# Patient Record
Sex: Female | Born: 1935 | Race: White | Hispanic: No | Marital: Married | Smoking: Former smoker
Health system: Southern US, Community
[De-identification: ages and names within clinical notes are randomized; demographics above are authoritative.]

## PROBLEM LIST (undated history)

## (undated) DIAGNOSIS — J45909 Unspecified asthma, uncomplicated: Secondary | ICD-10-CM

## (undated) DIAGNOSIS — Z923 Personal history of irradiation: Secondary | ICD-10-CM

## (undated) DIAGNOSIS — J449 Chronic obstructive pulmonary disease, unspecified: Secondary | ICD-10-CM

## (undated) DIAGNOSIS — F32A Depression, unspecified: Secondary | ICD-10-CM

## (undated) DIAGNOSIS — F419 Anxiety disorder, unspecified: Secondary | ICD-10-CM

## (undated) DIAGNOSIS — F329 Major depressive disorder, single episode, unspecified: Secondary | ICD-10-CM

## (undated) DIAGNOSIS — C50919 Malignant neoplasm of unspecified site of unspecified female breast: Secondary | ICD-10-CM

## (undated) HISTORY — PX: CHOLECYSTECTOMY: SHX55

## (undated) HISTORY — DX: Chronic obstructive pulmonary disease, unspecified: J44.9

## (undated) HISTORY — DX: Anxiety disorder, unspecified: F41.9

## (undated) HISTORY — DX: Depression, unspecified: F32.A

## (undated) HISTORY — DX: Major depressive disorder, single episode, unspecified: F32.9

## (undated) HISTORY — DX: Unspecified asthma, uncomplicated: J45.909

## (undated) HISTORY — PX: ABDOMINAL HYSTERECTOMY: SHX81

## (undated) HISTORY — PX: BACK SURGERY: SHX140

## (undated) HISTORY — PX: COLONOSCOPY: SHX174

## (undated) HISTORY — PX: APPENDECTOMY: SHX54

---

## 1973-01-21 HISTORY — PX: BREAST BIOPSY: SHX20

## 1973-01-21 HISTORY — PX: BREAST LUMPECTOMY: SHX2

## 1973-01-21 HISTORY — PX: BREAST EXCISIONAL BIOPSY: SUR124

## 2013-01-21 HISTORY — PX: BREAST BIOPSY: SHX20

## 2013-01-21 HISTORY — PX: BREAST LUMPECTOMY: SHX2

## 2013-11-02 DIAGNOSIS — C50411 Malignant neoplasm of upper-outer quadrant of right female breast: Secondary | ICD-10-CM | POA: Insufficient documentation

## 2013-11-02 DIAGNOSIS — C50911 Malignant neoplasm of unspecified site of right female breast: Secondary | ICD-10-CM | POA: Insufficient documentation

## 2013-11-02 DIAGNOSIS — Z853 Personal history of malignant neoplasm of breast: Secondary | ICD-10-CM | POA: Insufficient documentation

## 2016-06-10 DIAGNOSIS — Z87898 Personal history of other specified conditions: Secondary | ICD-10-CM | POA: Insufficient documentation

## 2016-06-10 DIAGNOSIS — R933 Abnormal findings on diagnostic imaging of other parts of digestive tract: Secondary | ICD-10-CM | POA: Insufficient documentation

## 2016-06-10 DIAGNOSIS — R634 Abnormal weight loss: Secondary | ICD-10-CM | POA: Insufficient documentation

## 2016-07-02 DIAGNOSIS — E7800 Pure hypercholesterolemia, unspecified: Secondary | ICD-10-CM | POA: Insufficient documentation

## 2016-07-02 DIAGNOSIS — K219 Gastro-esophageal reflux disease without esophagitis: Secondary | ICD-10-CM | POA: Insufficient documentation

## 2016-07-02 DIAGNOSIS — J449 Chronic obstructive pulmonary disease, unspecified: Secondary | ICD-10-CM | POA: Insufficient documentation

## 2016-07-02 DIAGNOSIS — E039 Hypothyroidism, unspecified: Secondary | ICD-10-CM | POA: Insufficient documentation

## 2016-07-02 DIAGNOSIS — F325 Major depressive disorder, single episode, in full remission: Secondary | ICD-10-CM | POA: Insufficient documentation

## 2016-07-02 DIAGNOSIS — M5416 Radiculopathy, lumbar region: Secondary | ICD-10-CM | POA: Insufficient documentation

## 2016-07-02 DIAGNOSIS — M8589 Other specified disorders of bone density and structure, multiple sites: Secondary | ICD-10-CM | POA: Insufficient documentation

## 2016-07-02 DIAGNOSIS — G8929 Other chronic pain: Secondary | ICD-10-CM | POA: Insufficient documentation

## 2016-07-16 ENCOUNTER — Inpatient Hospital Stay: Payer: Medicare Other

## 2016-07-16 ENCOUNTER — Inpatient Hospital Stay: Payer: Medicare Other | Attending: Oncology | Admitting: Oncology

## 2016-07-16 ENCOUNTER — Encounter: Payer: Self-pay | Admitting: Oncology

## 2016-07-16 ENCOUNTER — Encounter (INDEPENDENT_AMBULATORY_CARE_PROVIDER_SITE_OTHER): Payer: Self-pay

## 2016-07-16 VITALS — BP 130/71 | HR 83 | Temp 97.8°F | Resp 18 | Ht 60.0 in | Wt 118.2 lb

## 2016-07-16 DIAGNOSIS — K219 Gastro-esophageal reflux disease without esophagitis: Secondary | ICD-10-CM | POA: Diagnosis not present

## 2016-07-16 DIAGNOSIS — C50911 Malignant neoplasm of unspecified site of right female breast: Secondary | ICD-10-CM

## 2016-07-16 DIAGNOSIS — Z170 Estrogen receptor positive status [ER+]: Principal | ICD-10-CM

## 2016-07-16 DIAGNOSIS — Z8781 Personal history of (healed) traumatic fracture: Secondary | ICD-10-CM | POA: Diagnosis not present

## 2016-07-16 DIAGNOSIS — Z87891 Personal history of nicotine dependence: Secondary | ICD-10-CM | POA: Diagnosis not present

## 2016-07-16 DIAGNOSIS — M8580 Other specified disorders of bone density and structure, unspecified site: Secondary | ICD-10-CM

## 2016-07-16 DIAGNOSIS — Z8719 Personal history of other diseases of the digestive system: Secondary | ICD-10-CM | POA: Insufficient documentation

## 2016-07-16 DIAGNOSIS — E039 Hypothyroidism, unspecified: Secondary | ICD-10-CM | POA: Diagnosis not present

## 2016-07-16 DIAGNOSIS — F419 Anxiety disorder, unspecified: Secondary | ICD-10-CM | POA: Diagnosis not present

## 2016-07-16 DIAGNOSIS — Z79899 Other long term (current) drug therapy: Secondary | ICD-10-CM | POA: Diagnosis not present

## 2016-07-16 DIAGNOSIS — Z923 Personal history of irradiation: Secondary | ICD-10-CM | POA: Insufficient documentation

## 2016-07-16 DIAGNOSIS — R634 Abnormal weight loss: Secondary | ICD-10-CM | POA: Diagnosis not present

## 2016-07-16 DIAGNOSIS — J449 Chronic obstructive pulmonary disease, unspecified: Secondary | ICD-10-CM | POA: Diagnosis not present

## 2016-07-16 DIAGNOSIS — M5416 Radiculopathy, lumbar region: Secondary | ICD-10-CM | POA: Diagnosis not present

## 2016-07-16 DIAGNOSIS — Z79810 Long term (current) use of selective estrogen receptor modulators (SERMs): Secondary | ICD-10-CM | POA: Diagnosis not present

## 2016-07-16 LAB — CBC WITH DIFFERENTIAL/PLATELET
BASOS ABS: 0 10*3/uL (ref 0–0.1)
BASOS PCT: 0 %
EOS ABS: 0 10*3/uL (ref 0–0.7)
Eosinophils Relative: 0 %
HCT: 37.7 % (ref 35.0–47.0)
HEMOGLOBIN: 12.9 g/dL (ref 12.0–16.0)
LYMPHS ABS: 1.3 10*3/uL (ref 1.0–3.6)
Lymphocytes Relative: 13 %
MCH: 32.4 pg (ref 26.0–34.0)
MCHC: 34.3 g/dL (ref 32.0–36.0)
MCV: 94.7 fL (ref 80.0–100.0)
Monocytes Absolute: 0.8 10*3/uL (ref 0.2–0.9)
Monocytes Relative: 8 %
NEUTROS PCT: 79 %
Neutro Abs: 7.9 10*3/uL — ABNORMAL HIGH (ref 1.4–6.5)
Platelets: 291 10*3/uL (ref 150–440)
RBC: 3.98 MIL/uL (ref 3.80–5.20)
RDW: 13.6 % (ref 11.5–14.5)
WBC: 10.2 10*3/uL (ref 3.6–11.0)

## 2016-07-16 LAB — COMPREHENSIVE METABOLIC PANEL
ALBUMIN: 4.2 g/dL (ref 3.5–5.0)
ALK PHOS: 42 U/L (ref 38–126)
ALT: 19 U/L (ref 14–54)
AST: 28 U/L (ref 15–41)
Anion gap: 9 (ref 5–15)
BUN: 21 mg/dL — ABNORMAL HIGH (ref 6–20)
CALCIUM: 9.1 mg/dL (ref 8.9–10.3)
CHLORIDE: 102 mmol/L (ref 101–111)
CO2: 26 mmol/L (ref 22–32)
CREATININE: 0.9 mg/dL (ref 0.44–1.00)
GFR calc Af Amer: >60 mL/min (ref >60)
GFR calc non Af Amer: 59 mL/min — ABNORMAL LOW (ref >60)
GLUCOSE: 99 mg/dL (ref 65–99)
Potassium: 4.2 mmol/L (ref 3.5–5.1)
SODIUM: 137 mmol/L (ref 135–145)
Total Bilirubin: 0.5 mg/dL (ref 0.3–1.2)
Total Protein: 7.4 g/dL (ref 6.5–8.1)

## 2016-07-16 NOTE — Progress Notes (Signed)
Patient here today as new evaluation regarding weight loss.  Referred by Dawson Bills, [facility].

## 2016-07-16 NOTE — Progress Notes (Signed)
Hematology/Oncology Consult note [facility] Telephone:([phone] [phone]  Patient Care Team: Glendon Axe, MD as PCP - General (Internal Medicine)   Name of the patient: Julie Mckinney  [id number]  1934/06/21    Reason for referral- history of breast cancer and abnormal weight loss   Referring physician- Dawson Bills NP  Date of visit: 07/16/16   History of presenting illness- 1. Patient is a 82 year old female with a history of invasive lobular carcinoma of the right breast diagnosed in 2015 lumpectomy and adjuvant radiation therapy. She was found to have a 6 x 5 x 10 mm mass in her right breast on core biopsy that was ER/PR positive and HER-2/neu negative. She underwent lumpectomy and sentinel lymph node biopsy in November 2015 which showed T2 N0 invasive lobular carcinoma measuring 2.6 cm, low-grade. 0 out of 4 sentinel lymph nodes were involved. Focally positive margin with involvement of the pectoralis muscle alone. Reexcision was not pursued and she received radiation boost to this area. She was subsequently started on hormone therapy with letrozole but had problems tolerating it due to myalgias and arthralgias and is currently on tamoxifen. She has had osteopenia in the past and has had nontraumatic bilateral were fractures despite taking Fosamax. Last mammogram from 2017 was reportedly normal and she is due for one this year. She had bone density scan yesterday and I do not have those results with me today  2. She recently moved here from [place] and notes that sometime in April 2018 she was in the hospital with severe diarrhea. She didn't know was weight from 135 pounds to 115 pounds. She is now back up to 118 pounds and is slowly trying to gain weight. She has been seen at [facility] GI for the same and is scheduled to undergo EGD and colonoscopy next month. She also had a recent CT abdomen (I do not have those results with me to review)  but notes from Dr. Durenda Age anxiety and that she had diverticulosis noted on CT abdomen but no evidence of malignancy.  3. Patient also has a history of COPD due to prior smoking but is not currently on home oxygen. She is independent of her ADLs and IADLs  ECOG PS- 0  Pain scale- 0   Review of systems- Review of Systems  Constitutional: Negative for chills, fever, malaise/fatigue and weight loss.  HENT: Negative for congestion, ear discharge and nosebleeds.   Eyes: Negative for blurred vision.  Respiratory: Negative for cough, hemoptysis, sputum production, shortness of breath and wheezing.   Cardiovascular: Negative for chest pain, palpitations, orthopnea and claudication.  Gastrointestinal: Negative for abdominal pain, blood in stool, constipation, diarrhea, heartburn, melena, nausea and vomiting.  Genitourinary: Negative for dysuria, flank pain, frequency, hematuria and urgency.  Musculoskeletal: Negative for back pain, joint pain and myalgias.  Skin: Negative for rash.  Neurological: Negative for dizziness, tingling, focal weakness, seizures, weakness and headaches.  Endo/Heme/Allergies: Does not bruise/bleed easily.  Psychiatric/Behavioral: Negative for depression and suicidal ideas. The patient does not have insomnia.     Allergies  Allergen Reactions  . Tape Rash    Patient Active Problem List   Diagnosis Date Noted  . Acquired hypothyroidism 07/02/2016  . Chronic obstructive pulmonary disease ([place]) 07/02/2016  . Depression, major, single episode, complete remission ([place]) 07/02/2016  . Gastroesophageal reflux disease without esophagitis 07/02/2016  . Lumbar radiculopathy 07/02/2016  . Osteopenia of multiple sites 07/02/2016  . History of diarrhea 06/10/2016  . Weight loss, abnormal  06/10/2016     Past Medical History:  Diagnosis Date  . Asthma   . COPD (chronic obstructive pulmonary disease) ([place])      Past Surgical History:  Procedure Laterality Date  .  ABDOMINAL HYSTERECTOMY    . APPENDECTOMY    . BREAST LUMPECTOMY  1975  . CHOLECYSTECTOMY    . COLONOSCOPY     Scheduled for July 2018    Social History   Social History  . Marital status: Married    Spouse name: N/A  . Number of children: N/A  . Years of education: N/A   Occupational History  . Not on file.   Social History Main Topics  . Smoking status: Former Smoker    Packs/day: 0.50    Years: 30.00    Types: Cigarettes    Quit date: 06/22/1995  . Smokeless tobacco: Not on file  . Alcohol use Yes     Comment: Occasional  . Drug use: No  . Sexual activity: Not on file   Other Topics Concern  . Not on file   Social History Narrative  . No narrative on file     Family History  Problem Relation Age of Onset  . Cancer Father      Current Outpatient Prescriptions:  .  albuterol (PROVENTIL HFA;VENTOLIN HFA) 108 (90 Base) MCG/ACT inhaler, Inhale 2 puffs into the lungs every 6 (six) hours as needed., Disp: , Rfl:  .  calcium carbonate (CALCIUM 600) 600 MG TABS tablet, Take 600 mg by mouth 2 (two) times daily., Disp: , Rfl:  .  Cholecalciferol (VITAMIN D3) 1000 units CAPS, Take 1,000 mg by mouth daily., Disp: , Rfl:  .  diclofenac (VOLTAREN) 75 MG EC tablet, Take 75 mg by mouth 2 (two) times daily., Disp: , Rfl:  .  fluticasone (FLONASE) 50 MCG/ACT nasal spray, Place 1 spray into the nose as needed., Disp: , Rfl:  .  Fluticasone Furoate-Vilanterol (BREO ELLIPTA IN), Inhale 1 puff into the lungs daily., Disp: , Rfl:  .  GLUCOSAMINE SULFATE PO, Take 1 capsule by mouth 2 (two) times daily., Disp: , Rfl:  .  levothyroxine (SYNTHROID, LEVOTHROID) 50 MCG tablet, Take 50 mcg by mouth daily., Disp: , Rfl:  .  meclizine (ANTIVERT) 12.5 MG tablet, Take 12.5 mg by mouth at bedtime., Disp: , Rfl:  .  mirtazapine (REMERON) 45 MG tablet, Take 45 mg by mouth at bedtime., Disp: , Rfl:  .  Multiple Vitamin (MULTI-VITAMINS) TABS, Take 1 tablet by mouth daily., Disp: , Rfl:  .  omeprazole  (PRILOSEC) 20 MG capsule, Take 20 mg by mouth daily., Disp: , Rfl:  .  simvastatin (ZOCOR) 80 MG tablet, Take 80 mg by mouth daily., Disp: , Rfl:  .  tamoxifen (NOLVADEX) 20 MG tablet, Take 20 mg by mouth daily., Disp: , Rfl:  .  tiotropium (SPIRIVA) 18 MCG inhalation capsule, Place 1 capsule into inhaler and inhale as needed., Disp: , Rfl:  .  traMADol (ULTRAM) 50 MG tablet, Take 5 mg by mouth as needed., Disp: , Rfl:  .  Venlafaxine HCl 225 MG TB24, Take 2.25 mg by mouth 2 (two) times daily., Disp: , Rfl:    Physical exam:  Vitals:   07/16/16 1100  BP: 130/71  Pulse: 83  Resp: 18  Temp: 97.8 F (36.6 C)  TempSrc: Tympanic  Weight: 118 lb 4 oz (53.6 kg)  Height: 5' (1.524 m)   Physical Exam  Constitutional: She is oriented to person, place, and time  and well-developed, well-nourished, and in no distress.  HENT:  Head: Normocephalic and atraumatic.  Eyes: EOM are normal. Pupils are equal, round, and reactive to light.  Neck: Normal range of motion.  Cardiovascular: Normal rate, regular rhythm and normal heart sounds.   Pulmonary/Chest: Effort normal and breath sounds normal.  Abdominal: Soft. Bowel sounds are normal.  Musculoskeletal: She exhibits edema (Trace bilateral).  Neurological: She is alert and oriented to person, place, and time.  Skin: Skin is warm and dry.   Breast exam was performed in seated and lying down position. Patient is status post right lumpectomy with a well-healed surgical scar. No evidence of any palpable masses. No evidence of axillary adenopathy. No evidence of any palpable masses or lumps in the left breast. No evidence of leftt axillary adenopathy     CMP Latest Ref Rng & Units 07/16/2016  Glucose 65 - 99 mg/dL 99  BUN 6 - 20 mg/dL 21(H)  Creatinine 0.44 - 1.00 mg/dL 0.90  Sodium 135 - 145 mmol/L 137  Potassium 3.5 - 5.1 mmol/L 4.2  Chloride 101 - 111 mmol/L 102  CO2 22 - 32 mmol/L 26  Calcium 8.9 - 10.3 mg/dL 9.1  Total Protein 6.5 - 8.1  g/dL 7.4  Total Bilirubin 0.3 - 1.2 mg/dL 0.5  Alkaline Phos 38 - 126 U/L 42  AST 15 - 41 U/L 28  ALT 14 - 54 U/L 19   CBC Latest Ref Rng & Units 07/16/2016  WBC 3.6 - 11.0 K/uL 10.2  Hemoglobin 12.0 - 16.0 g/dL 12.9  Hematocrit 35.0 - 47.0 % 37.7  Platelets 150 - 440 K/uL 291     Assessment and plan- Patient is a 82 y.o. female with a history of right breast cancer in 2015 status post lumpectomy and radiation and currently on tamoxifen  1. Right breast cancer status post lumpectomy and radiation and currently on tamoxifen: She will continue tamoxifen for total period of 5 years ending in 2021. We will obtain results of bone density scan done yesterday. She is also status post hysterectomy and is not an increased risk of uterine cancer. We will schedule a mammogram at this time  2. Abnormal weight loss: Patient reports that her diarrhea has now resolved and she is slowly regaining her weight. She denies any overt fatigue and clinically there is no evidence of recurrence on today's exam. I will check a CBC and CMP and await her colonoscopy and EGD next month. I will see her back in 3 months time. If she continues to lose weight or does not regain her lost weight I will consider doing a CT thorax without contrast given her history of COPD in the past to rule out any malignancy. Recent CT abdomen did not show any evidence of malignancy  > 30 min spent in reviewing outside patient notes on breast cancer and recent GI evaluation   Thank you for this kind referral and the opportunity to participate in the care of this patient   Visit Diagnosis 1. Malignant neoplasm of right breast in female, estrogen receptor positive, unspecified site of breast [facility])     Dr. Randa Evens, MD, MPH [facility] Pager- [phone] 07/16/2016 1:04 PM

## 2016-07-17 ENCOUNTER — Other Ambulatory Visit: Payer: Self-pay | Admitting: *Deleted

## 2016-07-17 DIAGNOSIS — C50911 Malignant neoplasm of unspecified site of right female breast: Secondary | ICD-10-CM

## 2016-07-17 DIAGNOSIS — Z170 Estrogen receptor positive status [ER+]: Principal | ICD-10-CM

## 2016-07-22 ENCOUNTER — Inpatient Hospital Stay
Admission: RE | Admit: 2016-07-22 | Discharge: 2016-07-22 | Disposition: A | Discharge status: Home / Self Care | Payer: Self-pay | Source: Ambulatory Visit | Attending: *Deleted | Admitting: *Deleted

## 2016-07-22 ENCOUNTER — Other Ambulatory Visit: Payer: Self-pay | Admitting: *Deleted

## 2016-07-22 DIAGNOSIS — Z9289 Personal history of other medical treatment: Secondary | ICD-10-CM

## 2016-07-29 ENCOUNTER — Ambulatory Visit
Admission: RE | Admit: 2016-07-29 | Discharge: 2016-07-29 | Disposition: A | Discharge status: Home / Self Care | Payer: Medicare Other | Source: Ambulatory Visit | Attending: Oncology | Admitting: Oncology

## 2016-07-29 ENCOUNTER — Encounter (HOSPITAL_COMMUNITY): Payer: Self-pay

## 2016-07-29 DIAGNOSIS — C50911 Malignant neoplasm of unspecified site of right female breast: Secondary | ICD-10-CM

## 2016-07-29 DIAGNOSIS — Z853 Personal history of malignant neoplasm of breast: Secondary | ICD-10-CM | POA: Insufficient documentation

## 2016-07-29 DIAGNOSIS — Z923 Personal history of irradiation: Secondary | ICD-10-CM | POA: Insufficient documentation

## 2016-07-29 DIAGNOSIS — Z170 Estrogen receptor positive status [ER+]: Secondary | ICD-10-CM

## 2016-07-29 HISTORY — DX: Malignant neoplasm of unspecified site of unspecified female breast: C50.919

## 2016-07-29 HISTORY — DX: Personal history of irradiation: Z92.3

## 2016-08-13 ENCOUNTER — Encounter: Payer: Self-pay | Admitting: *Deleted

## 2016-08-14 ENCOUNTER — Ambulatory Visit
Admission: RE | Admit: 2016-08-14 | Discharge: 2016-08-14 | Disposition: A | Discharge status: Home / Self Care | Payer: Medicare Other | Source: Ambulatory Visit | Attending: Unknown Physician Specialty | Admitting: Unknown Physician Specialty

## 2016-08-14 ENCOUNTER — Encounter: Payer: Self-pay | Admitting: Anesthesiology

## 2016-08-14 ENCOUNTER — Ambulatory Visit: Payer: Medicare Other | Admitting: Anesthesiology

## 2016-08-14 ENCOUNTER — Encounter
Admission: RE | Disposition: A | Discharge status: Home / Self Care | Payer: Self-pay | Source: Ambulatory Visit | Attending: Unknown Physician Specialty

## 2016-08-14 DIAGNOSIS — Z79899 Other long term (current) drug therapy: Secondary | ICD-10-CM | POA: Insufficient documentation

## 2016-08-14 DIAGNOSIS — K449 Diaphragmatic hernia without obstruction or gangrene: Secondary | ICD-10-CM | POA: Diagnosis not present

## 2016-08-14 DIAGNOSIS — K5730 Diverticulosis of large intestine without perforation or abscess without bleeding: Secondary | ICD-10-CM | POA: Diagnosis not present

## 2016-08-14 DIAGNOSIS — K219 Gastro-esophageal reflux disease without esophagitis: Secondary | ICD-10-CM | POA: Diagnosis not present

## 2016-08-14 DIAGNOSIS — K5520 Angiodysplasia of colon without hemorrhage: Secondary | ICD-10-CM | POA: Insufficient documentation

## 2016-08-14 DIAGNOSIS — Z9049 Acquired absence of other specified parts of digestive tract: Secondary | ICD-10-CM | POA: Insufficient documentation

## 2016-08-14 DIAGNOSIS — F329 Major depressive disorder, single episode, unspecified: Secondary | ICD-10-CM | POA: Insufficient documentation

## 2016-08-14 DIAGNOSIS — K2950 Unspecified chronic gastritis without bleeding: Secondary | ICD-10-CM | POA: Diagnosis not present

## 2016-08-14 DIAGNOSIS — Z853 Personal history of malignant neoplasm of breast: Secondary | ICD-10-CM | POA: Insufficient documentation

## 2016-08-14 DIAGNOSIS — M8580 Other specified disorders of bone density and structure, unspecified site: Secondary | ICD-10-CM | POA: Diagnosis not present

## 2016-08-14 DIAGNOSIS — E039 Hypothyroidism, unspecified: Secondary | ICD-10-CM | POA: Diagnosis not present

## 2016-08-14 DIAGNOSIS — Z923 Personal history of irradiation: Secondary | ICD-10-CM | POA: Insufficient documentation

## 2016-08-14 DIAGNOSIS — K31819 Angiodysplasia of stomach and duodenum without bleeding: Secondary | ICD-10-CM | POA: Insufficient documentation

## 2016-08-14 DIAGNOSIS — Z8042 Family history of malignant neoplasm of prostate: Secondary | ICD-10-CM | POA: Insufficient documentation

## 2016-08-14 DIAGNOSIS — Z801 Family history of malignant neoplasm of trachea, bronchus and lung: Secondary | ICD-10-CM | POA: Insufficient documentation

## 2016-08-14 DIAGNOSIS — Z6822 Body mass index (BMI) 22.0-22.9, adult: Secondary | ICD-10-CM | POA: Insufficient documentation

## 2016-08-14 DIAGNOSIS — Z87891 Personal history of nicotine dependence: Secondary | ICD-10-CM | POA: Diagnosis not present

## 2016-08-14 DIAGNOSIS — K640 First degree hemorrhoids: Secondary | ICD-10-CM | POA: Diagnosis not present

## 2016-08-14 DIAGNOSIS — Z820 Family history of epilepsy and other diseases of the nervous system: Secondary | ICD-10-CM | POA: Diagnosis not present

## 2016-08-14 DIAGNOSIS — Z90710 Acquired absence of both cervix and uterus: Secondary | ICD-10-CM | POA: Insufficient documentation

## 2016-08-14 DIAGNOSIS — R197 Diarrhea, unspecified: Secondary | ICD-10-CM | POA: Insufficient documentation

## 2016-08-14 DIAGNOSIS — E7800 Pure hypercholesterolemia, unspecified: Secondary | ICD-10-CM | POA: Diagnosis not present

## 2016-08-14 DIAGNOSIS — R634 Abnormal weight loss: Secondary | ICD-10-CM | POA: Insufficient documentation

## 2016-08-14 DIAGNOSIS — M5416 Radiculopathy, lumbar region: Secondary | ICD-10-CM | POA: Insufficient documentation

## 2016-08-14 DIAGNOSIS — J449 Chronic obstructive pulmonary disease, unspecified: Secondary | ICD-10-CM | POA: Diagnosis not present

## 2016-08-14 HISTORY — PX: COLONOSCOPY WITH PROPOFOL: SHX5780

## 2016-08-14 HISTORY — PX: ESOPHAGOGASTRODUODENOSCOPY (EGD) WITH PROPOFOL: SHX5813

## 2016-08-14 SURGERY — ESOPHAGOGASTRODUODENOSCOPY (EGD) WITH PROPOFOL
Anesthesia: General

## 2016-08-14 MED ORDER — LIDOCAINE HCL (PF) 2 % IJ SOLN
INTRAMUSCULAR | Status: AC
  Filled 2016-08-14: qty 4

## 2016-08-14 MED ORDER — FENTANYL CITRATE (PF) 100 MCG/2ML IJ SOLN
INTRAMUSCULAR | Status: AC
  Filled 2016-08-14: qty 2

## 2016-08-14 MED ORDER — PROPOFOL 10 MG/ML IV BOLUS
INTRAVENOUS | Status: DC | PRN
  Administered 2016-08-14: 80 mg via INTRAVENOUS

## 2016-08-14 MED ORDER — PROPOFOL 10 MG/ML IV BOLUS
INTRAVENOUS | Status: AC
  Filled 2016-08-14: qty 20

## 2016-08-14 MED ORDER — PHENYLEPHRINE HCL 10 MG/ML IJ SOLN
INTRAMUSCULAR | Status: DC | PRN
  Administered 2016-08-14 (×2): 100 ug via INTRAVENOUS

## 2016-08-14 MED ORDER — MIDAZOLAM HCL 5 MG/5ML IJ SOLN
INTRAMUSCULAR | Status: DC | PRN
  Administered 2016-08-14: 1 mg via INTRAVENOUS

## 2016-08-14 MED ORDER — MIDAZOLAM HCL 2 MG/2ML IJ SOLN
INTRAMUSCULAR | Status: AC
  Filled 2016-08-14: qty 2

## 2016-08-14 MED ORDER — LIDOCAINE 2% (20 MG/ML) 5 ML SYRINGE
INTRAMUSCULAR | Status: DC | PRN
  Administered 2016-08-14: 30 mg via INTRAVENOUS

## 2016-08-14 MED ORDER — SODIUM CHLORIDE 0.9 % IV SOLN: INTRAVENOUS | Status: DC

## 2016-08-14 MED ORDER — SODIUM CHLORIDE 0.9 % IV SOLN
INTRAVENOUS | Status: DC
  Administered 2016-08-14: 1000 mL via INTRAVENOUS
  Administered 2016-08-14: 11:00:00 via INTRAVENOUS

## 2016-08-14 MED ORDER — PROPOFOL 500 MG/50ML IV EMUL
INTRAVENOUS | Status: DC | PRN
  Administered 2016-08-14: 140 ug/kg/min via INTRAVENOUS

## 2016-08-14 MED ORDER — FENTANYL CITRATE (PF) 100 MCG/2ML IJ SOLN
INTRAMUSCULAR | Status: DC | PRN
  Administered 2016-08-14: 50 ug via INTRAVENOUS

## 2016-08-14 NOTE — Transfer of Care (Signed)
Immediate Anesthesia Transfer of Care Note  Patient: Julie Mckinney  Procedure(s) Performed: Procedure(s): ESOPHAGOGASTRODUODENOSCOPY (EGD) WITH PROPOFOL (N/A) COLONOSCOPY WITH PROPOFOL (N/A)  Patient Location: PACU and Endoscopy Unit  Anesthesia Type:General  Level of Consciousness: awake and patient cooperative  Airway & Oxygen Therapy: Patient Spontanous Breathing and Patient connected to nasal cannula oxygen  Post-op Assessment: Report given to RN and Post -op Vital signs reviewed and stable  Post vital signs: Reviewed and stable  Last Vitals:  Vitals:   08/14/16 1019  BP: 118/67  Pulse: 72  Resp: 16  Temp: 36.7 C    Last Pain: There were no vitals filed for this visit.       Complications: No apparent anesthesia complications

## 2016-08-14 NOTE — Op Note (Signed)
[facility] Gastroenterology Patient Name: Julie Mckinney Procedure Date: 08/14/2016 10:53 AM MRN: [id number] Account #: [id number] Date of Birth: [birth year] Admit Type: Outpatient Age: 82 Room: [facility] ENDO ROOM 3 Gender: Female Note Status: Finalized Procedure:            Upper GI endoscopy Indications:          Weight loss Providers:            Manya Silvas, MD Referring MD:         Glendon Axe (Referring MD) Medicines:            Propofol per Anesthesia Complications:        No immediate complications. Procedure:            Pre-Anesthesia Assessment:                       - After reviewing the risks and benefits, the patient                        was deemed in satisfactory condition to undergo the                        procedure.                       After obtaining informed consent, the endoscope was                        passed under direct vision. Throughout the procedure,                        the patient's blood pressure, pulse, and oxygen                        saturations were monitored continuously. The Endoscope                        was introduced through the mouth, and advanced to the                        second part of duodenum. The upper GI endoscopy was                        accomplished without difficulty. The patient tolerated                        the procedure well. Findings:      The examined esophagus was normal.      A small hiatal hernia was present.      The stomach was normal.      Localized mild inflammation characterized by erythema and granularity       was found in the gastric antrum. Biopsies were taken with a cold forceps       for histology. Biopsies were taken with a cold forceps for Helicobacter       pylori testing.      A single medium angioectasia without bleeding was found in the second       portion of the duodenum. Coagulation for tissue destruction using argon       plasma at 0.4 liters/minute and 20  watts was successful. Impression:           -  Normal esophagus.                       - Small hiatal hernia.                       - Normal stomach.                       - Gastritis. Biopsied.                       - A single non-bleeding angioectasia in the duodenum.                        Treated with argon plasma coagulation (APC). Recommendation:       - Await pathology results. Manya Silvas, MD 08/14/2016 11:22:28 AM This report has been signed electronically. Number of Addenda: 0 Note Initiated On: 08/14/2016 10:53 AM      [facility]

## 2016-08-14 NOTE — Anesthesia Postprocedure Evaluation (Signed)
Anesthesia Post Note  Patient: Julie Mckinney  Procedure(s) Performed: Procedure(s) (LRB): ESOPHAGOGASTRODUODENOSCOPY (EGD) WITH PROPOFOL (N/A) COLONOSCOPY WITH PROPOFOL (N/A)  Patient location during evaluation: Endoscopy Anesthesia Type: General Level of consciousness: awake and alert Pain management: pain level controlled Vital Signs Assessment: post-procedure vital signs reviewed and stable Respiratory status: spontaneous breathing, nonlabored ventilation, respiratory function stable and patient connected to nasal cannula oxygen Cardiovascular status: blood pressure returned to baseline and stable Postop Assessment: no signs of nausea or vomiting Anesthetic complications: no     Last Vitals:  Vitals:   08/14/16 1200 08/14/16 1210  BP: (!) 111/52 (!) 116/57  Pulse: 60   Resp: (!) 21   Temp:      Last Pain:  Vitals:   08/14/16 1151  TempSrc: Tympanic                 [name]

## 2016-08-14 NOTE — H&P (Signed)
Primary Care Physician:  Glendon Axe, MD Primary Gastroenterologist:  Dr. Vira Agar  Pre-Procedure History & Physical: HPI:  Julie Mckinney is a 82 y.o. female is here for an endoscopy and colonoscopy.   Past Medical History:  Diagnosis Date  . Asthma   . Breast cancer ([place]) Right  . COPD (chronic obstructive pulmonary disease) ([place])   . Personal history of radiation therapy     Past Surgical History:  Procedure Laterality Date  . ABDOMINAL HYSTERECTOMY    . APPENDECTOMY    . BREAST BIOPSY Right 2015   +  . BREAST BIOPSY Right 1975   neg  . BREAST LUMPECTOMY  1975  . CHOLECYSTECTOMY    . COLONOSCOPY     Scheduled for July 2018    Prior to Admission medications   Medication Sig Start Date End Date Taking? Authorizing Provider  albuterol (PROVENTIL HFA;VENTOLIN HFA) 108 (90 Base) MCG/ACT inhaler Inhale 2 puffs into the lungs every 6 (six) hours as needed.   Yes [provider]  calcium carbonate (CALCIUM 600) 600 MG TABS tablet Take 600 mg by mouth 2 (two) times daily.   Yes [provider]  Cholecalciferol (VITAMIN D3) 1000 units CAPS Take 1,000 mg by mouth daily.   Yes [provider]  diclofenac (VOLTAREN) 75 MG EC tablet Take 75 mg by mouth 2 (two) times daily.   Yes [provider]  fluticasone (FLONASE) 50 MCG/ACT nasal spray Place 1 spray into the nose as needed.   Yes [provider]  Fluticasone Furoate-Vilanterol (BREO ELLIPTA IN) Inhale 1 puff into the lungs daily.   Yes [provider]  GLUCOSAMINE SULFATE PO Take 1 capsule by mouth 2 (two) times daily.   Yes [provider]  levothyroxine (SYNTHROID, LEVOTHROID) 50 MCG tablet Take 50 mcg by mouth daily.   Yes [provider]  meclizine (ANTIVERT) 12.5 MG tablet Take 12.5 mg by mouth at bedtime. 03/21/16  Yes [provider]  mirtazapine (REMERON) 45 MG tablet Take 45 mg by mouth at bedtime.   Yes [provider]  Multiple  Vitamin (MULTI-VITAMINS) TABS Take 1 tablet by mouth daily.   Yes [provider]  omeprazole (PRILOSEC) 20 MG capsule Take 20 mg by mouth daily.   Yes [provider]  simvastatin (ZOCOR) 80 MG tablet Take 80 mg by mouth daily.   Yes [provider]  tamoxifen (NOLVADEX) 20 MG tablet Take 20 mg by mouth daily.   Yes [provider]  tiotropium (SPIRIVA) 18 MCG inhalation capsule Place 1 capsule into inhaler and inhale as needed.   Yes [provider]  traMADol (ULTRAM) 50 MG tablet Take 5 mg by mouth as needed.   Yes [provider]  Venlafaxine HCl 225 MG TB24 Take 2.25 mg by mouth 2 (two) times daily.   Yes [provider]    Allergies as of 07/03/2016  . (Not on File)    Family History  Problem Relation Age of Onset  . Cancer Father   . Breast cancer Neg Hx     Social History   Social History  . Marital status: Married    Spouse name: N/A  . Number of children: N/A  . Years of education: N/A   Occupational History  . Not on file.   Social History Main Topics  . Smoking status: Former Smoker    Packs/day: 0.50    Years: 30.00    Types: Cigarettes    Quit date: 06/22/1995  .  Smokeless tobacco: Never Used  . Alcohol use Yes     Comment: Occasional  . Drug use: No  . Sexual activity: Not on file   Other Topics Concern  . Not on file   Social History Narrative  . No narrative on file    Review of Systems: See HPI, otherwise negative ROS  Physical Exam: BP 118/67   Pulse 72   Temp 98 F (36.7 C)   Resp 16   Ht 5' (1.524 m)   Wt 52.2 kg (115 lb)   SpO2 100%   BMI 22.46 kg/m  General:   Alert,  pleasant and cooperative in NAD Head:  Normocephalic and atraumatic. Neck:  Supple; no masses or thyromegaly. Lungs:  Clear throughout to auscultation.    Heart:  Regular rate and rhythm. Abdomen:  Soft, nontender and nondistended. Normal bowel sounds, without guarding, and without rebound.    Neurologic:  Alert and  oriented x4;  grossly normal neurologically.  Impression/Plan: Julie Mckinney is here for an endoscopy and colonoscopy to be performed for Evaluation of abnormal CT scan with sigmoid wall thickening, also weight loss.  Risks, benefits, limitations, and alternatives regarding  endoscopy and colonoscopy have been reviewed with the patient.  Questions have been answered.  All parties agreeable.   Gaylyn Cheers, MD  08/14/2016, 10:49 AM

## 2016-08-14 NOTE — Anesthesia Preprocedure Evaluation (Signed)
Anesthesia Evaluation  Patient identified by MRN, date of birth, ID band Patient awake    Reviewed: Allergy & Precautions, H&P , NPO status , Patient's Chart, lab work & pertinent test results, reviewed documented beta blocker date and time   History of Anesthesia Complications Negative for: history of anesthetic complications  Airway Mallampati: I  TM Distance: >3 FB Neck ROM: full    Dental  (+) Caps, Dental Advidsory Given, Poor Dentition   Pulmonary shortness of breath and with exertion, asthma , neg sleep apnea, COPD,  COPD inhaler, neg recent URI, former smoker,           Cardiovascular Exercise Tolerance: Good negative cardio ROS       Neuro/Psych neg Seizures PSYCHIATRIC DISORDERS (Depression)  Neuromuscular disease    GI/Hepatic Neg liver ROS, GERD  ,  Endo/Other  neg diabetesHypothyroidism   Renal/GU negative Renal ROS  negative genitourinary   Musculoskeletal   Abdominal   Peds  Hematology negative hematology ROS (+)   Anesthesia Other Findings Past Medical History: No date: Asthma Right: Breast cancer ([place]) No date: COPD (chronic obstructive pulmonary disease) ([facility]) No date: Personal history of radiation therapy   Reproductive/Obstetrics negative OB ROS                             Anesthesia Physical Anesthesia Plan  ASA: II  Anesthesia Plan: General   Post-op Pain Management:    Induction: Intravenous  PONV Risk Score and Plan: 3 and Propofol  Airway Management Planned: Natural Airway and Nasal Cannula  Additional Equipment:   Intra-op Plan:   Post-operative Plan:   Informed Consent: I have reviewed the patients History and Physical, chart, labs and discussed the procedure including the risks, benefits and alternatives for the proposed anesthesia with the patient or authorized representative who has indicated his/her understanding and acceptance.   Dental  Advisory Given  Plan Discussed with: Anesthesiologist, CRNA and Surgeon  Anesthesia Plan Comments:         Anesthesia Quick Evaluation

## 2016-08-14 NOTE — Anesthesia Post-op Follow-up Note (Cosign Needed)
Anesthesia QCDR form completed.        

## 2016-08-14 NOTE — Op Note (Signed)
[facility] Gastroenterology Patient Name: Julie Mckinney Procedure Date: 08/14/2016 10:53 AM MRN: [id number] Account #: [id number] Date of Birth: 1934-09-25 Admit Type: Outpatient Age: 82 Room: [facility] ENDO ROOM 3 Gender: Female Note Status: Finalized Procedure:            Colonoscopy Indications:          Weight loss Providers:            Manya Silvas, MD Referring MD:         Glendon Axe (Referring MD) Medicines:            Propofol per Anesthesia Complications:        No immediate complications. Procedure:            Pre-Anesthesia Assessment:                       - After reviewing the risks and benefits, the patient                        was deemed in satisfactory condition to undergo the                        procedure.                       After obtaining informed consent, the colonoscope was                        passed under direct vision. Throughout the procedure,                        the patient's blood pressure, pulse, and oxygen                        saturations were monitored continuously. The                        Colonoscope was introduced through the anus and                        advanced to the the cecum, identified by appendiceal                        orifice and ileocecal valve. The colonoscopy was                        somewhat difficult due to significant looping and a                        tortuous colon. Successful completion of the procedure                        was aided by changing endoscopes. The patient tolerated                        the procedure well. The quality of the bowel                        preparation was good. Findings:      Multiple large-mouthed diverticula were found in the sigmoid colon.      Internal hemorrhoids were found  during endoscopy. The hemorrhoids were       small and Grade I (internal hemorrhoids that do not prolapse).      A single small localized angioectasia without bleeding was  found in the       cecum. To stop active bleeding, one hemostatic clip was successfully       placed. There was no bleeding at the end of the maneuver. Impression:           - Diverticulosis in the sigmoid colon.                       - Internal hemorrhoids.                       - A single non-bleeding colonic angioectasia. Clip was                        placed.                       - No specimens collected. Recommendation:       - The findings and recommendations were discussed with                        the patient's family. Manya Silvas, MD 08/14/2016 11:49:26 AM This report has been signed electronically. Number of Addenda: 0 Note Initiated On: 08/14/2016 10:53 AM Scope Withdrawal Time: 0 hours 5 minutes 25 seconds  Total Procedure Duration: 0 hours 21 minutes 52 seconds       [facility]

## 2016-08-15 ENCOUNTER — Encounter: Payer: Self-pay | Admitting: Unknown Physician Specialty

## 2016-08-18 ENCOUNTER — Telehealth: Payer: Self-pay | Admitting: *Deleted

## 2016-08-18 NOTE — Telephone Encounter (Signed)
I rcvd a inbasket from [place] who was covering me while I was on vacation. Pt had called in and said her pharmacy phone # [phone]. She called the number and pt had refills and it would be mailed to her on7/17.  I want this in chart so I will have phone number in future.

## 2016-08-18 NOTE — Telephone Encounter (Signed)
-----   Message from [name], RN sent at 07/25/2016 10:30 AM EDT ----- The patient call in phone number for prescriptions is [phone] I called them and they had refills and they were sending it out on the 17th of this month  Julie Mckinney

## 2016-08-21 LAB — SURGICAL PATHOLOGY

## 2016-08-22 ENCOUNTER — Other Ambulatory Visit
Admission: RE | Admit: 2016-08-22 | Discharge: 2016-08-22 | Disposition: A | Discharge status: Home / Self Care | Payer: Medicare Other | Source: Ambulatory Visit | Attending: Nurse Practitioner | Admitting: Nurse Practitioner

## 2016-08-22 DIAGNOSIS — R197 Diarrhea, unspecified: Secondary | ICD-10-CM | POA: Diagnosis present

## 2016-08-22 LAB — GASTROINTESTINAL PANEL BY PCR, STOOL (REPLACES STOOL CULTURE)

## 2016-08-22 LAB — C DIFFICILE QUICK SCREEN W PCR REFLEX
C Diff antigen: POSITIVE — AB
C Diff toxin: NEGATIVE

## 2016-08-22 LAB — CLOSTRIDIUM DIFFICILE BY PCR: Toxigenic C. Difficile by PCR: POSITIVE — AB

## 2016-08-26 ENCOUNTER — Telehealth: Payer: Self-pay | Admitting: *Deleted

## 2016-08-26 NOTE — Telephone Encounter (Signed)
I called the [facility] at [phone] and was told by Herbie Baltimore that she has more refills but each month the pt will need to call pharmacy and let them know she needs her next refill of tamoxifen shipped to her.  I have a number that pt's can call to get the next shipment to her.  The number is [phone]. Please allow 2-5 days for shipment to get to pt.  She says she has been using them for a while and this calling for every medicine refill must be new because she has not had to do it until now.  I told her that while I had Herbie Baltimore on the phone , I told him to go ahead and get her next refill of tamoxifen sent out to pt. She was appreciable for the refill in place and the call back

## 2016-08-26 NOTE — Telephone Encounter (Signed)
Patient called to state that she had still not received her new prescription for letrozole. She has one month left. I reviewed previous notes stating this same info. The drug was not delivered in July. Drug company phone number [phone]

## 2016-09-09 ENCOUNTER — Other Ambulatory Visit: Payer: Self-pay | Admitting: Oncology

## 2016-09-09 ENCOUNTER — Ambulatory Visit
Admission: RE | Admit: 2016-09-09 | Discharge: 2016-09-09 | Disposition: A | Discharge status: Home / Self Care | Payer: Self-pay | Source: Ambulatory Visit | Attending: Oncology | Admitting: Oncology

## 2016-09-09 DIAGNOSIS — R1032 Left lower quadrant pain: Secondary | ICD-10-CM

## 2016-09-12 DIAGNOSIS — A09 Infectious gastroenteritis and colitis, unspecified: Secondary | ICD-10-CM | POA: Insufficient documentation

## 2016-09-13 ENCOUNTER — Other Ambulatory Visit
Admission: RE | Admit: 2016-09-13 | Discharge: 2016-09-13 | Disposition: A | Discharge status: Home / Self Care | Payer: Medicare Other | Source: Ambulatory Visit | Attending: Nurse Practitioner | Admitting: Nurse Practitioner

## 2016-09-13 DIAGNOSIS — A09 Infectious gastroenteritis and colitis, unspecified: Secondary | ICD-10-CM | POA: Diagnosis present

## 2016-09-13 LAB — C DIFFICILE QUICK SCREEN W PCR REFLEX
C Diff antigen: NEGATIVE
C Diff interpretation: NOT DETECTED
C Diff toxin: NEGATIVE

## 2016-10-17 ENCOUNTER — Inpatient Hospital Stay: Payer: Medicare Other | Attending: Oncology | Admitting: Oncology

## 2016-10-17 ENCOUNTER — Encounter: Payer: Self-pay | Admitting: Oncology

## 2016-10-17 ENCOUNTER — Telehealth: Payer: Self-pay | Admitting: Oncology

## 2016-10-17 VITALS — BP 133/81 | HR 65 | Temp 98.5°F | Resp 16 | Wt 120.6 lb

## 2016-10-17 DIAGNOSIS — Z79810 Long term (current) use of selective estrogen receptor modulators (SERMs): Secondary | ICD-10-CM

## 2016-10-17 DIAGNOSIS — J449 Chronic obstructive pulmonary disease, unspecified: Secondary | ICD-10-CM

## 2016-10-17 DIAGNOSIS — J45909 Unspecified asthma, uncomplicated: Secondary | ICD-10-CM | POA: Diagnosis not present

## 2016-10-17 DIAGNOSIS — C50911 Malignant neoplasm of unspecified site of right female breast: Secondary | ICD-10-CM | POA: Diagnosis present

## 2016-10-17 DIAGNOSIS — Z79899 Other long term (current) drug therapy: Secondary | ICD-10-CM | POA: Diagnosis not present

## 2016-10-17 DIAGNOSIS — Z87891 Personal history of nicotine dependence: Secondary | ICD-10-CM

## 2016-10-17 DIAGNOSIS — Z8781 Personal history of (healed) traumatic fracture: Secondary | ICD-10-CM

## 2016-10-17 DIAGNOSIS — F419 Anxiety disorder, unspecified: Secondary | ICD-10-CM | POA: Diagnosis not present

## 2016-10-17 DIAGNOSIS — Z8719 Personal history of other diseases of the digestive system: Secondary | ICD-10-CM | POA: Diagnosis not present

## 2016-10-17 DIAGNOSIS — M8580 Other specified disorders of bone density and structure, unspecified site: Secondary | ICD-10-CM | POA: Diagnosis not present

## 2016-10-17 DIAGNOSIS — R634 Abnormal weight loss: Secondary | ICD-10-CM | POA: Diagnosis not present

## 2016-10-17 DIAGNOSIS — Z923 Personal history of irradiation: Secondary | ICD-10-CM

## 2016-10-17 DIAGNOSIS — Z170 Estrogen receptor positive status [ER+]: Secondary | ICD-10-CM | POA: Diagnosis not present

## 2016-10-17 NOTE — Progress Notes (Signed)
Routine follow up for breast cancer. Patient states that she has chronic leg from a pinched nerve and is being treated by Dr. Candiss Norse at the pain clinic. She also states that she has been taking Aleve at bedtime for the past 4 months. She does have some visible bruising on her left arm, and states that she has always bruised easily. Her appetite is good and she has gained five pounds since her last visit.

## 2016-10-17 NOTE — Telephone Encounter (Signed)
Mammo and [place] orders was placed after patient left. Mammo and [place] scheduled.  Updated appointments mailed to patient.

## 2016-10-17 NOTE — Progress Notes (Signed)
Hematology/Oncology Consult note [facility]  Telephone:([phone] [phone]  Patient Care Team: Glendon Axe, MD as PCP - General (Internal Medicine)   Name of the patient: Julie Mckinney  [id number]  Oct 02, 1934   Date of visit: 10/17/16  Diagnosis- 1. H/o breast cancer now on tamoxifen  Chief complaint/ Reason for visit- routine f/u of breast cancer and weight loss  Heme/Onc history: 1. Patient is a 82 year old female with a history of invasive lobular carcinoma of the right breast diagnosed in 2015 lumpectomy and adjuvant radiation therapy. She was found to have a 6 x 5 x 10 mm mass in her right breast on core biopsy that was ER/PR positive and HER-2/neu negative. She underwent lumpectomy and sentinel lymph node biopsy in November 2015 which showed T2 N0 invasive lobular carcinoma measuring 2.6 cm, low-grade. 0 out of 4 sentinel lymph nodes were involved. Focally positive margin with involvement of the pectoralis muscle alone. Reexcision was not pursued and she received radiation boost to this area. She was subsequently started on hormone therapy with letrozole but had problems tolerating it due to myalgias and arthralgias and is currently on tamoxifen. She has had osteopenia in the past and has had nontraumatic bilateral were fractures despite taking Fosamax. Last mammogram from 2017 was reportedly normal and she is due for one this year. She had bone density scan yesterday and I do not have those results with me today  2. She recently moved here from [place] and notes that sometime in April 2018 she was in the hospital with severe diarrhea. She didn't know was weight from 135 pounds to 115 pounds. She is now back up to 118 pounds and is slowly trying to gain weight. She has been seen at [facility] GI for the same and is scheduled to undergo EGD and colonoscopy next month. She also had a recent CT abdomen (I do not have those results with me  to review) but notes from Dr. Durenda Age anxiety and that she had diverticulosis noted on CT abdomen but no evidence of malignancy.  3. Patient also has a history of COPD due to prior smoking but is not currently on home oxygen. She is independent of her ADLs and IADLs  Interval history- reports doing well. Her appetite is improved and she is gaining weight. She has leg paina nd issues with pinched nerve that are chronic  ECOG PS- 0 Pain scale- 0  Review of systems- Review of Systems  Constitutional: Negative for chills, fever, malaise/fatigue and weight loss.  HENT: Negative for congestion, ear discharge and nosebleeds.   Eyes: Negative for blurred vision.  Respiratory: Negative for cough, hemoptysis, sputum production, shortness of breath and wheezing.   Cardiovascular: Negative for chest pain, palpitations, orthopnea and claudication.  Gastrointestinal: Negative for abdominal pain, blood in stool, constipation, diarrhea, heartburn, melena, nausea and vomiting.  Genitourinary: Negative for dysuria, flank pain, frequency, hematuria and urgency.  Musculoskeletal: Negative for back pain, joint pain and myalgias.  Skin: Negative for rash.  Neurological: Negative for dizziness, tingling, focal weakness, seizures, weakness and headaches.  Endo/Heme/Allergies: Does not bruise/bleed easily.  Psychiatric/Behavioral: Negative for depression and suicidal ideas. The patient does not have insomnia.        Allergies  Allergen Reactions  . Tape Rash     Past Medical History:  Diagnosis Date  . Asthma   . Breast cancer ([place]) Right  . COPD (chronic obstructive pulmonary disease) ([place])   . Personal history of radiation  therapy      Past Surgical History:  Procedure Laterality Date  . ABDOMINAL HYSTERECTOMY    . APPENDECTOMY    . BREAST BIOPSY Right 2015   +  . BREAST BIOPSY Right 1975   neg  . BREAST LUMPECTOMY  1975  . CHOLECYSTECTOMY    . COLONOSCOPY     Scheduled for July 2018   . COLONOSCOPY WITH PROPOFOL N/A 08/14/2016   Procedure: COLONOSCOPY WITH PROPOFOL;  Surgeon: Manya Silvas, MD;  [facility] END[facility]COPY;  Service: Endoscopy;  Laterality: N/A;  . ESOPHAGOGASTRODUODENOSCOPY (EGD) WITH PROPOFOL N/A 08/14/2016   Procedure: ESOPHAGOGASTRODUODENOSCOPY (EGD) WITH PROPOFOL;  Surgeon: Manya Silvas, MD;  Location[facility] END[facility]COPY;  Service: Endoscopy;  Laterality: N/A;    Social History   Social History  . Marital status: Married    Spouse name: N/A  . Number of children: N/A  . Years of education: N/A   Occupational History  . Not on file.   Social History Main Topics  . Smoking status: Former Smoker    Packs/day: 0.50    Years: 30.00    Types: Cigarettes    Quit date: 06/22/1995  . Smokeless tobacco: Never Used  . Alcohol use Yes     Comment: Occasional  . Drug use: No  . Sexual activity: Not on file   Other Topics Concern  . Not on file   Social History Narrative  . No narrative on file    Family History  Problem Relation Age of Onset  . Cancer Father   . Breast cancer Neg Hx      Current Outpatient Prescriptions:  .  albuterol (PROVENTIL HFA;VENTOLIN HFA) 108 (90 Base) MCG/ACT inhaler, Inhale 2 puffs into the lungs every 6 (six) hours as needed., Disp: , Rfl:  .  calcium carbonate (CALCIUM 600) 600 MG TABS tablet, Take 600 mg by mouth 2 (two) times daily., Disp: , Rfl:  .  Cholecalciferol (VITAMIN D3) 1000 units CAPS, Take 1,000 mg by mouth daily., Disp: , Rfl:  .  diclofenac (VOLTAREN) 75 MG EC tablet, Take 75 mg by mouth 2 (two) times daily., Disp: , Rfl:  .  fluticasone (FLONASE) 50 MCG/ACT nasal spray, Place 1 spray into the nose as needed., Disp: , Rfl:  .  Fluticasone Furoate-Vilanterol (BREO ELLIPTA IN), Inhale 1 puff into the lungs daily., Disp: , Rfl:  .  GLUCOSAMINE SULFATE PO, Take 1 capsule by mouth 2 (two) times daily., Disp: , Rfl:  .  levothyroxine (SYNTHROID, LEVOTHROID) 50 MCG tablet, Take 50 mcg by mouth daily.,  Disp: , Rfl:  .  mirtazapine (REMERON) 45 MG tablet, Take 45 mg by mouth at bedtime., Disp: , Rfl:  .  Multiple Vitamin (MULTI-VITAMINS) TABS, Take 1 tablet by mouth daily., Disp: , Rfl:  .  naproxen sodium (ANAPROX) 220 MG tablet, Take 220 mg by mouth at bedtime., Disp: , Rfl:  .  omeprazole (PRILOSEC) 20 MG capsule, Take 20 mg by mouth daily., Disp: , Rfl:  .  simvastatin (ZOCOR) 80 MG tablet, Take 80 mg by mouth daily., Disp: , Rfl:  .  tamoxifen (NOLVADEX) 20 MG tablet, Take 20 mg by mouth daily., Disp: , Rfl:  .  tiotropium (SPIRIVA) 18 MCG inhalation capsule, Place 1 capsule into inhaler and inhale as needed., Disp: , Rfl:  .  traMADol (ULTRAM) 50 MG tablet, Take 5 mg by mouth as needed., Disp: , Rfl:  .  Venlafaxine HCl 225 MG TB24, Take 2.25 mg by mouth 2 (  two) times daily., Disp: , Rfl:   Physical exam:  Vitals:   10/17/16 1047  BP: 133/81  Pulse: 65  Resp: 16  Temp: 98.5 F (36.9 C)  TempSrc: Tympanic  Weight: 120 lb 9.6 oz (54.7 kg)   Physical Exam  Constitutional: She is oriented to person, place, and time and well-developed, well-nourished, and in no distress.  HENT:  Head: Normocephalic and atraumatic.  Eyes: Pupils are equal, round, and reactive to light. EOM are normal.  Neck: Normal range of motion.  Cardiovascular: Normal rate, regular rhythm and normal heart sounds.   Pulmonary/Chest: Effort normal and breath sounds normal.  Abdominal: Soft. Bowel sounds are normal.  Neurological: She is alert and oriented to person, place, and time.  Skin: Skin is warm and dry.   Breast exam was performed in seated and lying down position. Patient is status post left lumpectomy with a well-healed surgical scar. No evidence of any palpable masses. No evidence of axillary adenopathy. No evidence of any palpable masses or lumps in the right breast. No evidence of right axillary adenopathy   CMP Latest Ref Rng & Units 07/16/2016  Glucose 65 - 99 mg/dL 99  BUN 6 - 20 mg/dL 21(H)    Creatinine 0.44 - 1.00 mg/dL 0.90  Sodium 135 - 145 mmol/L 137  Potassium 3.5 - 5.1 mmol/L 4.2  Chloride 101 - 111 mmol/L 102  CO2 22 - 32 mmol/L 26  Calcium 8.9 - 10.3 mg/dL 9.1  Total Protein 6.5 - 8.1 g/dL 7.4  Total Bilirubin 0.3 - 1.2 mg/dL 0.5  Alkaline Phos 38 - 126 U/L 42  AST 15 - 41 U/L 28  ALT 14 - 54 U/L 19   CBC Latest Ref Rng & Units 07/16/2016  WBC 3.6 - 11.0 K/uL 10.2  Hemoglobin 12.0 - 16.0 g/dL 12.9  Hematocrit 35.0 - 47.0 % 37.7  Platelets 150 - 440 K/uL 291     Assessment and plan- Patient is a 82 y.o. female for following issues:  1. H/o stage I left breast cancer on tamoxifen- patient is tolerating her tamoxifen well and has no significant side effects. Recent mammogram from July 2018 showed no malignancy. Clinically he is doing well and there is no evidence of recurrence on today's exam. We will schedule repeat mammogram from July 2019. She will also continue tamoxifen for 5 years ending in 2021.  2. Abnormal weight loss- it has now resolved and patient is gaining weight. Recent EGD and colonoscopy did not reveal any evidence of malignancy. Continue to monitor  Return to clinic in 6 months   Visit Diagnosis 1. Malignant neoplasm of right breast in female, estrogen receptor positive, unspecified site of breast [facility])      Dr. Randa Evens, MD, MPH [facility] at [facility] Pager- [phone] 10/17/2016 12:06 PM

## 2016-10-28 DIAGNOSIS — E538 Deficiency of other specified B group vitamins: Secondary | ICD-10-CM | POA: Insufficient documentation

## 2016-11-19 ENCOUNTER — Emergency Department: Payer: Medicare Other

## 2016-11-19 ENCOUNTER — Emergency Department
Admission: EM | Admit: 2016-11-19 | Discharge: 2016-11-19 | Disposition: A | Discharge status: Home / Self Care | Payer: Medicare Other | Attending: Emergency Medicine | Admitting: Emergency Medicine

## 2016-11-19 ENCOUNTER — Encounter: Payer: Self-pay | Admitting: *Deleted

## 2016-11-19 DIAGNOSIS — S62616A Displaced fracture of proximal phalanx of right little finger, initial encounter for closed fracture: Secondary | ICD-10-CM | POA: Insufficient documentation

## 2016-11-19 DIAGNOSIS — W010XXA Fall on same level from slipping, tripping and stumbling without subsequent striking against object, initial encounter: Secondary | ICD-10-CM | POA: Diagnosis not present

## 2016-11-19 DIAGNOSIS — S6991XA Unspecified injury of right wrist, hand and finger(s), initial encounter: Secondary | ICD-10-CM | POA: Diagnosis present

## 2016-11-19 DIAGNOSIS — Z79899 Other long term (current) drug therapy: Secondary | ICD-10-CM | POA: Diagnosis not present

## 2016-11-19 DIAGNOSIS — Y999 Unspecified external cause status: Secondary | ICD-10-CM | POA: Insufficient documentation

## 2016-11-19 DIAGNOSIS — Z791 Long term (current) use of non-steroidal anti-inflammatories (NSAID): Secondary | ICD-10-CM | POA: Insufficient documentation

## 2016-11-19 DIAGNOSIS — W19XXXA Unspecified fall, initial encounter: Secondary | ICD-10-CM

## 2016-11-19 DIAGNOSIS — M79641 Pain in right hand: Secondary | ICD-10-CM

## 2016-11-19 DIAGNOSIS — J449 Chronic obstructive pulmonary disease, unspecified: Secondary | ICD-10-CM | POA: Insufficient documentation

## 2016-11-19 DIAGNOSIS — Z87891 Personal history of nicotine dependence: Secondary | ICD-10-CM | POA: Diagnosis not present

## 2016-11-19 DIAGNOSIS — Y9301 Activity, walking, marching and hiking: Secondary | ICD-10-CM | POA: Diagnosis not present

## 2016-11-19 DIAGNOSIS — Y92488 Other paved roadways as the place of occurrence of the external cause: Secondary | ICD-10-CM | POA: Insufficient documentation

## 2016-11-19 DIAGNOSIS — J45909 Unspecified asthma, uncomplicated: Secondary | ICD-10-CM | POA: Insufficient documentation

## 2016-11-19 DIAGNOSIS — E039 Hypothyroidism, unspecified: Secondary | ICD-10-CM | POA: Diagnosis not present

## 2016-11-19 DIAGNOSIS — S62617A Displaced fracture of proximal phalanx of left little finger, initial encounter for closed fracture: Secondary | ICD-10-CM

## 2016-11-19 MED ORDER — TRAMADOL HCL 50 MG PO TABS: 50.0000 mg | ORAL_TABLET | Freq: Four times a day (QID) | ORAL | 0 refills | Status: AC | PRN

## 2016-11-19 MED ORDER — TRAMADOL HCL 50 MG PO TABS
50.0000 mg | ORAL_TABLET | Freq: Once | ORAL | Status: AC
  Administered 2016-11-19: 50 mg via ORAL
  Filled 2016-11-19: qty 1

## 2016-11-19 NOTE — ED Triage Notes (Signed)
States this AM she tripped and fell down, states right pinky pain and swelling with some bruising, states she also hit the right side of her head, denies any LOC, denies any use of blood thinners, awake and alert, ambulatory

## 2016-11-19 NOTE — ED Provider Notes (Signed)
[facility] Emergency Department Provider Note   ____________________________________________   I have reviewed the triage vital signs and the nursing notes.   HISTORY  Chief Complaint Fall and Hand Pain    HPI Julie Mckinney is a 82 y.o. female presents to the  emergency department after falling from a standing position earlier this afternoon.  Patient was walking her dog on an asphalt road when she was looking off to the right, missed stepped then falling. When she landed she struck the right lateral hand and right side of her face. Patient bent the arm of her glasses. She denied any changes in vision after the fall.  Patient denies loss of consciousness, recalls the events and was at her neurological baseline following the accident.  Patient fell from a standing position and did not strike any objects other than the asphalt on the ground.  Patient noted small abrasion along the right side of the eye and bruising, pain and deformity at the right Lynnae Ludemann finger. Patient is not on blood thinners.  Patient denies fever, chills, headache, vision changes, chest pain, chest tightness, shortness of breath, abdominal pain, nausea and vomiting.  Past Medical History:  Diagnosis Date  . Asthma   . Breast cancer ([place]) Right  . COPD (chronic obstructive pulmonary disease) ([place])   . Personal history of radiation therapy     Patient Active Problem List   Diagnosis Date Noted  . Acquired hypothyroidism 07/02/2016  . Chronic obstructive pulmonary disease ([place]) 07/02/2016  . Depression, major, single episode, complete remission ([place]) 07/02/2016  . Gastroesophageal reflux disease without esophagitis 07/02/2016  . Lumbar radiculopathy 07/02/2016  . Osteopenia of multiple sites 07/02/2016  . History of diarrhea 06/10/2016  . Weight loss, abnormal 06/10/2016    Past Surgical History:  Procedure Laterality Date  . ABDOMINAL HYSTERECTOMY    . APPENDECTOMY    . BREAST  BIOPSY Right 2015   +  . BREAST BIOPSY Right 1975   neg  . BREAST LUMPECTOMY  1975  . CHOLECYSTECTOMY    . COLONOSCOPY     Scheduled for July 2018  . COLONOSCOPY WITH PROPOFOL N/A 08/14/2016   Procedure: COLONOSCOPY WITH PROPOFOL;  Surgeon: Manya Silvas, MD;  Location: [facility] ENDOSCOPY;  Service: Endoscopy;  Laterality: N/A;  . ESOPHAGOGASTRODUODENOSCOPY (EGD) WITH PROPOFOL N/A 08/14/2016   Procedure: ESOPHAGOGASTRODUODENOSCOPY (EGD) WITH PROPOFOL;  Surgeon: Manya Silvas, MD;  Location: [facility] ENDOSCOPY;  Service: Endoscopy;  Laterality: N/A;    Prior to Admission medications   Medication Sig Start Date End Date Taking? Authorizing Provider  albuterol (PROVENTIL HFA;VENTOLIN HFA) 108 (90 Base) MCG/ACT inhaler Inhale 2 puffs into the lungs every 6 (six) hours as needed.    [provider]  calcium carbonate (CALCIUM 600) 600 MG TABS tablet Take 600 mg by mouth 2 (two) times daily.    [provider]  Cholecalciferol (VITAMIN D3) 1000 units CAPS Take 1,000 mg by mouth daily.    [provider]  diclofenac (VOLTAREN) 75 MG EC tablet Take 75 mg by mouth 2 (two) times daily.    [provider]  fluticasone (FLONASE) 50 MCG/ACT nasal spray Place 1 spray into the nose as needed.    [provider]  Fluticasone Furoate-Vilanterol (BREO ELLIPTA IN) Inhale 1 puff into the lungs daily.    [provider]  GLUCOSAMINE SULFATE PO Take 1 capsule by mouth 2 (two) times daily.    [provider]  levothyroxine (SYNTHROID, LEVOTHROID) 50 MCG tablet  Take 50 mcg by mouth daily.    [provider]  mirtazapine (REMERON) 45 MG tablet Take 45 mg by mouth at bedtime.    [provider]  Multiple Vitamin (MULTI-VITAMINS) TABS Take 1 tablet by mouth daily.    [provider]  naproxen sodium (ANAPROX) 220 MG tablet Take 220 mg by mouth at bedtime.    [provider]  omeprazole (PRILOSEC) 20 MG capsule Take 20 mg  by mouth daily.    [provider]  simvastatin (ZOCOR) 80 MG tablet Take 80 mg by mouth daily.    [provider]  tamoxifen (NOLVADEX) 20 MG tablet Take 20 mg by mouth daily.    [provider]  tiotropium (SPIRIVA) 18 MCG inhalation capsule Place 1 capsule into inhaler and inhale as needed.    [provider]  traMADol (ULTRAM) 50 MG tablet Take 1 tablet (50 mg total) by mouth every 6 (six) hours as needed. 11/19/16 11/19/17  Shaasia Odle M, PA-C  Venlafaxine HCl 225 MG TB24 Take 2.25 mg by mouth 2 (two) times daily.    [provider]    Allergies Tape  Family History  Problem Relation Age of Onset  . Cancer Father   . Breast cancer Neg Hx     Social History Social History  Substance Use Topics  . Smoking status: Former Smoker    Packs/day: 0.50    Years: 30.00    Types: Cigarettes    Quit date: 06/22/1995  . Smokeless tobacco: Never Used  . Alcohol use Yes     Comment: Occasional    Review of Systems Constitutional: Negative for fever/chills Eyes: No visual changes. Cardiovascular: Denies chest pain. Respiratory: Denies shortness of breath. Musculoskeletal: Right Lennette Fader finger pain, swelling and bruising. Abrasion along right lateral eye.  Skin: Negative for rash. Neurological: Negative for headaches.  Negative focal weakness or numbness. Negative for loss of consciousness. Able to ambulate. ____________________________________________   PHYSICAL EXAM:  VITAL SIGNS: Patient Vitals for the past 24 hrs:  BP Temp Temp src Pulse Resp SpO2 Height Weight  11/19/16 1434 (!) 125/54 - - 66 16 98 % - -  11/19/16 1242 - - - - - - 5' (1.524 m) 54.4 kg (120 lb)  11/19/16 1237 (!) 129/48 98.2 F (36.8 C) Oral 66 16 99 % - -  11/19/16 1221 - - - - - - 5\' 2"  (1.575 m) 54.4 kg (120 lb)    Constitutional: Alert and oriented. Well appearing and in no acute distress.  Eyes: Conjunctivae are normal. PERRL. EOMI No change in visual  acuity.  Head: Normocephalic and atraumatic. Small abrasion along right lateral eye.  Cardiovascular: Normal rate, regular rhythm. Respiratory: Normal respiratory effort without tachypnea or retractions.  Musculoskeletal: Right hand and wrist ROM, sensation and strength intact. Right Yudith Norlander finger ROM limited secondary to injury. Sensation intact. Deformity, swelling and ecchymosis noted.  Neurologic: Normal speech and language. No gross focal neurologic deficits are appreciated.  Skin:  Skin is warm, dry and intact. No rash noted. Psychiatric: Mood and affect are normal. Speech and behavior are normal. Patient exhibits appropriate insight and judgement.  ____________________________________________   LABS (all labs ordered are listed, but only abnormal results are displayed)  Labs Reviewed - No data to display ____________________________________________  EKG none ____________________________________________  RADIOLOGY DG right hand complete FINDINGS: A minimally angulated, minimally displaced fracture involves the proximal portion the proximal phalanx of the fifth digit. Moderate soft tissue swelling in this area.  Extensive degenerative changes throughout. An oblique component extends into the more distal portion of the proximal phalanx. No intra-articular extension.  IMPRESSION: Proximal phalangeal fracture, fifth digit. ____________________________________________   PROCEDURES  Procedure(s) performed:  SPLINT APPLICATION Date/Time: 4:78 PM Authorized by: Jerolyn Shin Consent: Verbal consent obtained. Risks and benefits: risks, benefits and alternatives were discussed Consent given by: patient Splint applied by: Samual Beals, PA-C Location details: right hand, Okechukwu Regnier finger Splint type: Finger splint Supplies used: Ortho glass, cast padding and ACE wrap Post-procedure: The splinted body part was neurovascularly unchanged following the procedure. Patient tolerance:  Patient tolerated the procedure well with no immediate complications.  Initial fracture care was provided. Follow up will be greater than 24 hours.    Critical Care performed: no ____________________________________________   INITIAL IMPRESSION / ASSESSMENT AND PLAN / ED COURSE  Pertinent labs & imaging results that were available during my care of the patient were reviewed by me and considered in my medical decision making (see chart for details).   Patient presented with right Lakena Sparlin finger pain, swelling, deformity and ecchymosis after falling earlier today.  Patient physical exam and imaging findings are consistent with displaced proximal phalangeal fracture of the right Nigel Wessman finger. The fracture splinted and the rigt hand neurovasculature intact following splint application. Patient was advised to follow up with Orthopedics for continued care and was also advised to return to the emergency department for symptoms that change or worsen. Patient informed of clinical course, understand medical decision-making process, and agree with plan. ____________________________________________   FINAL CLINICAL IMPRESSION(S) / ED DIAGNOSES  Final diagnoses:  Fall, initial encounter  Right hand pain  Displaced fracture of proximal phalanx of left Eula Jaster finger, initial encounter for closed fracture       NEW MEDICATIONS STARTED DURING THIS VISIT:  Discharge Medication List as of 11/19/2016  2:29 PM       Note:  This document was prepared using Dragon voice recognition software and may include unintentional dictation errors.    Chenoa Luddy, [place], PA-C 11/19/16 [place], [place], MD 11/21/16 1215

## 2016-11-19 NOTE — ED Notes (Signed)
EDP at bedside  

## 2016-11-19 NOTE — ED Notes (Signed)
Pt ambulatory at discharge. Pt and friend verbalized understanding of prescription, pain management, follow-up care and discharge instructions. Pt A&O x4. VSS. Skin warm and dry.

## 2016-11-19 NOTE — Discharge Instructions (Signed)
Take medication as prescribed.  If pain is not severe enough for tramadol you may also consider Tylenol for pain management as directed on the medication bottle.  Return to emergency department if symptoms worsen otherwise call to schedule a follow-up appointment with orthopedics.  Contact information is included in her discharge instructions.  Dr. Peggye Ley, MD is a hand surgeon specialist with [facility].

## 2016-11-19 NOTE — ED Notes (Signed)
Portable XR at bedside

## 2017-01-03 ENCOUNTER — Other Ambulatory Visit: Payer: Self-pay | Admitting: Physical Medicine and Rehabilitation

## 2017-01-03 DIAGNOSIS — M5416 Radiculopathy, lumbar region: Secondary | ICD-10-CM

## 2017-01-09 ENCOUNTER — Ambulatory Visit
Admission: RE | Admit: 2017-01-09 | Discharge: 2017-01-09 | Disposition: A | Discharge status: Home / Self Care | Payer: Medicare Other | Source: Ambulatory Visit | Attending: Physical Medicine and Rehabilitation | Admitting: Physical Medicine and Rehabilitation

## 2017-01-09 DIAGNOSIS — M1288 Other specific arthropathies, not elsewhere classified, other specified site: Secondary | ICD-10-CM | POA: Insufficient documentation

## 2017-01-09 DIAGNOSIS — M5116 Intervertebral disc disorders with radiculopathy, lumbar region: Secondary | ICD-10-CM | POA: Diagnosis not present

## 2017-01-09 DIAGNOSIS — M4317 Spondylolisthesis, lumbosacral region: Secondary | ICD-10-CM | POA: Diagnosis not present

## 2017-01-09 DIAGNOSIS — M5416 Radiculopathy, lumbar region: Secondary | ICD-10-CM | POA: Diagnosis present

## 2017-01-09 DIAGNOSIS — M48061 Spinal stenosis, lumbar region without neurogenic claudication: Secondary | ICD-10-CM | POA: Insufficient documentation

## 2017-03-07 ENCOUNTER — Emergency Department
Admission: EM | Admit: 2017-03-07 | Discharge: 2017-03-08 | Disposition: A | Discharge status: Home / Self Care | Payer: Medicare Other | Attending: Emergency Medicine | Admitting: Emergency Medicine

## 2017-03-07 ENCOUNTER — Other Ambulatory Visit: Payer: Self-pay

## 2017-03-07 DIAGNOSIS — J45909 Unspecified asthma, uncomplicated: Secondary | ICD-10-CM | POA: Insufficient documentation

## 2017-03-07 DIAGNOSIS — F419 Anxiety disorder, unspecified: Secondary | ICD-10-CM | POA: Insufficient documentation

## 2017-03-07 DIAGNOSIS — Z923 Personal history of irradiation: Secondary | ICD-10-CM | POA: Diagnosis not present

## 2017-03-07 DIAGNOSIS — J449 Chronic obstructive pulmonary disease, unspecified: Secondary | ICD-10-CM | POA: Insufficient documentation

## 2017-03-07 DIAGNOSIS — Z853 Personal history of malignant neoplasm of breast: Secondary | ICD-10-CM | POA: Diagnosis not present

## 2017-03-07 DIAGNOSIS — Z79899 Other long term (current) drug therapy: Secondary | ICD-10-CM | POA: Insufficient documentation

## 2017-03-07 DIAGNOSIS — Z87891 Personal history of nicotine dependence: Secondary | ICD-10-CM | POA: Insufficient documentation

## 2017-03-07 DIAGNOSIS — F329 Major depressive disorder, single episode, unspecified: Secondary | ICD-10-CM

## 2017-03-07 DIAGNOSIS — F32A Depression, unspecified: Secondary | ICD-10-CM

## 2017-03-07 LAB — COMPREHENSIVE METABOLIC PANEL
ALK PHOS: 39 U/L (ref 38–126)
ALT: 19 U/L (ref 14–54)
ANION GAP: 6 (ref 5–15)
AST: 28 U/L (ref 15–41)
Albumin: 4 g/dL (ref 3.5–5.0)
BUN: 20 mg/dL (ref 6–20)
CALCIUM: 8.7 mg/dL — AB (ref 8.9–10.3)
CO2: 25 mmol/L (ref 22–32)
Chloride: 111 mmol/L (ref 101–111)
Creatinine, Ser: 0.75 mg/dL (ref 0.44–1.00)
GFR calc non Af Amer: >60 mL/min (ref >60)
Glucose, Bld: 115 mg/dL — ABNORMAL HIGH (ref 65–99)
Potassium: 3.8 mmol/L (ref 3.5–5.1)
SODIUM: 142 mmol/L (ref 135–145)
TOTAL PROTEIN: 6.7 g/dL (ref 6.5–8.1)
Total Bilirubin: 0.5 mg/dL (ref 0.3–1.2)

## 2017-03-07 LAB — URINE DRUG SCREEN, QUALITATIVE (ARMC ONLY)
Amphetamines, Ur Screen: NOT DETECTED
BARBITURATES, UR SCREEN: NOT DETECTED
BENZODIAZEPINE, UR SCRN: NOT DETECTED
Cannabinoid 50 Ng, Ur ~~LOC~~: NOT DETECTED
Cocaine Metabolite,Ur ~~LOC~~: NOT DETECTED
MDMA (Ecstasy)Ur Screen: NOT DETECTED
METHADONE SCREEN, URINE: NOT DETECTED
Opiate, Ur Screen: NOT DETECTED
Phencyclidine (PCP) Ur S: NOT DETECTED
TRICYCLIC, UR SCREEN: NOT DETECTED

## 2017-03-07 LAB — CBC
HCT: 36.1 % (ref 35.0–47.0)
HEMOGLOBIN: 12 g/dL (ref 12.0–16.0)
MCH: 31.6 pg (ref 26.0–34.0)
MCHC: 33.3 g/dL (ref 32.0–36.0)
MCV: 95.1 fL (ref 80.0–100.0)
PLATELETS: 227 10*3/uL (ref 150–440)
RBC: 3.8 MIL/uL (ref 3.80–5.20)
RDW: 13.6 % (ref 11.5–14.5)
WBC: 5.1 10*3/uL (ref 3.6–11.0)

## 2017-03-07 LAB — SALICYLATE LEVEL

## 2017-03-07 LAB — ACETAMINOPHEN LEVEL: Acetaminophen (Tylenol), Serum: <10 ug/mL — ABNORMAL LOW (ref 10–30)

## 2017-03-07 LAB — ETHANOL: Alcohol, Ethyl (B): <10 mg/dL (ref <10)

## 2017-03-07 NOTE — ED Triage Notes (Signed)
Pt arrives to ED via POV from home with c/o "a nervous breakdown"". Pt reports crying, feeling nervous, and states she "has not been able to get a psychiatrist". Pt is tearful, reports "it's been one thing after another and no one will help me". Pt denies thoughts of SI or HI, just states she wants help, but cannot provide more specific details.

## 2017-03-07 NOTE — ED Notes (Signed)
Pt updated on the plan of care. Awaiting EDP.

## 2017-03-07 NOTE — ED Provider Notes (Signed)
[facility] Emergency Department Provider Note   ____________________________________________   First MD Initiated Contact with Patient 03/07/17 2315     (approximate)  I have reviewed the triage vital signs and the nursing notes.   HISTORY  Chief Complaint Anxiety    HPI Julie Mckinney is a 83 y.o. female who presents to the ED from home with a chief complaint of "nervous breakdown".  Patient reports moving to town last April; over the summer she had a roach infestation in her apartment which made her anxious.  She was prescribed something by her PCP's PA which helped her anxiety but 3 weeks ago her PCP discontinued the medicine and declined to prescribe her something else.  She was told to find a psychiatrist.  States she has been calling around and has been unable to establish an appointment with a psychiatrist.  Patient expresses frustration, she is tearful and feels helpless that "no one will help me".  Denies active SI/HI/AH/VH.  Voices no medical complaints.   Past Medical History:  Diagnosis Date  . Asthma   . Breast cancer ([place]) Right  . COPD (chronic obstructive pulmonary disease) ([place])   . Personal history of radiation therapy     Patient Active Problem List   Diagnosis Date Noted  . Acquired hypothyroidism 07/02/2016  . Chronic obstructive pulmonary disease ([place]) 07/02/2016  . Depression, major, single episode, complete remission ([place]) 07/02/2016  . Gastroesophageal reflux disease without esophagitis 07/02/2016  . Lumbar radiculopathy 07/02/2016  . Osteopenia of multiple sites 07/02/2016  . History of diarrhea 06/10/2016  . Weight loss, abnormal 06/10/2016    Past Surgical History:  Procedure Laterality Date  . ABDOMINAL HYSTERECTOMY    . APPENDECTOMY    . BREAST BIOPSY Right 2015   +  . BREAST BIOPSY Right 1975   neg  . BREAST LUMPECTOMY  1975  . CHOLECYSTECTOMY    . COLONOSCOPY     Scheduled for July 2018  . COLONOSCOPY  WITH PROPOFOL N/A 08/14/2016   Procedure: COLONOSCOPY WITH PROPOFOL;  Surgeon: Manya Silvas, MD;  Location: [facility] ENDOSCOPY;  Service: Endoscopy;  Laterality: N/A;  . ESOPHAGOGASTRODUODENOSCOPY (EGD) WITH PROPOFOL N/A 08/14/2016   Procedure: ESOPHAGOGASTRODUODENOSCOPY (EGD) WITH PROPOFOL;  Surgeon: Manya Silvas, MD;  Location[facility] ENDOSCOPY;  Service: Endoscopy;  Laterality: N/A;    Prior to Admission medications   Medication Sig Start Date End Date Taking? Authorizing Provider  albuterol (PROVENTIL HFA;VENTOLIN HFA) 108 (90 Base) MCG/ACT inhaler Inhale 2 puffs into the lungs every 6 (six) hours as needed.    [provider]  calcium carbonate (CALCIUM 600) 600 MG TABS tablet Take 600 mg by mouth 2 (two) times daily.    [provider]  Cholecalciferol (VITAMIN D3) 1000 units CAPS Take 1,000 mg by mouth daily.    [provider]  diclofenac (VOLTAREN) 75 MG EC tablet Take 75 mg by mouth 2 (two) times daily.    [provider]  fluticasone (FLONASE) 50 MCG/ACT nasal spray Place 1 spray into the nose as needed.    [provider]  Fluticasone Furoate-Vilanterol (BREO ELLIPTA IN) Inhale 1 puff into the lungs daily.    [provider]  GLUCOSAMINE SULFATE PO Take 1 capsule by mouth 2 (two) times daily.    [provider]  hydrOXYzine (ATARAX/VISTARIL) 25 MG tablet Take 1 tablet (25 mg total) by mouth every 8 (eight) hours as needed for anxiety. 03/08/17   Paulette Blanch, MD  levothyroxine (SYNTHROID,  LEVOTHROID) 50 MCG tablet Take 50 mcg by mouth daily.    [provider]  mirtazapine (REMERON) 30 MG tablet Take 1 tablet (30 mg total) by mouth at bedtime. 03/08/17   Paulette Blanch, MD  Multiple Vitamin (MULTI-VITAMINS) TABS Take 1 tablet by mouth daily.    [provider]  naproxen sodium (ANAPROX) 220 MG tablet Take 220 mg by mouth at bedtime.    [provider]  omeprazole (PRILOSEC) 20 MG capsule Take 20 mg  by mouth daily.    [provider]  simvastatin (ZOCOR) 80 MG tablet Take 80 mg by mouth daily.    [provider]  tamoxifen (NOLVADEX) 20 MG tablet Take 20 mg by mouth daily.    [provider]  tiotropium (SPIRIVA) 18 MCG inhalation capsule Place 1 capsule into inhaler and inhale as needed.    [provider]  traMADol (ULTRAM) 50 MG tablet Take 1 tablet (50 mg total) by mouth every 6 (six) hours as needed. 11/19/16 11/19/17  Little, Traci M, PA-C  Venlafaxine HCl 225 MG TB24 Take 2.25 mg by mouth 2 (two) times daily.    [provider]    Allergies Tape  Family History  Problem Relation Age of Onset  . Cancer Father   . Breast cancer Neg Hx     Social History Social History   Tobacco Use  . Smoking status: Former Smoker    Packs/day: 0.50    Years: 30.00    Pack years: 15.00    Types: Cigarettes    Last attempt to quit: 06/22/1995    Years since quitting: 21.7  . Smokeless tobacco: Never Used  Substance Use Topics  . Alcohol use: Yes    Comment: Occasional  . Drug use: No    Review of Systems  Constitutional: No fever/chills. Eyes: No visual changes. ENT: No sore throat. Cardiovascular: Denies chest pain. Respiratory: Denies shortness of breath. Gastrointestinal: No abdominal pain.  No nausea, no vomiting.  No diarrhea.  No constipation. Genitourinary: Negative for dysuria. Musculoskeletal: Negative for back pain. Skin: Negative for rash. Neurological: Negative for headaches, focal weakness or numbness. Psychiatric:Positive for anxiety.  ____________________________________________   PHYSICAL EXAM:  VITAL SIGNS: ED Triage Vitals  Enc Vitals Group     BP 03/07/17 1915 (!) 145/53     Pulse Rate 03/07/17 1915 73     Resp 03/07/17 1915 18     Temp 03/07/17 1915 98.1 F (36.7 C)     Temp Source 03/07/17 1915 Oral     SpO2 03/07/17 1915 98 %     Weight 03/07/17 1912 120 lb (54.4 kg)     Height 03/07/17 1912 5'  (1.524 m)     Head Circumference --      Peak Flow --      Pain Score --      Pain Loc --      Pain Edu? --      Excl. in [place]? --     Constitutional: Alert and oriented. Well appearing and in no acute distress. Eyes: Conjunctivae are normal. PERRL. EOMI. Head: Atraumatic. Nose: No congestion/rhinnorhea. Mouth/Throat: Mucous membranes are moist.  Oropharynx non-erythematous. Neck: No stridor.   Cardiovascular: Normal rate, regular rhythm. Grossly normal heart sounds.  Good peripheral circulation. Respiratory: Normal respiratory effort.  No retractions. Lungs CTAB. Gastrointestinal: Soft and nontender. No distention. No abdominal bruits. No CVA tenderness. Musculoskeletal: No lower extremity tenderness nor edema.  No joint effusions. Neurologic:  Normal speech  and language. No gross focal neurologic deficits are appreciated. No gait instability. Skin:  Skin is warm, dry and intact. No rash noted. Psychiatric: Mood and affect are tearful. Speech and behavior are normal.  Expresses frustration at times while telling me her ordeal and punches her leg.  Tearful at times.  ____________________________________________   LABS (all labs ordered are listed, but only abnormal results are displayed)  Labs Reviewed  COMPREHENSIVE METABOLIC PANEL - Abnormal; Notable for the following components:      Result Value   Glucose, Bld 115 (*)    Calcium 8.7 (*)    All other components within normal limits  ACETAMINOPHEN LEVEL - Abnormal; Notable for the following components:   Acetaminophen (Tylenol), Serum <10 (*)    All other components within normal limits  URINALYSIS, COMPLETE (UACMP) WITH MICROSCOPIC - Abnormal; Notable for the following components:   Color, Urine STRAW (*)    APPearance CLEAR (*)    Bacteria, UA RARE (*)    Squamous Epithelial / LPF 0-5 (*)    All other components within normal limits  ETHANOL  SALICYLATE LEVEL  CBC  URINE DRUG SCREEN, QUALITATIVE ([facility] ONLY)    ____________________________________________  EKG  None ____________________________________________  RADIOLOGY  ED MD interpretation: None  Official radiology report(s): No results found.  ____________________________________________   PROCEDURES  Procedure(s) performed: None  Procedures  Critical Care performed: No  ____________________________________________   INITIAL IMPRESSION / ASSESSMENT AND PLAN / ED COURSE  As part of my medical decision making, I reviewed the following data within the [place] History obtained from family, Nursing notes reviewed and incorporated, Labs reviewed, A consult was requested and obtained from this/these consultant(s) Psychiatry and Notes from prior ED visits.   83 year old female with a past history of anxiety who presents with anxiety and frustration over not being able to find a local psychiatrist.  Denies SI/HI.  Laboratory and urinalysis results unremarkable.  Feel patient would benefit from tele-psychiatry consult for medication recommendations, as well as TTS for outpatient resources.  Clinical Course as of Mar 08 657  Sat Mar 08, 2017  0333 Patient was evaluated by [facility] psychiatrist Dr. Luz Lex who recommends Remeron 30 mg nightly with Atarax 25 mg PRN anxiety.  Patient may be safely discharged home with outpatient psychiatric follow-up.  Strict return precautions given.  Patient and spouse verbalize understanding and agree with plan of care.  [JS]    Clinical Course User Index [JS] Paulette Blanch, MD     ____________________________________________   FINAL CLINICAL IMPRESSION(S) / ED DIAGNOSES  Final diagnoses:  Anxiety  Depression, unspecified depression type     ED Discharge Orders        Ordered    mirtazapine (REMERON) 30 MG tablet  Daily at bedtime     03/08/17 0337    hydrOXYzine (ATARAX/VISTARIL) 25 MG tablet  Every 8 hours PRN     03/08/17 8366       Note:  This document was  prepared using Dragon voice recognition software and may include unintentional dictation errors.    Paulette Blanch, MD 03/08/17 [phone]

## 2017-03-08 DIAGNOSIS — F419 Anxiety disorder, unspecified: Secondary | ICD-10-CM | POA: Diagnosis not present

## 2017-03-08 LAB — URINALYSIS, COMPLETE (UACMP) WITH MICROSCOPIC
BILIRUBIN URINE: NEGATIVE
GLUCOSE, UA: NEGATIVE mg/dL
Hgb urine dipstick: NEGATIVE
KETONES UR: NEGATIVE mg/dL
LEUKOCYTES UA: NEGATIVE
Nitrite: NEGATIVE
PROTEIN: NEGATIVE mg/dL
Specific Gravity, Urine: 1.009 (ref 1.005–1.030)
pH: 5 (ref 5.0–8.0)

## 2017-03-08 MED ORDER — MIRTAZAPINE 30 MG PO TABS: 30.0000 mg | ORAL_TABLET | Freq: Every day | ORAL | 0 refills | Status: DC

## 2017-03-08 MED ORDER — MIRTAZAPINE 15 MG PO TABS
30.0000 mg | ORAL_TABLET | Freq: Once | ORAL | Status: AC
  Administered 2017-03-08: 30 mg via ORAL
  Filled 2017-03-08: qty 2

## 2017-03-08 MED ORDER — HYDROXYZINE HCL 25 MG PO TABS: 25.0000 mg | ORAL_TABLET | Freq: Three times a day (TID) | ORAL | 0 refills | Status: DC | PRN

## 2017-03-08 NOTE — BH Assessment (Signed)
Assessment Note  Julie Mckinney is an 83 y.o. female whom was brought to the [facility] ED by her husband due to ongoing anxiety. Pt states she has been in contact with her PCP for three weeks in an attempt to continue to get a prescription for Abilify, which she was prescribed previously, but her PCP is now refusing to refill the prescription, stating pt needs to see a psychiatrist to continue to receive the medication. Pt states she has attempted to get referrals for psychiatrists, though none of the referrals worked out and re-iterated that she has been attempting to do this for three weeks. Clinician reviewed the notes in pt's chart and noted that pt stated via telephone on 02/27/17 that she did not feel she needed to see a psychiatrist. She called back on 03/05/17 requesting a referral to a psychiatrist and again on 03/07/17 stating she needed to be seen by a psychiatrist ASAP, at which time she was advised to go to the ED.  Pt reports she has been tearful and anxious since going off of Abilify. She states she was seeing a therapist and a psychiatrist when she initially moved to [place] 5 years ago due to some furniture being damaged/ruined; she states she received those services for 4 years. Pt stated when she moved to this area last year things were initially fine and she did not need medication until their living quarters got cockroaches. Pt stated she then became anxious and she again needed the prescription for Abilify, which she was initially able to get by a provider other than her PCP. Pt reports she is having back fusion surgery in April and her being prescribed this medication must be arranged prior to her surgery.  Pt denies SI, HI, AVH, and SH. She denies any past or current verbal, emotional, physical, or sexual abuse. She denies any criminal charges pending, any court dates, or being on probation. She reports having a good appetite. She states she does not sleep well, as she wakes approximately every  three hours to use the restroom and then cannot get back to sleep so she walks around and stays awake for a while. She shares she can complete her ADLs independently, though she does use a cane to assist her in getting around and states she does avoid stairs when possible.  Pt is oriented x4 and her memory is in tact. Pt shares no use of substances, with the exception of ETOH. Pt shared to clinician that she drinks one glass of wine approximately every few months, though it should be noted that in the notes of her PCP she reported she drinks 4 glasses of wine a week.   Diagnosis: Anxiety  Past Medical History:  Past Medical History:  Diagnosis Date  . Asthma   . Breast cancer ([place]) Right  . COPD (chronic obstructive pulmonary disease) ([place])   . Personal history of radiation therapy     Past Surgical History:  Procedure Laterality Date  . ABDOMINAL HYSTERECTOMY    . APPENDECTOMY    . BREAST BIOPSY Right 2015   +  . BREAST BIOPSY Right 1975   neg  . BREAST LUMPECTOMY  1975  . CHOLECYSTECTOMY    . COLONOSCOPY     Scheduled for July 2018  . COLONOSCOPY WITH PROPOFOL N/A 08/14/2016   Procedure: COLONOSCOPY WITH PROPOFOL;  Surgeon: Manya Silvas, MD;  Location: [facility] ENDOSCOPY;  Service: Endoscopy;  Laterality: N/A;  . ESOPHAGOGASTRODUODENOSCOPY (EGD) WITH PROPOFOL N/A 08/14/2016   Procedure: ESOPHAGOGASTRODUODENOSCOPY (  EGD) WITH PROPOFOL;  Surgeon: Manya Silvas, MD;  Location: [facility] ENDOSCOPY;  Service: Endoscopy;  Laterality: N/A;    Family History:  Family History  Problem Relation Age of Onset  . Cancer Father   . Breast cancer Neg Hx     Social History:  reports that she quit smoking about 21 years ago. Her smoking use included cigarettes. She has a 15.00 pack-year smoking history. she has never used smokeless tobacco. She reports that she drinks alcohol. She reports that she does not use drugs.  Additional Social History:  Alcohol / Drug Use Pain Medications: Please  see MAR Prescriptions: Please see MAR Over the Counter: Please see MAR History of alcohol / drug use?: No history of alcohol / drug abuse Longest period of sobriety (when/how long): N/A  CIWA: CIWA-Ar BP: (!) 145/53 Pulse Rate: 73 COWS:    Allergies:  Allergies  Allergen Reactions  . Tape Rash    Home Medications:  (Not in a hospital admission)  OB/GYN Status:  No LMP recorded. Patient has had a hysterectomy.  General Assessment Data Location of Assessment: [facility] ED TTS Assessment: In system Is this a Tele or Face-to-Face Assessment?: Face-to-Face Is this an Initial Assessment or a Re-assessment for this encounter?: Initial Assessment Marital status: Married [place] name: Julie Mckinney Is patient pregnant?: No Pregnancy Status: No Living Arrangements: Spouse/significant other Can pt return to current living arrangement?: Yes Admission Status: Voluntary Is patient capable of signing voluntary admission?: Yes Referral Source: Self/Family/Friend Insurance type: Medicare  Medical Screening Exam ([place]) Medical Exam completed: Yes  Crisis Care Plan Living Arrangements: Spouse/significant other Legal Guardian: Other:(Self) Name of Psychiatrist: N/A Name of Therapist: N/A  Education Status Is patient currently in school?: No Current Grade: N/A Highest grade of school patient has completed: 2 yrs college Name of school: N/A Contact person: N/A  Risk to self with the past 6 months Suicidal Ideation: No Has patient been a risk to self within the past 6 months prior to admission? : No Suicidal Intent: No Has patient had any suicidal intent within the past 6 months prior to admission? : No Is patient at risk for suicide?: No Suicidal Plan?: No Has patient had any suicidal plan within the past 6 months prior to admission? : No Access to Means: No What has been your use of drugs/alcohol within the last 12 months?: N/A Previous Attempts/Gestures: No How many times?:  0 Other Self Harm Risks: N/A Triggers for Past Attempts: None known Intentional Self Injurious Behavior: None Family Suicide History: No Recent stressful life event(s): Other (Comment)(Cockroaches in home) Persecutory voices/beliefs?: No Depression: (Pt states cockroaches in home has caused depression) Depression Symptoms: Tearfulness Substance abuse history and/or treatment for substance abuse?: No Suicide prevention information given to non-admitted patients: Not applicable  Risk to Others within the past 6 months Homicidal Ideation: No Does patient have any lifetime risk of violence toward others beyond the six months prior to admission? : No Thoughts of Harm to Others: No Current Homicidal Intent: No Current Homicidal Plan: No Access to Homicidal Means: No Identified Victim: N/A History of harm to others?: No Assessment of Violence: On admission Violent Behavior Description: N/A Does patient have access to weapons?: No Criminal Charges Pending?: No Does patient have a court date: No Is patient on probation?: No  Psychosis Hallucinations: None noted Delusions: None noted  Mental Status Report Appearance/Hygiene: Meticulous, Unremarkable Eye Contact: Good Motor Activity: Agitation Speech: Unremarkable, Logical/coherent Level of Consciousness: Alert Mood: Anxious, Despair,  Helpless Affect: Irritable, Appropriate to circumstance Anxiety Level: Moderate Thought Processes: Relevant Judgement: Unimpaired Orientation: Person, Place, Time, Situation Obsessive Compulsive Thoughts/Behaviors: Moderate  Cognitive Functioning Concentration: Good Memory: Recent Intact, Remote Intact IQ: Average Insight: Fair Impulse Control: Fair Appetite: Good Weight Loss: 0 Weight Gain: 0 Sleep: No Change Total Hours of Sleep: 6 Vegetative Symptoms: None  ADLScreening [facility] Assessment Services) Patient's cognitive ability adequate to safely complete daily activities?: Yes Patient  able to express need for assistance with ADLs?: Yes Independently performs ADLs?: Yes (appropriate for developmental age)  Prior Inpatient Therapy Prior Inpatient Therapy: No Prior Therapy Dates: N/A Prior Therapy Facilty/Provider(s): N/A Reason for Treatment: N/A  Prior Outpatient Therapy Prior Outpatient Therapy: Yes Prior Therapy Dates: 5 years ago for 4 years Prior Therapy Facilty/Provider(s): Unknown Reason for Treatment: MH Does patient have an ACCT team?: No Does patient have Intensive In-House Services?  : No Does patient have [facility] services? : No Does patient have P4CC services?: No  ADL Screening (condition at time of admission) Patient's cognitive ability adequate to safely complete daily activities?: Yes Is the patient deaf or have difficulty hearing?: No Does the patient have difficulty seeing, even when wearing glasses/contacts?: No Does the patient have difficulty concentrating, remembering, or making decisions?: No Patient able to express need for assistance with ADLs?: Yes Does the patient have difficulty dressing or bathing?: No Independently performs ADLs?: Yes (appropriate for developmental age) Does the patient have difficulty walking or climbing stairs?: No  Home Assistive Devices/Equipment Home Assistive Devices/Equipment: Cane (specify quad or straight)    Abuse/Neglect Assessment (Assessment to be complete while patient is alone) Abuse/Neglect Assessment Can Be Completed: Yes Physical Abuse: Denies Verbal Abuse: Denies Sexual Abuse: Denies Exploitation of patient/patient's resources: Denies Self-Neglect: Denies Values / Beliefs Cultural Requests During Hospitalization: None Spiritual Requests During Hospitalization: None Consults Spiritual Care Consult Needed: No Social Work Consult Needed: No Regulatory affairs officer (For Healthcare) Does Patient Have a Medical Advance Directive?: No    Additional Information 1:1 In Past 12 Months?: No CIRT  Risk: No Elopement Risk: No Does patient have medical clearance?: Yes     Disposition:  Disposition Initial Assessment Completed for this Encounter: Yes Disposition of Patient: Outpatient treatment  On Site Evaluation by:   Reviewed with Physician:    Dannielle Burn 03/08/2017 2:32 AM

## 2017-03-08 NOTE — Discharge Instructions (Signed)
1. The psychiatrist has recommended that you start the following medicines: Remeron 30 mg nightly Atarax 25 mg every 8 hours as needed for anxiety 2.  Return to the ER for worsening symptoms, feelings of hurting yourself or others, or other concerns.

## 2017-03-08 NOTE — ED Notes (Signed)

## 2017-04-17 ENCOUNTER — Ambulatory Visit: Payer: Medicare Other | Admitting: Oncology

## 2017-04-22 ENCOUNTER — Encounter: Payer: Self-pay | Admitting: Oncology

## 2017-04-22 ENCOUNTER — Inpatient Hospital Stay: Payer: Medicare Other | Attending: Oncology | Admitting: Oncology

## 2017-04-22 DIAGNOSIS — Z79810 Long term (current) use of selective estrogen receptor modulators (SERMs): Secondary | ICD-10-CM | POA: Insufficient documentation

## 2017-04-22 DIAGNOSIS — J449 Chronic obstructive pulmonary disease, unspecified: Secondary | ICD-10-CM | POA: Diagnosis not present

## 2017-04-22 DIAGNOSIS — Z170 Estrogen receptor positive status [ER+]: Secondary | ICD-10-CM | POA: Insufficient documentation

## 2017-04-22 DIAGNOSIS — R634 Abnormal weight loss: Secondary | ICD-10-CM | POA: Insufficient documentation

## 2017-04-22 DIAGNOSIS — Z923 Personal history of irradiation: Secondary | ICD-10-CM | POA: Insufficient documentation

## 2017-04-22 DIAGNOSIS — C50911 Malignant neoplasm of unspecified site of right female breast: Secondary | ICD-10-CM | POA: Diagnosis present

## 2017-04-22 DIAGNOSIS — Z87891 Personal history of nicotine dependence: Secondary | ICD-10-CM | POA: Insufficient documentation

## 2017-04-22 MED ORDER — TAMOXIFEN CITRATE 20 MG PO TABS: 20.0000 mg | ORAL_TABLET | Freq: Every day | ORAL | 0 refills | Status: DC

## 2017-04-22 NOTE — Progress Notes (Signed)
No new changes noted today 

## 2017-04-24 NOTE — Progress Notes (Signed)
Hematology/Oncology Consult note [facility]  [phone] [phone]  Patient Care Team: Glendon Axe, MD as PCP - General (Internal Medicine)   Name of the patient: Julie Mckinney  [id number]  [birth year]   Date of visit: 04/24/17  Diagnosis- 1. H/o breast cancer now on tamoxifen  Chief complaint/ Reason for visit- routine f/u of breast cancer  Heme/Onc history: 1. Patient is a 83 year old female with a history of invasive lobular carcinoma of the right breast diagnosed in 2015 lumpectomy and adjuvant radiation therapy. She was found to have a 6 x 5 x 10 mm mass in her right breast on core biopsy that was ER/PR positive and HER-2/neu negative. She underwent lumpectomy and sentinel lymph node biopsy in November 2015 which showed T2 N0 invasive lobular carcinoma measuring 2.6 cm, low-grade. 0 out of 4 sentinel lymph nodes were involved. Focally positive margin with involvement of the pectoralis muscle alone. Reexcision was not pursued and she received radiation boost to this area. She was subsequently started on hormone therapy with letrozole but had problems tolerating it due to myalgias and arthralgias and is currently on tamoxifen. She has had osteopenia in the past and has had nontraumatic bilateral were fractures despite taking Fosamax. Last mammogram from 2017 was reportedly normal and she is due for one this year.   2. She recently moved here from [place] and notes that sometime in April 2018 she was in the hospital with severe diarrhea. She didn't know was weight from 135 pounds to 115 pounds. She is now back up to 118 pounds and is slowly trying to gain weight. She has been seen at [facility] clinic GI for the same and is scheduled to undergo EGD and colonoscopy next month. She also had a recent CT abdomen (I do not have those results with me to review) but notes from Dr. Durenda Age anxiety and that she had diverticulosis noted on CT  abdomen but no evidence of malignancy.  3. Patient also has a history of COPD due to prior smoking but is not currently on home oxygen. She is independent of her ADLs and IADLs   Interval history- overall she is doing well. Denies any fatigue, unintentional weight loss or aches or pains anywhere. She had prior weight loss last year but it resolved after her GI issues were sorted out  ECOG PS- 1 Pain scale- 0   Review of systems- Review of Systems  Constitutional: Negative for chills, fever, malaise/fatigue and weight loss.  HENT: Negative for congestion, ear discharge and nosebleeds.   Eyes: Negative for blurred vision.  Respiratory: Negative for cough, hemoptysis, sputum production, shortness of breath and wheezing.   Cardiovascular: Negative for chest pain, palpitations, orthopnea and claudication.  Gastrointestinal: Negative for abdominal pain, blood in stool, constipation, diarrhea, heartburn, melena, nausea and vomiting.  Genitourinary: Negative for dysuria, flank pain, frequency, hematuria and urgency.  Musculoskeletal: Negative for back pain, joint pain and myalgias.  Skin: Negative for rash.  Neurological: Negative for dizziness, tingling, focal weakness, seizures, weakness and headaches.  Endo/Heme/Allergies: Does not bruise/bleed easily.  Psychiatric/Behavioral: Negative for depression and suicidal ideas. The patient does not have insomnia.       Allergies  Allergen Reactions  . Tape Rash     Past Medical History:  Diagnosis Date  . Asthma   . Breast cancer ([place]) Right  . COPD (chronic obstructive pulmonary disease) ([place])   . Personal history of radiation therapy      Past Surgical  History:  Procedure Laterality Date  . ABDOMINAL HYSTERECTOMY    . APPENDECTOMY    . BREAST BIOPSY Right 2015   +  . BREAST BIOPSY Right 1975   neg  . BREAST LUMPECTOMY  1975  . CHOLECYSTECTOMY    . COLONOSCOPY     Scheduled for July 2018  . COLONOSCOPY WITH PROPOFOL N/A  08/14/2016   Procedure: COLONOSCOPY WITH PROPOFOL;  Surgeon: Manya Silvas, MD;  Location: [facility] ENDOSCOPY;  Service: Endoscopy;  Laterality: N/A;  . ESOPHAGOGASTRODUODENOSCOPY (EGD) WITH PROPOFOL N/A 08/14/2016   Procedure: ESOPHAGOGASTRODUODENOSCOPY (EGD) WITH PROPOFOL;  Surgeon: Manya Silvas, MD;  Location[facility]COPY;  Service: Endoscopy;  Laterality: N/A;    Social History   Socioeconomic History  . Marital status: Married    Spouse name: Not on file  . Number of children: Not on file  . Years of education: Not on file  . Highest education level: Not on file  Occupational History  . Not on file  Social Needs  . Financial resource strain: Not on file  . Food insecurity:    Worry: Not on file    Inability: Not on file  . Transportation needs:    Medical: Not on file    Non-medical: Not on file  Tobacco Use  . Smoking status: Former Smoker    Packs/day: 0.50    Years: 30.00    Pack years: 15.00    Types: Cigarettes    Last attempt to quit: 06/22/1995    Years since quitting: 21.8  . Smokeless tobacco: Never Used  Substance and Sexual Activity  . Alcohol use: Yes    Comment: Occasional  . Drug use: No  . Sexual activity: Not on file  Lifestyle  . Physical activity:    Days per week: Not on file    Minutes per session: Not on file  . Stress: Not on file  Relationships  . Social connections:    Talks on phone: Not on file    Gets together: Not on file    Attends religious service: Not on file    Active member of club or organization: Not on file    Attends meetings of clubs or organizations: Not on file    Relationship status: Not on file  . Intimate partner violence:    Fear of current or ex partner: Not on file    Emotionally abused: Not on file    Physically abused: Not on file    Forced sexual activity: Not on file  Other Topics Concern  . Not on file  Social History Narrative  . Not on file    Family History  Problem Relation Age of Onset  .  Cancer Father   . Breast cancer Neg Hx      Current Outpatient Medications:  .  albuterol (PROVENTIL HFA;VENTOLIN HFA) 108 (90 Base) MCG/ACT inhaler, Inhale 2 puffs into the lungs every 6 (six) hours as needed., Disp: , Rfl:  .  calcium carbonate (CALCIUM 600) 600 MG TABS tablet, Take 600 mg by mouth 2 (two) times daily., Disp: , Rfl:  .  diclofenac (VOLTAREN) 75 MG EC tablet, Take 75 mg by mouth 2 (two) times daily., Disp: , Rfl:  .  fluticasone (FLONASE) 50 MCG/ACT nasal spray, Place 1 spray into the nose as needed., Disp: , Rfl:  .  Fluticasone Furoate-Vilanterol (BREO ELLIPTA IN), Inhale 1 puff into the lungs daily., Disp: , Rfl:  .  GLUCOSAMINE SULFATE PO, Take 1 capsule by  mouth 2 (two) times daily., Disp: , Rfl:  .  hydrOXYzine (ATARAX/VISTARIL) 25 MG tablet, Take 1 tablet (25 mg total) by mouth every 8 (eight) hours as needed for anxiety., Disp: 30 tablet, Rfl: 0 .  levothyroxine (SYNTHROID, LEVOTHROID) 50 MCG tablet, Take 50 mcg by mouth daily., Disp: , Rfl:  .  mirtazapine (REMERON) 30 MG tablet, Take 1 tablet (30 mg total) by mouth at bedtime., Disp: 30 tablet, Rfl: 0 .  Multiple Vitamin (MULTI-VITAMINS) TABS, Take 1 tablet by mouth daily., Disp: , Rfl:  .  naproxen sodium (ANAPROX) 220 MG tablet, Take 220 mg by mouth at bedtime., Disp: , Rfl:  .  omeprazole (PRILOSEC) 20 MG capsule, Take 20 mg by mouth daily., Disp: , Rfl:  .  simvastatin (ZOCOR) 80 MG tablet, Take 80 mg by mouth daily., Disp: , Rfl:  .  tamoxifen (NOLVADEX) 20 MG tablet, Take 1 tablet (20 mg total) by mouth daily., Disp: 30 tablet, Rfl: 0 .  tiotropium (SPIRIVA) 18 MCG inhalation capsule, Place 1 capsule into inhaler and inhale as needed., Disp: , Rfl:  .  traMADol (ULTRAM) 50 MG tablet, Take 1 tablet (50 mg total) by mouth every 6 (six) hours as needed., Disp: 20 tablet, Rfl: 0 .  Venlafaxine HCl 225 MG TB24, Take 2.25 mg by mouth 2 (two) times daily., Disp: , Rfl:  .  Cholecalciferol (VITAMIN D3) 1000 units  CAPS, Take 1,000 mg by mouth daily., Disp: , Rfl:   Physical exam:  Vitals:   04/22/17 1436  BP: 126/75  Pulse: 75  Resp: 18  Temp: 98.3 F (36.8 C)  TempSrc: Tympanic  Weight: 125 lb 8 oz (56.9 kg)  Height: 5' (1.524 m)   Physical Exam  Constitutional: She is oriented to person, place, and time.  Thin elderly lady in no acute distress  HENT:  Head: Normocephalic and atraumatic.  Eyes: Pupils are equal, round, and reactive to light. EOM are normal.  Neck: Normal range of motion.  Cardiovascular: Normal rate, regular rhythm and normal heart sounds.  Pulmonary/Chest: Effort normal and breath sounds normal.  Abdominal: Soft. Bowel sounds are normal.  Neurological: She is alert and oriented to person, place, and time.  Skin: Skin is warm and dry.   Breast exam was performed in seated and lying down position. Patient is status post right lumpectomy with a well-healed surgical scar. No evidence of any palpable masses. No evidence of axillary adenopathy. No evidence of any palpable masses or lumps in the left breast. No evidence of leftt axillary adenopathy   CMP Latest Ref Rng & Units 03/07/2017  Glucose 65 - 99 mg/dL 115(H)  BUN 6 - 20 mg/dL 20  Creatinine 0.44 - 1.00 mg/dL 0.75  Sodium 135 - 145 mmol/L 142  Potassium 3.5 - 5.1 mmol/L 3.8  Chloride 101 - 111 mmol/L 111  CO2 22 - 32 mmol/L 25  Calcium 8.9 - 10.3 mg/dL 8.7(L)  Total Protein 6.5 - 8.1 g/dL 6.7  Total Bilirubin 0.3 - 1.2 mg/dL 0.5  Alkaline Phos 38 - 126 U/L 39  AST 15 - 41 U/L 28  ALT 14 - 54 U/L 19   CBC Latest Ref Rng & Units 03/07/2017  WBC 3.6 - 11.0 K/uL 5.1  Hemoglobin 12.0 - 16.0 g/dL 12.0  Hematocrit 35.0 - 47.0 % 36.1  Platelets 150 - 440 K/uL 227      Assessment and plan- Patient is a 83 y.o. female with Stage I invasive lobular carcinoma of the right  breast currently on tamoxifen  1. Breast cancer- clinically doing well. No evidence of recurrence on todays exam. Continue tamoxifen along with  calcium and vit D.  I will see her back in 6 months. Will consider getting bone density scan at that time   Visit Diagnosis 1. Malignant neoplasm of right breast in female, estrogen receptor positive, unspecified site of breast [facility])      Dr. Randa Evens, MD, MPH [facility] at [facility] [phone] 04/24/2017 3:54 PM

## 2017-05-02 DIAGNOSIS — D649 Anemia, unspecified: Secondary | ICD-10-CM | POA: Insufficient documentation

## 2017-05-12 DIAGNOSIS — Z8673 Personal history of transient ischemic attack (TIA), and cerebral infarction without residual deficits: Secondary | ICD-10-CM | POA: Insufficient documentation

## 2017-05-13 DIAGNOSIS — D62 Acute posthemorrhagic anemia: Secondary | ICD-10-CM | POA: Insufficient documentation

## 2017-05-13 DIAGNOSIS — G9782 Other postprocedural complications and disorders of nervous system: Secondary | ICD-10-CM | POA: Insufficient documentation

## 2017-06-12 ENCOUNTER — Telehealth: Payer: Self-pay | Admitting: *Deleted

## 2017-06-12 ENCOUNTER — Other Ambulatory Visit: Payer: Self-pay | Admitting: Oncology

## 2017-06-12 DIAGNOSIS — Z170 Estrogen receptor positive status [ER+]: Principal | ICD-10-CM

## 2017-06-12 DIAGNOSIS — C50911 Malignant neoplasm of unspecified site of right female breast: Secondary | ICD-10-CM

## 2017-06-12 NOTE — Telephone Encounter (Signed)
Duplicate request

## 2017-07-11 ENCOUNTER — Other Ambulatory Visit: Payer: Self-pay | Admitting: *Deleted

## 2017-07-11 DIAGNOSIS — Z170 Estrogen receptor positive status [ER+]: Principal | ICD-10-CM

## 2017-07-11 DIAGNOSIS — C50911 Malignant neoplasm of unspecified site of right female breast: Secondary | ICD-10-CM

## 2017-07-11 MED ORDER — TAMOXIFEN CITRATE 20 MG PO TABS: 20.0000 mg | ORAL_TABLET | Freq: Every day | ORAL | 3 refills | Status: DC

## 2017-07-11 MED ORDER — TAMOXIFEN CITRATE 20 MG PO TABS: 20.0000 mg | ORAL_TABLET | Freq: Every day | ORAL | 0 refills | Status: DC

## 2017-08-11 ENCOUNTER — Other Ambulatory Visit: Payer: Medicare Other

## 2017-08-13 ENCOUNTER — Other Ambulatory Visit: Payer: Self-pay | Admitting: Neurosurgery

## 2017-08-13 DIAGNOSIS — M5441 Lumbago with sciatica, right side: Secondary | ICD-10-CM

## 2017-08-13 DIAGNOSIS — Z981 Arthrodesis status: Secondary | ICD-10-CM

## 2017-08-13 DIAGNOSIS — G8929 Other chronic pain: Secondary | ICD-10-CM

## 2017-08-22 ENCOUNTER — Ambulatory Visit
Admission: RE | Admit: 2017-08-22 | Discharge: 2017-08-22 | Disposition: A | Discharge status: Home / Self Care | Payer: Medicare Other | Source: Ambulatory Visit | Attending: Oncology | Admitting: Oncology

## 2017-08-22 DIAGNOSIS — C50911 Malignant neoplasm of unspecified site of right female breast: Secondary | ICD-10-CM

## 2017-08-22 DIAGNOSIS — Z170 Estrogen receptor positive status [ER+]: Secondary | ICD-10-CM | POA: Insufficient documentation

## 2017-08-28 ENCOUNTER — Ambulatory Visit
Admission: RE | Admit: 2017-08-28 | Discharge: 2017-08-28 | Disposition: A | Discharge status: Home / Self Care | Payer: Medicare Other | Source: Ambulatory Visit | Attending: Neurosurgery | Admitting: Neurosurgery

## 2017-08-28 DIAGNOSIS — Z981 Arthrodesis status: Secondary | ICD-10-CM

## 2017-08-28 DIAGNOSIS — M5441 Lumbago with sciatica, right side: Secondary | ICD-10-CM | POA: Insufficient documentation

## 2017-08-28 DIAGNOSIS — M48061 Spinal stenosis, lumbar region without neurogenic claudication: Secondary | ICD-10-CM | POA: Insufficient documentation

## 2017-08-28 DIAGNOSIS — G8929 Other chronic pain: Secondary | ICD-10-CM | POA: Diagnosis present

## 2017-10-21 ENCOUNTER — Ambulatory Visit: Payer: Medicare Other | Admitting: Oncology

## 2017-10-23 ENCOUNTER — Ambulatory Visit (INDEPENDENT_AMBULATORY_CARE_PROVIDER_SITE_OTHER): Payer: Medicare Other | Admitting: Psychiatry

## 2017-10-23 ENCOUNTER — Encounter: Payer: Self-pay | Admitting: Psychiatry

## 2017-10-23 VITALS — BP 127/71 | HR 89 | Temp 99.0°F | Wt 123.6 lb

## 2017-10-23 DIAGNOSIS — G4701 Insomnia due to medical condition: Secondary | ICD-10-CM | POA: Diagnosis not present

## 2017-10-23 DIAGNOSIS — F419 Anxiety disorder, unspecified: Secondary | ICD-10-CM | POA: Diagnosis not present

## 2017-10-23 DIAGNOSIS — F330 Major depressive disorder, recurrent, mild: Secondary | ICD-10-CM | POA: Diagnosis not present

## 2017-10-23 MED ORDER — MIRTAZAPINE 15 MG PO TABS: 15.0000 mg | ORAL_TABLET | Freq: Every day | ORAL | 1 refills | Status: DC

## 2017-10-23 NOTE — Progress Notes (Signed)
Psychiatric Initial Adult Assessment   Patient Identification: Julie Mckinney MRN:  [id number] Date of Evaluation:  10/23/2017 Referral Source: Glendon Axe MD Chief Complaint:  ' I am here to establish care." Chief Complaint    Establish Care     Visit Diagnosis:    ICD-10-CM   1. MDD (major depressive disorder), recurrent episode, mild ([place]) F33.0   2. Anxiety disorder, unspecified type F41.9   3. Insomnia due to medical condition G47.01     History of Present Illness:  Julie Mckinney is an 83 yr old CF, lives in a senior living community in [place], married, has a history of depression, anxiety, COPD, chronic pain, rheumatoid arthritis, history of breast cancer in remission, presented to the clinic today to establish care.  Patient today reports that she has been struggling with some depressive symptoms since the past several months.  She reports her depressive symptoms this time started in April 2019 after she had back surgery.  She reports soon after the back surgery she had some complications.  She was told that she has a hairline fracture of her pelvis as well as some loose screws that needs to be fixed.  She reports she became extremely depressed and anxious thinking about all the pain that she went through to get the surgery done in the first place and was not looking forward to all these complications.  Patient reports she has upcoming appointments with her orthopedic provider to figure out what they can do to help her.  Patient describes her symptoms as sadness, some crying spells, low energy, sleep problems and so on.  She reports she struggles with the symptoms several times a week.  She denies any suicidality or homicidality.  She denies any perceptual disturbances.  She reports she is currently on Celexa as well as mirtazapine which she has been on since April 2019.  Patient also reports recent trauma.  She reports 18 months ago her daughter as well as 2 grandchildren went missing.   She reports her daughter was in a stressful relationship with her children's father.  She reports they kept asking patient as well as her husband for money.  Patient reports they reached a point that they could not give them anymore and soon after that her daughter and 2 grandchildren and missing.  The police was involved however they have no idea where she is.  Patient reports they went through a lot with all the police investigation and so on.  Patient reports she and her husband sold their home and moved to a senior living community in [place].  Patient reports she continues to struggle with her daughter's memory and wonders why it happened.  She does not know where they are and that has been very stressful.    She reports some anxiety symptoms on and off especially regarding her health.  She reports she does not have any panic attacks or anxiety attacks.  Patient reports sleep is affected.  She is on mirtazapine as well as melatonin 2.5 mg.  Patient however continues to struggle with sleep problems.  She reports a history of being treated by a psychiatrist in [place] 6 years ago.  She also reports seeing a therapist and a psychiatrist a month ago at [facility].  She reports she did not like the treatment she got at [facility] and hence decided to find a new provider.   Associated Signs/Symptoms: Depression Symptoms:  depressed mood, insomnia, anxiety, loss of energy/fatigue, disturbed sleep, (Hypo) Manic Symptoms:  denies Anxiety  Symptoms:  anxiety symptoms - situational Psychotic Symptoms:  denies PTSD Symptoms: Had a traumatic exposure:  as noted above  Past Psychiatric History: Patient denies any inpatient mental health admissions.  Patient reports a history of being treated by a psychiatrist in [place] 6 years ago for anxiety and depression.  She also went to [place] recently a month ago.  Patient denies any history of suicide attempts.  Previous Psychotropic Medications: Yes celexa, Remeron,  melatonin.  She does report being on medication 6 years ago for depression and anxiety but does not remember the names  Substance Abuse History in the last 12 months:  No.  Consequences of Substance Abuse: Negative  Past Medical History:  Past Medical History:  Diagnosis Date  . Anxiety   . Asthma   . Breast cancer ([place]) Right  . COPD (chronic obstructive pulmonary disease) ([place])   . Depression   . Personal history of radiation therapy     Past Surgical History:  Procedure Laterality Date  . ABDOMINAL HYSTERECTOMY    . APPENDECTOMY    . BACK SURGERY    . BREAST BIOPSY Right 2015   +  . BREAST BIOPSY Right 1975   neg  . BREAST LUMPECTOMY  1975  . CHOLECYSTECTOMY    . COLONOSCOPY     Scheduled for July 2018  . COLONOSCOPY WITH PROPOFOL N/A 08/14/2016   Procedure: COLONOSCOPY WITH PROPOFOL;  Surgeon: Manya Silvas, MD;  Location[facility] ENDOSCOPY;  Service: Endoscopy;  Laterality: N/A;  . ESOPHAGOGASTRODUODENOSCOPY (EGD) WITH PROPOFOL N/A 08/14/2016   Procedure: ESOPHAGOGASTRODUODENOSCOPY (EGD) WITH PROPOFOL;  Surgeon: Manya Silvas, MD;  Location: [facility] ENDOSCOPY;  Service: Endoscopy;  Laterality: N/A;    Family Psychiatric History: Denies mental illness in her family.  Family History:  Family History  Problem Relation Age of Onset  . Cancer Father   . Breast cancer Neg Hx     Social History:   Social History   Socioeconomic History  . Marital status: Married    Spouse name: ronald  . Number of children: 2  . Years of education: Not on file  . Highest education level: Some college, no degree  Occupational History  . Not on file  Social Needs  . Financial resource strain: Not hard at all  . Food insecurity:    Worry: Never true    Inability: Never true  . Transportation needs:    Medical: No    Non-medical: No  Tobacco Use  . Smoking status: Former Smoker    Packs/day: 0.50    Years: 30.00    Pack years: 15.00    Types: Cigarettes    Last attempt  to quit: 06/22/1995    Years since quitting: 22.3  . Smokeless tobacco: Never Used  Substance and Sexual Activity  . Alcohol use: Yes    Comment: Occasional  . Drug use: No  . Sexual activity: Not Currently  Lifestyle  . Physical activity:    Days per week: 0 days    Minutes per session: 0 min  . Stress: Very much  Relationships  . Social connections:    Talks on phone: Not on file    Gets together: Not on file    Attends religious service: Never    Active member of club or organization: No    Attends meetings of clubs or organizations: Never    Relationship status: Married  Other Topics Concern  . Not on file  Social History Narrative  . Not on file  Additional Social History: Patient and her husband has been married since the past 30 years or so.  She reports her husband is supportive.  They have a good relationship.  Patient has 2 children a son and a daughter.  Her son lives in [place].  Patient and her husband used to live in [place] in the past.  Her daughter and 2 grandchildren went missing 18 months ago and they have no idea where she is.  Patient and her husband currently lives in an independent senior living community at [facility] in [place].    Allergies:   Allergies  Allergen Reactions  . Anastrozole Other (See Comments)    Leg pain  . Letrozole     Other reaction(s): Unknown Leg pain  . Tape Rash    Metabolic Disorder Labs: No results found for: HGBA1C, MPG No results found for: PROLACTIN No results found for: CHOL, TRIG, HDL, CHOLHDL, VLDL, LDLCALC   Current Medications: Current Outpatient Medications  Medication Sig Dispense Refill  . albuterol (PROVENTIL HFA;VENTOLIN HFA) 108 (90 Base) MCG/ACT inhaler Inhale 2 puffs into the lungs every 6 (six) hours as needed.    . calcium carbonate (CALCIUM 600) 600 MG TABS tablet Take 600 mg by mouth 2 (two) times daily.    . Cholecalciferol (VITAMIN D3) 1000 units CAPS Take 1,000 mg by mouth daily.    .  citalopram (CELEXA) 20 MG tablet     . clobetasol cream (TEMOVATE) 0.05 %     . clotrimazole-betamethasone (LOTRISONE) cream Apply topically.    . diclofenac (VOLTAREN) 75 MG EC tablet Take 75 mg by mouth 2 (two) times daily.    . fluconazole (DIFLUCAN) 150 MG tablet     . fluticasone (FLONASE) 50 MCG/ACT nasal spray Place 1 spray into the nose as needed.    . Fluticasone Furoate-Vilanterol (BREO ELLIPTA IN) Inhale 1 puff into the lungs daily.    Marland Kitchen GLUCOSAMINE SULFATE PO Take 1 capsule by mouth 2 (two) times daily.    . hydrOXYzine (ATARAX/VISTARIL) 25 MG tablet Take 1 tablet (25 mg total) by mouth every 8 (eight) hours as needed for anxiety. 30 tablet 0  . levothyroxine (SYNTHROID, LEVOTHROID) 50 MCG tablet Take 50 mcg by mouth daily.    . Melatonin 5 MG TABS Take by mouth.    . metroNIDAZOLE (FLAGYL) 500 MG tablet     . Multiple Vitamin (MULTI-VITAMINS) TABS Take 1 tablet by mouth daily.    . naproxen sodium (ANAPROX) 220 MG tablet Take 220 mg by mouth at bedtime.    Marland Kitchen omeprazole (PRILOSEC) 20 MG capsule Take 20 mg by mouth daily.    . Potassium 99 MG TABS Take by mouth.    . simvastatin (ZOCOR) 80 MG tablet Take 80 mg by mouth daily.    . tamoxifen (NOLVADEX) 20 MG tablet Take 1 tablet (20 mg total) by mouth daily. 90 tablet 0  . tiotropium (SPIRIVA) 18 MCG inhalation capsule Place 1 capsule into inhaler and inhale as needed.    . traMADol (ULTRAM) 50 MG tablet Take 1 tablet (50 mg total) by mouth every 6 (six) hours as needed. 20 tablet 0  . mirtazapine (REMERON) 15 MG tablet Take 1 tablet (15 mg total) by mouth at bedtime. 30 tablet 1   No current facility-administered medications for this visit.     Neurologic: Headache: No Seizure: No Paresthesias:Yes  Musculoskeletal: Strength & Muscle Tone: decreased Gait & Station: walks with a cane Patient leans: N/A  Psychiatric Specialty Exam:  Review of Systems  Constitutional: Positive for malaise/fatigue.  Musculoskeletal:        Back pain , Limited mobility of Shoulder joints    Psychiatric/Behavioral: The patient is nervous/anxious.   All other systems reviewed and are negative.   Blood pressure 127/71, pulse 89, temperature 99 F (37.2 C), temperature source Oral, weight 123 lb 9.6 oz (56.1 kg).Body mass index is 24.14 kg/m.  General Appearance: Casual  Eye Contact:  Fair  Speech:  Clear and Coherent  Volume:  Normal  Mood:  Anxious and Dysphoric  Affect:  Appropriate  Thought Process:  Goal Directed and Descriptions of Associations: Intact  Orientation:  Full (Time, Place, and Person)  Thought Content:  Logical  Suicidal Thoughts:  No  Homicidal Thoughts:  No  Memory:  Immediate;   Fair Recent;   Fair Remote;   Fair  Judgement:  Fair  Insight:  Fair  Psychomotor Activity:  Normal  Concentration:  Concentration: Fair and Attention Span: Fair  Recall:  [employer] of Knowledge:Fair  Language: Fair  Akathisia:  No  Handed:  Right  AIMS (if indicated): na  Assets:  Communication Skills Desire for Improvement Social Support  ADL's:  Intact  Cognition: WNL  Sleep:  restless    Treatment Plan Summary:Zohar is an 83 year old Caucasian female, married, lives in a senior living community in [place], has a history of depression, sleep problems, COPD, chronic pain, rheumatoid arthritis, breast cancer in remission on tamoxifen treatment, presented to the clinic today to establish care.  Patient is biologically predisposed given her multiple medical problems.  She also has psychosocial stressors of going through complications of back surgery as well as her daughter and grandchildren going missing 18 months ago.  Patient does have good social support from her husband.  She is motivated to stay on medications as well as pursue psychotherapy.  Patient denies any suicidality or family history of mental health problems and denies any history of suicide attempts.  Plan as noted below. Medication management and  Plan as noted below Plan MDD Continue Celexa 20 mg p.o. daily Reduce mirtazapine to 15 mg p.o. nightly.  She was taking 30 mg.  Her dose reduced to address her sleep problems. PHQ 9 today 7. Will refer patient for psychotherapy with our therapist Ms. Alden Hipp since she has been dealing with several psychosocial stressors.  Anxiety unspecified Vistaril 25 mg as needed. Discussed to limit use and use it only severe anxiety sx.   For insomnia Reduce mirtazapine to 15 mg p.o. nightly Discussed with patient to follow a good sleep hygiene.  Printed out information and gave it to her. Patient also takes melatonin 2.5 mg.  Discussed with patient to increase the melatonin to 5 mg if she continues to struggle with sleep in spite of reducing the mirtazapine.  Patient with history of hypothyroidism- reports she is compliant with her Synthroid and reports her primary medical doctor has been monitoring her thyroid function.  Follow-up in clinic in 3-4 weeks or sooner if needed.  More than 50 % of the time was spent for psychoeducation and supportive psychotherapy and care coordination.  This note was generated in part or whole with voice recognition software. Voice recognition is usually quite accurate but there are transcription errors that can and very often do occur. I apologize for any typographical errors that were not detected and corrected.      Ursula Alert, MD 10/3/20193:37 PM

## 2017-10-23 NOTE — Patient Instructions (Signed)
Your Mirtazapine has been reduced to 15 mg tablet - please take it at bedtime.  You can also start taking Melatonin 5 mg at bedtime - 90 minutes before going to bed if this does not help.

## 2017-11-04 ENCOUNTER — Inpatient Hospital Stay: Payer: Medicare Other | Attending: Oncology | Admitting: Oncology

## 2017-11-04 ENCOUNTER — Encounter: Payer: Self-pay | Admitting: Oncology

## 2017-11-04 VITALS — BP 148/79 | HR 80 | Temp 99.2°F | Resp 18 | Ht 60.0 in | Wt 125.0 lb

## 2017-11-04 DIAGNOSIS — Z923 Personal history of irradiation: Secondary | ICD-10-CM

## 2017-11-04 DIAGNOSIS — J449 Chronic obstructive pulmonary disease, unspecified: Secondary | ICD-10-CM | POA: Insufficient documentation

## 2017-11-04 DIAGNOSIS — Z79899 Other long term (current) drug therapy: Secondary | ICD-10-CM

## 2017-11-04 DIAGNOSIS — F329 Major depressive disorder, single episode, unspecified: Secondary | ICD-10-CM | POA: Diagnosis not present

## 2017-11-04 DIAGNOSIS — F419 Anxiety disorder, unspecified: Secondary | ICD-10-CM | POA: Diagnosis not present

## 2017-11-04 DIAGNOSIS — Z87891 Personal history of nicotine dependence: Secondary | ICD-10-CM

## 2017-11-04 DIAGNOSIS — Z79810 Long term (current) use of selective estrogen receptor modulators (SERMs): Secondary | ICD-10-CM | POA: Diagnosis not present

## 2017-11-04 DIAGNOSIS — M8580 Other specified disorders of bone density and structure, unspecified site: Secondary | ICD-10-CM | POA: Diagnosis not present

## 2017-11-04 DIAGNOSIS — Z780 Asymptomatic menopausal state: Secondary | ICD-10-CM

## 2017-11-04 DIAGNOSIS — Z170 Estrogen receptor positive status [ER+]: Secondary | ICD-10-CM | POA: Diagnosis not present

## 2017-11-04 DIAGNOSIS — Z08 Encounter for follow-up examination after completed treatment for malignant neoplasm: Secondary | ICD-10-CM

## 2017-11-04 DIAGNOSIS — C50911 Malignant neoplasm of unspecified site of right female breast: Secondary | ICD-10-CM | POA: Diagnosis present

## 2017-11-04 DIAGNOSIS — Z853 Personal history of malignant neoplasm of breast: Secondary | ICD-10-CM

## 2017-11-05 ENCOUNTER — Ambulatory Visit: Payer: Medicare Other | Admitting: Licensed Clinical Social Worker

## 2017-11-06 ENCOUNTER — Telehealth: Payer: Self-pay | Admitting: *Deleted

## 2017-11-06 NOTE — Telephone Encounter (Addendum)
[facility] to internal medicine.  Spoke with a member of Dr. Keturah Barre team.  Wanted to make the doctor aware that patient has osteopenia based on the last bone density done at [facility] clinic.  She is a breast cancer patient and she is on tamoxifen.  Because of this we are asking Dr. Candiss Norse to order a bone density in February or March 2020 which will be more than 18 months after her last one.  After taking this information to the doctor and they agree your do not agree have asked a staff member to call me back so I can follow-up on this for the patient

## 2017-11-07 NOTE — Progress Notes (Signed)
Hematology/Oncology Consult note [facility]  [phone] [phone]  Patient Care Team: Glendon Axe, MD as PCP - General (Internal Medicine)   Name of the patient: Julie Mckinney  [id number]  07-Oct-1934   Date of visit: 11/07/17  Diagnosis- 1. H/o breast cancer now on tamoxifen  Chief complaint/ Reason for visit-routine follow-up of breast cancer  Heme/Onc history: 1. Patient is a 83 year old female with a history of invasive lobular carcinoma of the right breast diagnosed in 2015 lumpectomy and adjuvant radiation therapy. She was found to have a 6 x 5 x 10 mm mass in her right breast on core biopsy that was ER/PR positive and HER-2/neu negative. She underwent lumpectomy and sentinel lymph node biopsy in November 2015 which showed T2 N0 invasive lobular carcinoma measuring 2.6 cm, low-grade. 0 out of 4 sentinel lymph nodes were involved. Focally positive margin with involvement of the pectoralis muscle alone. Reexcision was not pursued and she received radiation boost to this area. She was subsequently started on hormone therapy with letrozole but had problems tolerating it due to myalgias and arthralgias and is currently on tamoxifen. She has had osteopenia in the past and has had nontraumatic bilateral were fractures despite taking Fosamax.  She was then switched to tamoxifen  2. She recently moved here from [place] and notes that sometime in April 2018 she was in the hospital with severe diarrhea. She didn't know was weight from 135 pounds to 115 pounds. She is now back up to 118 pounds and is slowly trying to gain weight. She has been seen at [facility] GI for the same and is scheduled to undergo EGD and colonoscopy next month. She also had a recent CT abdomen (I do not have those results with me to review) but notes from Dr. Durenda Age anxiety and that she had diverticulosis noted on CT abdomen but no evidence of malignancy.  3.  Patient also has a history of COPD due to prior smoking but is not currently on home oxygen. She is independent of her ADLs and IADLs   Interval history-overall she is doing well and tolerating tamoxifen without any significant side effects.  Has a good appetite and denies any unintentional weight loss.  Denies any new aches or pains anywhere.  ECOG PS- 1 Pain scale- 0 Opioid associated constipation- no  Review of systems- Review of Systems  Constitutional: Negative for chills, fever, malaise/fatigue and weight loss.  HENT: Negative for congestion, ear discharge and nosebleeds.   Eyes: Negative for blurred vision.  Respiratory: Negative for cough, hemoptysis, sputum production, shortness of breath and wheezing.   Cardiovascular: Negative for chest pain, palpitations, orthopnea and claudication.  Gastrointestinal: Negative for abdominal pain, blood in stool, constipation, diarrhea, heartburn, melena, nausea and vomiting.  Genitourinary: Negative for dysuria, flank pain, frequency, hematuria and urgency.  Musculoskeletal: Negative for back pain, joint pain and myalgias.  Skin: Negative for rash.  Neurological: Negative for dizziness, tingling, focal weakness, seizures, weakness and headaches.  Endo/Heme/Allergies: Does not bruise/bleed easily.  Psychiatric/Behavioral: Negative for depression and suicidal ideas. The patient does not have insomnia.       Allergies  Allergen Reactions  . Anastrozole Other (See Comments)    Leg pain  . Letrozole     Other reaction(s): Unknown Leg pain  . Tape Rash     Past Medical History:  Diagnosis Date  . Anxiety   . Asthma   . Breast cancer ([place]) Right  . COPD (chronic obstructive  pulmonary disease) ([place])   . Depression   . Personal history of radiation therapy      Past Surgical History:  Procedure Laterality Date  . ABDOMINAL HYSTERECTOMY    . APPENDECTOMY    . BACK SURGERY    . BREAST BIOPSY Right 2015   +  . BREAST BIOPSY  Right 1975   neg  . BREAST LUMPECTOMY  1975  . CHOLECYSTECTOMY    . COLONOSCOPY     Scheduled for July 2018  . COLONOSCOPY WITH PROPOFOL N/A 08/14/2016   Procedure: COLONOSCOPY WITH PROPOFOL;  Surgeon: Manya Silvas, MD;  Location[facility] ENDOSCOPY;  Service: Endoscopy;  Laterality: N/A;  . ESOPHAGOGASTRODUODENOSCOPY (EGD) WITH PROPOFOL N/A 08/14/2016   Procedure: ESOPHAGOGASTRODUODENOSCOPY (EGD) WITH PROPOFOL;  Surgeon: Manya Silvas, MD;  Location[facility] ENDOSCOPY;  Service: Endoscopy;  Laterality: N/A;    Social History   Socioeconomic History  . Marital status: Married    Spouse name: ronald  . Number of children: 2  . Years of education: Not on file  . Highest education level: Some college, no degree  Occupational History  . Not on file  Social Needs  . Financial resource strain: Not hard at all  . Food insecurity:    Worry: Never true    Inability: Never true  . Transportation needs:    Medical: No    Non-medical: No  Tobacco Use  . Smoking status: Former Smoker    Packs/day: 0.50    Years: 30.00    Pack years: 15.00    Types: Cigarettes    Last attempt to quit: 06/22/1995    Years since quitting: 22.3  . Smokeless tobacco: Never Used  Substance and Sexual Activity  . Alcohol use: Yes    Comment: Occasional  . Drug use: No  . Sexual activity: Not Currently  Lifestyle  . Physical activity:    Days per week: 0 days    Minutes per session: 0 min  . Stress: Very much  Relationships  . Social connections:    Talks on phone: Not on file    Gets together: Not on file    Attends religious service: Never    Active member of club or organization: No    Attends meetings of clubs or organizations: Never    Relationship status: Married  . Intimate partner violence:    Fear of current or ex partner: No    Emotionally abused: No    Physically abused: No    Forced sexual activity: No  Other Topics Concern  . Not on file  Social History Narrative  . Not on file      Family History  Problem Relation Age of Onset  . Cancer Father   . Breast cancer Neg Hx      Current Outpatient Medications:  .  calcium carbonate (CALCIUM 600) 600 MG TABS tablet, Take 600 mg by mouth 2 (two) times daily., Disp: , Rfl:  .  Cholecalciferol (VITAMIN D3) 1000 units CAPS, Take 1,000 mg by mouth daily., Disp: , Rfl:  .  citalopram (CELEXA) 20 MG tablet, , Disp: , Rfl:  .  diclofenac (VOLTAREN) 75 MG EC tablet, Take 75 mg by mouth 2 (two) times daily., Disp: , Rfl:  .  fluconazole (DIFLUCAN) 150 MG tablet, , Disp: , Rfl:  .  Fluticasone Furoate-Vilanterol (BREO ELLIPTA IN), Inhale 1 puff into the lungs daily., Disp: , Rfl:  .  GLUCOSAMINE SULFATE PO, Take 1 capsule by mouth 2 (two) times  daily., Disp: , Rfl:  .  levothyroxine (SYNTHROID, LEVOTHROID) 50 MCG tablet, Take 50 mcg by mouth daily., Disp: , Rfl:  .  Melatonin 5 MG TABS, Take by mouth., Disp: , Rfl:  .  metroNIDAZOLE (FLAGYL) 500 MG tablet, , Disp: , Rfl:  .  mirtazapine (REMERON) 15 MG tablet, Take 1 tablet (15 mg total) by mouth at bedtime., Disp: 30 tablet, Rfl: 1 .  Multiple Vitamin (MULTI-VITAMINS) TABS, Take 1 tablet by mouth daily., Disp: , Rfl:  .  omeprazole (PRILOSEC) 20 MG capsule, Take 20 mg by mouth daily., Disp: , Rfl:  .  Potassium 99 MG TABS, Take by mouth., Disp: , Rfl:  .  simvastatin (ZOCOR) 80 MG tablet, Take 80 mg by mouth daily., Disp: , Rfl:  .  tamoxifen (NOLVADEX) 20 MG tablet, Take 1 tablet (20 mg total) by mouth daily., Disp: 90 tablet, Rfl: 0 .  albuterol (PROVENTIL HFA;VENTOLIN HFA) 108 (90 Base) MCG/ACT inhaler, Inhale 2 puffs into the lungs every 6 (six) hours as needed., Disp: , Rfl:  .  clobetasol cream (TEMOVATE) 0.05 %, , Disp: , Rfl:  .  clotrimazole-betamethasone (LOTRISONE) cream, Apply topically., Disp: , Rfl:  .  fluticasone (FLONASE) 50 MCG/ACT nasal spray, Place 1 spray into the nose as needed., Disp: , Rfl:  .  hydrOXYzine (ATARAX/VISTARIL) 25 MG tablet, Take 1 tablet  (25 mg total) by mouth every 8 (eight) hours as needed for anxiety. (Patient not taking: Reported on 11/04/2017), Disp: 30 tablet, Rfl: 0 .  naproxen sodium (ANAPROX) 220 MG tablet, Take 220 mg by mouth at bedtime., Disp: , Rfl:  .  tiotropium (SPIRIVA) 18 MCG inhalation capsule, Place 1 capsule into inhaler and inhale as needed., Disp: , Rfl:  .  traMADol (ULTRAM) 50 MG tablet, Take 1 tablet (50 mg total) by mouth every 6 (six) hours as needed. (Patient not taking: Reported on 11/04/2017), Disp: 20 tablet, Rfl: 0  Physical exam:  Vitals:   11/04/17 1121  BP: (!) 148/79  Pulse: 80  Resp: 18  Temp: 99.2 F (37.3 C)  TempSrc: Tympanic  SpO2: 96%  Weight: 125 lb (56.7 kg)  Height: 5' (1.524 m)   Physical Exam  Constitutional: She is oriented to person, place, and time.  Elderly woman in no acute distress  HENT:  Head: Normocephalic and atraumatic.  Eyes: Pupils are equal, round, and reactive to light. EOM are normal.  Neck: Normal range of motion.  Cardiovascular: Normal rate, regular rhythm and normal heart sounds.  Pulmonary/Chest: Effort normal and breath sounds normal.  Abdominal: Soft. Bowel sounds are normal.  Neurological: She is alert and oriented to person, place, and time.  Skin: Skin is warm and dry.     CMP Latest Ref Rng & Units 03/07/2017  Glucose 65 - 99 mg/dL 115(H)  BUN 6 - 20 mg/dL 20  Creatinine 0.44 - 1.00 mg/dL 0.75  Sodium 135 - 145 mmol/L 142  Potassium 3.5 - 5.1 mmol/L 3.8  Chloride 101 - 111 mmol/L 111  CO2 22 - 32 mmol/L 25  Calcium 8.9 - 10.3 mg/dL 8.7(L)  Total Protein 6.5 - 8.1 g/dL 6.7  Total Bilirubin 0.3 - 1.2 mg/dL 0.5  Alkaline Phos 38 - 126 U/L 39  AST 15 - 41 U/L 28  ALT 14 - 54 U/L 19   CBC Latest Ref Rng & Units 03/07/2017  WBC 3.6 - 11.0 K/uL 5.1  Hemoglobin 12.0 - 16.0 g/dL 12.0  Hematocrit 35.0 - 47.0 % 36.1  Platelets 150 - 440 K/uL 227      Assessment and plan- Patient is a 83 y.o. female with Stage I invasive lobular  carcinoma of the right breast currently on tamoxifen.  She is here for routine surveillance of her breast cancer  Recent bilateral mammogram from August 2019 revealed no evidence of malignancy.  She continues to tolerate tamoxifen well without any significant side effects.  I will obtain another bone density scan at this time.  I will defer breast exam today since she recently had a mammogram in August 2019.  I will see her back in 6 months to discuss the results of her bone density scan for a routine breast exam   Visit Diagnosis 1. Encounter for follow-up surveillance of breast cancer   2. Post-menopausal      Dr. Randa Evens, MD, MPH [facility] [phone] 11/07/2017 8:44 AM

## 2017-11-12 ENCOUNTER — Ambulatory Visit (INDEPENDENT_AMBULATORY_CARE_PROVIDER_SITE_OTHER): Payer: Medicare Other | Admitting: Licensed Clinical Social Worker

## 2017-11-12 ENCOUNTER — Encounter: Payer: Self-pay | Admitting: Licensed Clinical Social Worker

## 2017-11-12 DIAGNOSIS — F330 Major depressive disorder, recurrent, mild: Secondary | ICD-10-CM | POA: Diagnosis not present

## 2017-11-12 NOTE — Progress Notes (Signed)
Comprehensive Clinical Assessment (CCA) Note  11/12/2017 Julie Mckinney [id number]  Visit Diagnosis:      ICD-10-CM   1. MDD (major depressive disorder), recurrent episode, mild (HCC) F33.0       CCA Part One  Part One has been completed on paper by the patient.  (See scanned document in Chart Review)  CCA Part Two A  Intake/Chief Complaint:  CCA Intake With Chief Complaint CCA Part Two Date: 11/12/17 CCA Part Two Time: 1326 Chief Complaint/Presenting Problem: "The reason I came in, I'd had back surgery in April, and I was having trouble with my back. And, they found more discs that are cracked and it just really threw me off. But, then, I've seen him since then and he suggested getting a stimulator in my back. So, that isn't weighin on me anymore."  Patients Currently Reported Symptoms/Problems: "I'm still having some depression. Some days  are a little rocky. But, once I made my decision about my back, that's gotten 100% better (anxiety)."  Collateral Involvement: N/A Individual's Strengths: "I enjoy people. I enjoy walking around and talking with different people." Individual's Preferences: N/A Individual's Abilities: good communication  Type of Services Patient Feels Are Needed: medication management, individual therapy  Initial Clinical Notes/Concerns: N/A  Mental Health Symptoms Depression:  Depression: Fatigue, Increase/decrease in appetite, Irritability, Tearfulness, Weight gain/loss  Mania:  Mania: N/A  Anxiety:   Anxiety: Fatigue, Irritability  Psychosis:  Psychosis: N/A  Trauma:  Trauma: N/A  Obsessions:  Obsessions: N/A  Compulsions:  Compulsions: N/A  Inattention:  Inattention: N/A  Hyperactivity/Impulsivity:  Hyperactivity/Impulsivity: N/A  Oppositional/Defiant Behaviors:  Oppositional/Defiant Behaviors: N/A  Borderline Personality:  Emotional Irregularity: N/A  Other Mood/Personality Symptoms:  Other Mood/Personality Symtpoms: None reported    Mental Status  Exam Appearance and self-care  Stature:  Stature: Average  Weight:     Clothing:  Clothing: Neat/clean  Grooming:  Grooming: Well-groomed  Cosmetic use:  Cosmetic Use: None  Posture/gait:  Posture/Gait: Normal  Motor activity:  Motor Activity: Not Remarkable  Sensorium  Attention:  Attention: Normal  Concentration:  Concentration: Normal  Orientation:  Orientation: X5  Recall/memory:  Recall/Memory: Normal  Affect and Mood  Affect:  Affect: Appropriate  Mood:  Mood: Anxious  Relating  Eye contact:  Eye Contact: Normal  Facial expression:  Facial Expression: Anxious  Attitude toward examiner:  Attitude Toward Examiner: Cooperative  Thought and Language  Speech flow: Speech Flow: Normal  Thought content:  Thought Content: Appropriate to mood and circumstances  Preoccupation:  Preoccupations: (N/A)  Hallucinations:  Hallucinations: (N/A)  Organization:     Transport planner of Knowledge:  Fund of Knowledge: Average  Intelligence:  Intelligence: Average  Abstraction:  Abstraction: Normal  Judgement:  Judgement: Normal  Reality Testing:  Reality Testing: Realistic  Insight:  Insight: Good  Decision Making:  Decision Making: Normal  Social Functioning  Social Maturity:  Social Maturity: Responsible  Social Judgement:  Social Judgement: Normal  Stress  Stressors:  Stressors: Transitions, Illness  Coping Ability:  Coping Ability: Normal  Skill Deficits:     Supports:      Family and Psychosocial History: Family history Marital status: Married Number of Years Married: 49 What types of issues is patient dealing with in the relationship?: "No, none."  Additional relationship information: N/A Are you sexually active?: No What is your sexual orientation?: Heterosexual  Has your sexual activity been affected by drugs, alcohol, medication, or emotional stress?: N/A Does patient have children?: Yes How many children?:  2 How is patient's relationship with their  children?: Son (age 49) "Perfect.", Daughter (61) "She was married to a fella that was messing around with drugs. We kept on trying to help her, and then we got to about $25,000 we said the bank was closed. After we said that, we don't even know where she is. That was about 18 months ago."   Childhood History:  Childhood History By whom was/is the patient raised?: Both parents Additional childhood history information: "Very good."  Description of patient's relationship with caregiver when they were a child: Mom: "Good." Dad: "He was a little strict, but it was good."  Patient's description of current relationship with people who raised him/her: Both deceased.  How were you disciplined when you got in trouble as a child/adolescent?: "I don't really think that was an issue. My older brother was harder on them than I was."  Does patient have siblings?: Yes Number of Siblings: 1 Description of patient's current relationship with siblings: One older brother, currently deceased.  Did patient suffer any verbal/emotional/physical/sexual abuse as a child?: No Did patient suffer from severe childhood neglect?: No Has patient ever been sexually abused/assaulted/raped as an adolescent or adult?: No Was the patient ever a victim of a crime or a disaster?: No Witnessed domestic violence?: No Has patient been effected by domestic violence as an adult?: No  CCA Part Two B  Employment/Work Situation: Employment / Work Copywriter, advertising Employment situation: Retired Archivist job has been impacted by current illness: No What is the longest time patient has a held a job?: 22 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 [employer]  Did You Receive Any Psychiatric Treatment/Services While in the [employer]?: (N/A) Are There Guns or Other Weapons in [place]?: No Are These Psychologist, educational?: (N/A)  Education: Education School Currently Attending: N/A Last Grade Completed:  13 Name of [place]: [employer]  Did Teacher, adult education From [employer]?: Yes Did Physicist, medical?: Yes What Type of College Degree Do you Have?: Some college, no major Did Coal?: No What Was Your Major?: N/A Did You Have Any Special Interests In School?: "We all went out and did things together."  Did You Have An Individualized Education Program (IIEP): No Did You Have Any Difficulty At School?: No  Religion: Religion/Spirituality Are You A Religious Person?: Yes What is Your Religious Affiliation?: Methodist How Might This Affect Treatment?: N/A  Leisure/Recreation: Leisure / Recreation Leisure and Hobbies: "I cross stitch. We also do word puzzles."   Exercise/Diet: Exercise/Diet Do You Exercise?: Yes What Type of Exercise Do You Do?: Run/Walk How Many Times a Week Do You Exercise?: 6-7 times a week Have You Gained or Lost A Significant Amount of Weight in the Past Six Months?: Yes-Gained Number of Pounds Gained: 7 Do You Follow a Special Diet?: No Do You Have Any Trouble Sleeping?: No  CCA Part Two C  Alcohol/Drug Use: Alcohol / Drug Use Pain Medications: SEE MAR Prescriptions: SEE MAr Over the Counter: SEE MAR History of alcohol / drug use?: No history of alcohol / drug abuse                      CCA Part Three  ASAM's:  Six Dimensions of Multidimensional Assessment  Dimension 1:  Acute Intoxication and/or Withdrawal Potential:     Dimension 2:  Biomedical Conditions and Complications:     Dimension 3:  Emotional, Behavioral, or  Cognitive Conditions and Complications:     Dimension 4:  Readiness to Change:     Dimension 5:  Relapse, Continued use, or Continued Problem Potential:     Dimension 6:  Recovery/Living Environment:      Substance use Disorder (SUD)    Social Function:  Social Functioning Social Maturity: Responsible Social Judgement: Normal  Stress:  Stress Stressors: Transitions, Illness Coping  Ability: Normal Patient Takes Medications The Way The Doctor Instructed?: Yes Priority Risk: Low Acuity  Risk Assessment- Self-Harm Potential: Risk Assessment For Self-Harm Potential Thoughts of Self-Harm: No current thoughts Method: No plan Availability of Means: No access/NA Additional Information for Self-Harm Potential: (N/A) Additional Comments for Self-Harm Potential: None reported   Risk Assessment -Dangerous to Others Potential: Risk Assessment For Dangerous to Others Potential Method: No Plan Availability of Means: No access or NA Intent: Vague intent or NA Notification Required: No need or identified person Additional Comments for Danger to Others Potential: N/A  DSM5 Diagnoses: Patient Active Problem List   Diagnosis Date Noted  . Acute blood loss anemia 05/13/2017  . Postoperative CSF leak 05/13/2017  . History of CVA (cerebrovascular accident) without residual deficits 05/12/2017  . Anemia 05/02/2017  . Vitamin B 12 deficiency 10/28/2016  . Diarrhea of infectious origin 09/12/2016  . Acquired hypothyroidism 07/02/2016  . Chronic obstructive pulmonary disease ([place]) 07/02/2016  . Depression, major, single episode, complete remission ([place]) 07/02/2016  . Gastroesophageal reflux disease without esophagitis 07/02/2016  . Lumbar radiculopathy 07/02/2016  . Osteopenia of multiple sites 07/02/2016  . Pure hypercholesterolemia 07/02/2016  . History of diarrhea 06/10/2016  . Weight loss, abnormal 06/10/2016  . Abnormal CT scan, colon 06/10/2016  . Breast cancer of upper-outer quadrant of right female breast ([place]) 11/02/2013    Patient Centered Plan: Patient is on the following Treatment Plan(s):  Depression  Recommendations for Services/Supports/Treatments: Recommendations for Services/Supports/Treatments Recommendations For Services/Supports/Treatments: Medication Management, Individual Therapy  Treatment Plan Summary: Julie Mckinney reports significant improvement in her  depression since receiving news from her doctor regarding her back. She reports she would still like to attend therapy sessions 1x monthly to "keep a lid on this depression." I asked Julie Mckinney to keep a list over the next month of times she feels herself worry about her daughter. Additionally, as one of Julie Mckinney's goals for therapy is to be more patient with her husband, I asked her to keep a list of times her husband "annoys her," and end the list by writing three positive qualities he has, in an effort to decrease the frequency of arguments in their relationship.     Referrals to Alternative Service(s): Referred to Alternative Service(s):   Place:   Date:   Time:    Referred to Alternative Service(s):   Place:   Date:   Time:    Referred to Alternative Service(s):   Place:   Date:   Time:    Referred to Alternative Service(s):   Place:   Date:   Time:     Alden Hipp, LCSW

## 2017-11-13 ENCOUNTER — Ambulatory Visit (INDEPENDENT_AMBULATORY_CARE_PROVIDER_SITE_OTHER): Payer: Medicare Other | Admitting: Psychiatry

## 2017-11-13 ENCOUNTER — Encounter: Payer: Self-pay | Admitting: Psychiatry

## 2017-11-13 ENCOUNTER — Other Ambulatory Visit: Payer: Self-pay

## 2017-11-13 VITALS — BP 145/71 | HR 75 | Temp 99.1°F | Wt 126.0 lb

## 2017-11-13 DIAGNOSIS — G4701 Insomnia due to medical condition: Secondary | ICD-10-CM

## 2017-11-13 DIAGNOSIS — F330 Major depressive disorder, recurrent, mild: Secondary | ICD-10-CM | POA: Diagnosis not present

## 2017-11-13 DIAGNOSIS — F411 Generalized anxiety disorder: Secondary | ICD-10-CM | POA: Diagnosis not present

## 2017-11-13 MED ORDER — MIRTAZAPINE 15 MG PO TABS: 15.0000 mg | ORAL_TABLET | Freq: Every day | ORAL | 0 refills | Status: DC

## 2017-11-13 MED ORDER — CITALOPRAM HYDROBROMIDE 20 MG PO TABS: 20.0000 mg | ORAL_TABLET | Freq: Every day | ORAL | 0 refills | Status: DC

## 2017-11-13 NOTE — Progress Notes (Signed)
[place] MD OP Progress Note  11/13/2017 5:22 PM Julie Mckinney  MRN:  [id number]  Chief Complaint: ' I am here for follow up.' Chief Complaint    Follow-up; Medication Refill     HPI: Julie Mckinney is an 83 yr old CF, lives in a senior living community in [place], married, has a history of depression, anxiety, COPD, chronic pain, rheumatoid arthritis, history of breast cancer in remission on tamoxifen therapy, presented to the clinic today for a follow-up visit.  Patient today reports she has noticed improvement in her mood symptoms.  She reports improvement in her sadness, tiredness as well as negative self-image.  She reports she has more good days than bad days.  She reports sleep as improved.  She reports she sleeps up to 8 hours at night now.  She reports she is tolerating the reduced dose of mirtazapine as well as melatonin which was increased to 5 mg last visit.  She reports she continues to have good social support system.  She denies any suicidality or perceptual disturbances.  She reports she looks forward to her son who is going to visit her beginning of November.  Reports therapy visits as very effective and she will continue to follow-up with her therapist on a monthly basis.  Patient denies any other concerns today. Visit Diagnosis:    ICD-10-CM   1. MDD (major depressive disorder), recurrent episode, mild (HCC) F33.0 citalopram (CELEXA) 20 MG tablet    mirtazapine (REMERON) 15 MG tablet  2. GAD (generalized anxiety disorder) F41.1 citalopram (CELEXA) 20 MG tablet    mirtazapine (REMERON) 15 MG tablet  3. Insomnia due to medical condition G47.01 mirtazapine (REMERON) 15 MG tablet    Past Psychiatric History: Reviewed past psychiatric history from my progress note on 10/23/2017.  Past trials of Celexa, mirtazapine, melatonin.  Past Medical History:  Past Medical History:  Diagnosis Date  . Anxiety   . Asthma   . Breast cancer ([place]) Right  . COPD (chronic obstructive  pulmonary disease) ([place])   . Depression   . Personal history of radiation therapy     Past Surgical History:  Procedure Laterality Date  . ABDOMINAL HYSTERECTOMY    . APPENDECTOMY    . BACK SURGERY    . BREAST BIOPSY Right 2015   +  . BREAST BIOPSY Right 1975   neg  . BREAST LUMPECTOMY  1975  . CHOLECYSTECTOMY    . COLONOSCOPY     Scheduled for July 2018  . COLONOSCOPY WITH PROPOFOL N/A 08/14/2016   Procedure: COLONOSCOPY WITH PROPOFOL;  Surgeon: Manya Silvas, MD;  Location: [facility] ENDOSCOPY;  Service: Endoscopy;  Laterality: N/A;  . ESOPHAGOGASTRODUODENOSCOPY (EGD) WITH PROPOFOL N/A 08/14/2016   Procedure: ESOPHAGOGASTRODUODENOSCOPY (EGD) WITH PROPOFOL;  Surgeon: Manya Silvas, MD;  Location: [facility] ENDOSCOPY;  Service: Endoscopy;  Laterality: N/A;    Family Psychiatric History: Have reviewed family psychiatric history from my progress note on 10/23/2017  Family History:  Family History  Problem Relation Age of Onset  . Cancer Father   . Breast cancer Neg Hx     Social History: Reviewed social history from my progress note on 10/23/2017 Social History   Socioeconomic History  . Marital status: Married    Spouse name: ronald  . Number of children: 2  . Years of education: Not on file  . Highest education level: Some college, no degree  Occupational History  . Not on file  Social Needs  . Financial resource strain: Not hard at all  .  Food insecurity:    Worry: Never true    Inability: Never true  . Transportation needs:    Medical: No    Non-medical: No  Tobacco Use  . Smoking status: Former Smoker    Packs/day: 0.50    Years: 30.00    Pack years: 15.00    Types: Cigarettes    Last attempt to quit: 06/22/1995    Years since quitting: 22.4  . Smokeless tobacco: Never Used  Substance and Sexual Activity  . Alcohol use: Yes    Comment: Occasional  . Drug use: No  . Sexual activity: Not Currently  Lifestyle  . Physical activity:    Days per week: 0 days     Minutes per session: 0 min  . Stress: Very much  Relationships  . Social connections:    Talks on phone: Not on file    Gets together: Not on file    Attends religious service: Never    Active member of club or organization: No    Attends meetings of clubs or organizations: Never    Relationship status: Married  Other Topics Concern  . Not on file  Social History Narrative  . Not on file    Allergies:  Allergies  Allergen Reactions  . Anastrozole Other (See Comments)    Leg pain  . Letrozole     Other reaction(s): Unknown Leg pain  . Tape Rash    Metabolic Disorder Labs: No results found for: HGBA1C, MPG No results found for: PROLACTIN No results found for: CHOL, TRIG, HDL, CHOLHDL, VLDL, LDLCALC No results found for: TSH  Therapeutic Level Labs: No results found for: LITHIUM No results found for: VALPROATE No components found for:  CBMZ  Current Medications: Current Outpatient Medications  Medication Sig Dispense Refill  . albuterol (PROVENTIL HFA;VENTOLIN HFA) 108 (90 Base) MCG/ACT inhaler Inhale 2 puffs into the lungs every 6 (six) hours as needed.    Marland Kitchen amLODipine (NORVASC) 2.5 MG tablet Take 2.5 mg by mouth daily.    . calcium carbonate (CALCIUM 600) 600 MG TABS tablet Take 600 mg by mouth 2 (two) times daily.    . Cholecalciferol (VITAMIN D3) 1000 units CAPS Take 1,000 mg by mouth daily.    . citalopram (CELEXA) 20 MG tablet Take 1 tablet (20 mg total) by mouth daily. For mood 90 tablet 0  . clobetasol cream (TEMOVATE) 0.05 %     . clotrimazole-betamethasone (LOTRISONE) cream Apply topically.    . diclofenac (VOLTAREN) 75 MG EC tablet Take 75 mg by mouth 2 (two) times daily.    . fluconazole (DIFLUCAN) 150 MG tablet     . fluticasone (FLONASE) 50 MCG/ACT nasal spray Place 1 spray into the nose as needed.    . Fluticasone Furoate-Vilanterol (BREO ELLIPTA IN) Inhale 1 puff into the lungs daily.    Marland Kitchen GLUCOSAMINE SULFATE PO Take 1 capsule by mouth 2 (two) times  daily.    . hydrOXYzine (ATARAX/VISTARIL) 25 MG tablet Take 1 tablet (25 mg total) by mouth every 8 (eight) hours as needed for anxiety. 30 tablet 0  . levothyroxine (SYNTHROID, LEVOTHROID) 50 MCG tablet Take 50 mcg by mouth daily.    . Melatonin 5 MG TABS Take by mouth.    . metroNIDAZOLE (FLAGYL) 500 MG tablet     . mirtazapine (REMERON) 15 MG tablet Take 1 tablet (15 mg total) by mouth at bedtime. For sleep and mood 90 tablet 0  . Multiple Vitamin (MULTI-VITAMINS) TABS Take 1 tablet by  mouth daily.    . naproxen sodium (ANAPROX) 220 MG tablet Take 220 mg by mouth at bedtime.    Marland Kitchen omeprazole (PRILOSEC) 20 MG capsule Take 20 mg by mouth daily.    . Potassium 99 MG TABS Take by mouth.    . simvastatin (ZOCOR) 80 MG tablet Take 80 mg by mouth daily.    . tamoxifen (NOLVADEX) 20 MG tablet Take 1 tablet (20 mg total) by mouth daily. 90 tablet 0  . tiotropium (SPIRIVA) 18 MCG inhalation capsule Place 1 capsule into inhaler and inhale as needed.    . traMADol (ULTRAM) 50 MG tablet Take 1 tablet (50 mg total) by mouth every 6 (six) hours as needed. 20 tablet 0   No current facility-administered medications for this visit.      Musculoskeletal: Strength & Muscle Tone: within normal limits Gait & Station: normal Patient leans: N/A  Psychiatric Specialty Exam: Review of Systems  Psychiatric/Behavioral: Positive for depression (improving). The patient is nervous/anxious (improving).   All other systems reviewed and are negative.   Blood pressure (!) 145/71, pulse 75, temperature 99.1 F (37.3 C), temperature source Oral, weight 126 lb (57.2 kg).Body mass index is 24.61 kg/m.  General Appearance: Casual  Eye Contact:  Fair  Speech:  Clear and Coherent  Volume:  Normal  Mood:  Anxious  Affect:  Congruent  Thought Process:  Goal Directed and Descriptions of Associations: Intact  Orientation:  Full (Time, Place, and Person)  Thought Content: Logical   Suicidal Thoughts:  No  Homicidal  Thoughts:  No  Memory:  Immediate;   Fair Recent;   Fair Remote;   Fair  Judgement:  Fair  Insight:  Fair  Psychomotor Activity:  Normal  Concentration:  Concentration: Fair and Attention Span: Fair  Recall:  [employer] of Knowledge: Fair  Language: Fair  Akathisia:  No  Handed:  Right  AIMS (if indicated): na  Assets:  Communication Skills Desire for Improvement Social Support  ADL's:  Intact  Cognition: WNL  Sleep:  improved   Screenings:   Assessment and Plan: Aubrei is an 83 year old Caucasian female, married, lives in a senior living community in [place], has a history of depression, sleep problems, COPD, chronic pain, rheumatoid arthritis, breast cancer in remission on tamoxifen treatment, presented to the clinic today for a follow-up visit.  Patient is biologically predisposed given her multiple medical problems.  She also has psychosocial stressors of going through complications of back surgery as well as her daughter and grandchildren going missing 18 months ago.  Patient does have good social support from her husband.  She is currently tolerating her medications well with improvement.  She is also in psychotherapy sessions which are going well.  Will continue plan as noted below.  Plan MDD Continue Celexa 20 mg p.o. daily Continue reduced dosage of mirtazapine 15 mg p.o. nightly.  She was taking 30 mg before. Continue melatonin as prescribed. PHQ 9 today equals 4 She will continue psychotherapy with our therapist Ms. Cecilie Lowers.  Generalized anxiety disorder Continue hydroxyzine 25 mg as needed for severe anxiety symptoms.  Discussed with patient to limit use. Celexa 20 mg p.o. daily.   For insomnia Mirtazapine 15 mg p.o. nightly Melatonin 5 mg p.o. nightly.  Follow-up in clinic in 2 months or sooner if needed.  More than 50 % of the time was spent for psychoeducation and supportive psychotherapy and care coordination.  This note was generated in part or whole  with voice recognition  software. Voice recognition is usually quite accurate but there are transcription errors that can and very often do occur. I apologize for any typographical errors that were not detected and corrected.         Ursula Alert, MD 11/13/2017, 5:22 PM

## 2017-11-17 ENCOUNTER — Other Ambulatory Visit: Payer: Self-pay | Admitting: *Deleted

## 2017-11-17 DIAGNOSIS — C50911 Malignant neoplasm of unspecified site of right female breast: Secondary | ICD-10-CM

## 2017-11-17 DIAGNOSIS — Z170 Estrogen receptor positive status [ER+]: Principal | ICD-10-CM

## 2017-11-17 MED ORDER — TAMOXIFEN CITRATE 20 MG PO TABS: 20.0000 mg | ORAL_TABLET | Freq: Every day | ORAL | 0 refills | Status: DC

## 2017-12-09 ENCOUNTER — Ambulatory Visit: Payer: Medicare Other | Admitting: Licensed Clinical Social Worker

## 2018-01-05 ENCOUNTER — Ambulatory Visit: Payer: Medicare Other | Admitting: Licensed Clinical Social Worker

## 2018-01-19 ENCOUNTER — Encounter: Payer: Self-pay | Admitting: Psychiatry

## 2018-01-19 ENCOUNTER — Ambulatory Visit (INDEPENDENT_AMBULATORY_CARE_PROVIDER_SITE_OTHER): Payer: Medicare Other | Admitting: Psychiatry

## 2018-01-19 ENCOUNTER — Other Ambulatory Visit: Payer: Self-pay

## 2018-01-19 VITALS — BP 130/76 | HR 89 | Temp 98.8°F | Wt 125.2 lb

## 2018-01-19 DIAGNOSIS — G4701 Insomnia due to medical condition: Secondary | ICD-10-CM

## 2018-01-19 DIAGNOSIS — F330 Major depressive disorder, recurrent, mild: Secondary | ICD-10-CM | POA: Diagnosis not present

## 2018-01-19 DIAGNOSIS — F411 Generalized anxiety disorder: Secondary | ICD-10-CM | POA: Diagnosis not present

## 2018-01-19 MED ORDER — MIRTAZAPINE 15 MG PO TABS: 15.0000 mg | ORAL_TABLET | Freq: Every day | ORAL | 0 refills | Status: DC

## 2018-01-19 MED ORDER — CITALOPRAM HYDROBROMIDE 20 MG PO TABS: 30.0000 mg | ORAL_TABLET | Freq: Every day | ORAL | 0 refills | Status: DC

## 2018-01-19 NOTE — Progress Notes (Signed)
[place] MD OP Progress Note  01/19/2018 4:33 PM Julie Mckinney  MRN:  [id number]  Chief Complaint: ' I am here for follow up.' Chief Complaint    Follow-up; Medication Refill     HPI: Julie Mckinney is an 83 yr old Caucasian female, lives in a senior living community in [place], married, has a history of depression, anxiety, COPD, chronic pain, rheumatoid arthritis, history of breast cancer in remission on tamoxifen therapy, presented to the clinic today for a follow-up visit.  Patient today reports the holidays made her more overwhelmed these past few weeks.  She reports she has been taking more hydroxyzine as needed to cope with her anxiety symptoms.  She also is in pain, she has chronic back pain which could also be making her anxiety worse.  She reports she plans to go back to her pain provider.  She has been compliant with her Celexa as prescribed.  She denies any side effects.  She reports sleep is good.  She reports she is compliant on the mirtazapine as prescribed.  Patient denies any suicidality.  Patient denies any perceptual disturbances.  She continues to live at the senior living community and hence has social support system available.  She had to reschedule her last therapy visit since her husband was sick and she did not have transportation available.  She plans to return to see our therapist Ms. Cecilie Lowers soon. Visit Diagnosis:    ICD-10-CM   1. MDD (major depressive disorder), recurrent episode, mild (HCC) F33.0 citalopram (CELEXA) 20 MG tablet    mirtazapine (REMERON) 15 MG tablet  2. GAD (generalized anxiety disorder) F41.1 citalopram (CELEXA) 20 MG tablet    mirtazapine (REMERON) 15 MG tablet  3. Insomnia due to medical condition G47.01 mirtazapine (REMERON) 15 MG tablet    Past Psychiatric History: Reviewed past psychiatric history from my progress note on 10/23/2017.  Past trials of Celexa, mirtazapine, melatonin.  Past Medical History:  Past Medical History:  Diagnosis  Date  . Anxiety   . Asthma   . Breast cancer ([place]) Right  . COPD (chronic obstructive pulmonary disease) ([place])   . Depression   . Personal history of radiation therapy     Past Surgical History:  Procedure Laterality Date  . ABDOMINAL HYSTERECTOMY    . APPENDECTOMY    . BACK SURGERY    . BREAST BIOPSY Right 2015   +  . BREAST BIOPSY Right 1975   neg  . BREAST LUMPECTOMY  1975  . CHOLECYSTECTOMY    . COLONOSCOPY     Scheduled for July 2018  . COLONOSCOPY WITH PROPOFOL N/A 08/14/2016   Procedure: COLONOSCOPY WITH PROPOFOL;  Surgeon: Manya Silvas, MD;  Location: [facility] ENDOSCOPY;  Service: Endoscopy;  Laterality: N/A;  . ESOPHAGOGASTRODUODENOSCOPY (EGD) WITH PROPOFOL N/A 08/14/2016   Procedure: ESOPHAGOGASTRODUODENOSCOPY (EGD) WITH PROPOFOL;  Surgeon: Manya Silvas, MD;  Location: [facility] ENDOSCOPY;  Service: Endoscopy;  Laterality: N/A;    Family Psychiatric History: I have reviewed family psychiatric history from my progress note on 10/23/2017  Family History:  Family History  Problem Relation Age of Onset  . Cancer Father   . Breast cancer Neg Hx     Social History: Have reviewed social history from my progress note on 10/23/2017 Social History   Socioeconomic History  . Marital status: Married    Spouse name: ronald  . Number of children: 2  . Years of education: Not on file  . Highest education level: Some college, no degree  Occupational  History  . Not on file  Social Needs  . Financial resource strain: Not hard at all  . Food insecurity:    Worry: Never true    Inability: Never true  . Transportation needs:    Medical: No    Non-medical: No  Tobacco Use  . Smoking status: Former Smoker    Packs/day: 0.50    Years: 30.00    Pack years: 15.00    Types: Cigarettes    Last attempt to quit: 06/22/1995    Years since quitting: 22.5  . Smokeless tobacco: Never Used  Substance and Sexual Activity  . Alcohol use: Yes    Comment: Occasional  . Drug use: No   . Sexual activity: Not Currently  Lifestyle  . Physical activity:    Days per week: 0 days    Minutes per session: 0 min  . Stress: Very much  Relationships  . Social connections:    Talks on phone: Not on file    Gets together: Not on file    Attends religious service: Never    Active member of club or organization: No    Attends meetings of clubs or organizations: Never    Relationship status: Married  Other Topics Concern  . Not on file  Social History Narrative  . Not on file    Allergies:  Allergies  Allergen Reactions  . Anastrozole Other (See Comments)    Leg pain  . Letrozole     Other reaction(s): Unknown Leg pain  . Tape Rash    Metabolic Disorder Labs: No results found for: HGBA1C, MPG No results found for: PROLACTIN No results found for: CHOL, TRIG, HDL, CHOLHDL, VLDL, LDLCALC No results found for: TSH  Therapeutic Level Labs: No results found for: LITHIUM No results found for: VALPROATE No components found for:  CBMZ  Current Medications: Current Outpatient Medications  Medication Sig Dispense Refill  . albuterol (PROVENTIL HFA;VENTOLIN HFA) 108 (90 Base) MCG/ACT inhaler Inhale 2 puffs into the lungs every 6 (six) hours as needed.    Marland Kitchen amLODipine (NORVASC) 2.5 MG tablet Take 2.5 mg by mouth daily.    . calcium carbonate (CALCIUM 600) 600 MG TABS tablet Take 600 mg by mouth 2 (two) times daily.    . Cholecalciferol (VITAMIN D3) 1000 units CAPS Take 1,000 mg by mouth daily.    . citalopram (CELEXA) 20 MG tablet Take 1.5 tablets (30 mg total) by mouth daily. For mood 135 tablet 0  . clobetasol cream (TEMOVATE) 0.05 %     . clotrimazole-betamethasone (LOTRISONE) cream Apply topically.    . diclofenac (VOLTAREN) 75 MG EC tablet Take 75 mg by mouth 2 (two) times daily.    . fluconazole (DIFLUCAN) 150 MG tablet     . fluticasone (FLONASE) 50 MCG/ACT nasal spray Place 1 spray into the nose as needed.    . Fluticasone Furoate-Vilanterol (BREO ELLIPTA IN)  Inhale 1 puff into the lungs daily.    Marland Kitchen GLUCOSAMINE SULFATE PO Take 1 capsule by mouth 2 (two) times daily.    . hydrOXYzine (ATARAX/VISTARIL) 25 MG tablet Take 1 tablet (25 mg total) by mouth every 8 (eight) hours as needed for anxiety. 30 tablet 0  . levothyroxine (SYNTHROID, LEVOTHROID) 50 MCG tablet Take 50 mcg by mouth daily.    . Melatonin 5 MG TABS Take by mouth.    . metroNIDAZOLE (FLAGYL) 500 MG tablet     . mirtazapine (REMERON) 15 MG tablet Take 1 tablet (15 mg total) by  mouth at bedtime. For sleep and mood 90 tablet 0  . Multiple Vitamin (MULTI-VITAMINS) TABS Take 1 tablet by mouth daily.    . naproxen sodium (ANAPROX) 220 MG tablet Take 220 mg by mouth at bedtime.    Marland Kitchen omeprazole (PRILOSEC) 20 MG capsule Take 20 mg by mouth daily.    . Potassium 99 MG TABS Take by mouth.    . simvastatin (ZOCOR) 80 MG tablet Take 80 mg by mouth daily.    . tamoxifen (NOLVADEX) 20 MG tablet Take 1 tablet (20 mg total) by mouth daily. 90 tablet 0  . tiotropium (SPIRIVA) 18 MCG inhalation capsule Place 1 capsule into inhaler and inhale as needed.     No current facility-administered medications for this visit.      Musculoskeletal: Strength & Muscle Tone: within normal limits Gait & Station: walks with walker Patient leans: N/A  Psychiatric Specialty Exam: Review of Systems  Musculoskeletal: Positive for back pain.  Psychiatric/Behavioral: The patient is nervous/anxious.   All other systems reviewed and are negative.   Blood pressure 130/76, pulse 89, temperature 98.8 F (37.1 C), temperature source Oral, weight 125 lb 3.2 oz (56.8 kg).Body mass index is 24.45 kg/m.  General Appearance: Casual  Eye Contact:  Fair  Speech:  Clear and Coherent  Volume:  Normal  Mood:  Anxious  Affect:  Congruent  Thought Process:  Goal Directed and Descriptions of Associations: Intact  Orientation:  Full (Time, Place, and Person)  Thought Content: Logical   Suicidal Thoughts:  No  Homicidal  Thoughts:  No  Memory:  Immediate;   Fair Recent;   Fair Remote;   Fair  Judgement:  Fair  Insight:  Fair  Psychomotor Activity:  Normal  Concentration:  Concentration: Fair and Attention Span: Fair  Recall:  [employer] of Knowledge: Fair  Language: Fair  Akathisia:  No  Handed:  Right  AIMS (if indicated): denies tremors, rigidity,stiffness  Assets:  Communication Skills Desire for Improvement Social Support  ADL's:  Intact  Cognition: WNL  Sleep:  Fair   Screenings:   Assessment and Plan: Seeley is an 83 yr old Caucasian female, married, lives in a senior living community in [place] has a history of depression, sleep problems, COPD, chronic pain, rheumatoid arthritis, breast cancer in remission on tamoxifen treatment, presented to the clinic today for a follow-up visit.  Patient is biologically predisposed given her multiple medical problems.  She also has psychosocial stressors of going through complications of back surgery as well as her daughter and grandchildren going missing almost 2 years ago.  Patient continues to struggle with anxiety symptoms and will benefit from medication readjustment.  She will also continue to benefit from psychotherapy sessions.  Plan MDD Increase Celexa to 30 mg p.o. daily Continue reduced dosage of mirtazapine 15 mg p.o. nightly Melatonin as prescribed Continue psychotherapy with her therapist Ms. Cecilie Lowers.  Generalized anxiety disorder Hydroxyzine 25 mg p.o. 3 times daily as needed. Celexa 30 mg p.o. daily  For insomnia Mirtazapine 15 mg p.o. nightly Melatonin 5 mg p.o. nightly  Follow-up in clinic in 1 month or sooner if needed.  More than 50 % of the time was spent for psychoeducation and supportive psychotherapy and care coordination.  ,This note was generated in part or whole with voice recognition software. Voice recognition is usually quite accurate but there are transcription errors that can and very often do occur. I apologize  for any typographical errors that were not detected and corrected.  Ursula Alert, MD 01/19/2018, 4:33 PM

## 2018-02-05 ENCOUNTER — Encounter: Payer: Self-pay | Admitting: Licensed Clinical Social Worker

## 2018-02-05 ENCOUNTER — Ambulatory Visit (INDEPENDENT_AMBULATORY_CARE_PROVIDER_SITE_OTHER): Payer: Medicare Other | Admitting: Licensed Clinical Social Worker

## 2018-02-05 DIAGNOSIS — F330 Major depressive disorder, recurrent, mild: Secondary | ICD-10-CM | POA: Diagnosis not present

## 2018-02-05 DIAGNOSIS — Z8679 Personal history of other diseases of the circulatory system: Secondary | ICD-10-CM | POA: Insufficient documentation

## 2018-02-05 DIAGNOSIS — I10 Essential (primary) hypertension: Secondary | ICD-10-CM | POA: Insufficient documentation
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
## 2018-02-13 ENCOUNTER — Other Ambulatory Visit: Payer: Self-pay | Admitting: *Deleted

## 2018-02-13 DIAGNOSIS — C50911 Malignant neoplasm of unspecified site of right female breast: Secondary | ICD-10-CM

## 2018-02-13 DIAGNOSIS — Z170 Estrogen receptor positive status [ER+]: Principal | ICD-10-CM

## 2018-02-13 MED ORDER — TAMOXIFEN CITRATE 20 MG PO TABS: 20.0000 mg | ORAL_TABLET | Freq: Every day | ORAL | 0 refills | Status: DC

## 2018-02-13 NOTE — Addendum Note (Signed)
Addended by: Betti Cruz on: 02/13/2018 02:46 PM   Modules accepted: Orders

## 2018-02-19 ENCOUNTER — Encounter: Payer: Self-pay | Admitting: Psychiatry

## 2018-02-19 ENCOUNTER — Other Ambulatory Visit: Payer: Self-pay

## 2018-02-19 ENCOUNTER — Ambulatory Visit (INDEPENDENT_AMBULATORY_CARE_PROVIDER_SITE_OTHER): Payer: Medicare Other | Admitting: Psychiatry

## 2018-02-19 VITALS — BP 147/72 | HR 79 | Temp 99.0°F | Wt 121.6 lb

## 2018-02-19 DIAGNOSIS — G4701 Insomnia due to medical condition: Secondary | ICD-10-CM | POA: Diagnosis not present

## 2018-02-19 DIAGNOSIS — F411 Generalized anxiety disorder: Secondary | ICD-10-CM

## 2018-02-19 DIAGNOSIS — F330 Major depressive disorder, recurrent, mild: Secondary | ICD-10-CM | POA: Diagnosis not present
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
## 2018-04-20 ENCOUNTER — Other Ambulatory Visit: Payer: Self-pay | Admitting: Psychiatry

## 2018-04-20 DIAGNOSIS — F411 Generalized anxiety disorder: Secondary | ICD-10-CM

## 2018-04-20 DIAGNOSIS — F330 Major depressive disorder, recurrent, mild: Secondary | ICD-10-CM

## 2018-04-25 ENCOUNTER — Other Ambulatory Visit: Payer: Self-pay

## 2018-04-25 ENCOUNTER — Encounter: Payer: Self-pay | Admitting: Emergency Medicine

## 2018-04-25 ENCOUNTER — Emergency Department
Admission: EM | Admit: 2018-04-25 | Discharge: 2018-04-25 | Disposition: A | Discharge status: Home / Self Care | Payer: Medicare Other | Attending: Emergency Medicine | Admitting: Emergency Medicine

## 2018-04-25 DIAGNOSIS — Y999 Unspecified external cause status: Secondary | ICD-10-CM | POA: Diagnosis not present

## 2018-04-25 DIAGNOSIS — J449 Chronic obstructive pulmonary disease, unspecified: Secondary | ICD-10-CM | POA: Insufficient documentation

## 2018-04-25 DIAGNOSIS — W2209XA Striking against other stationary object, initial encounter: Secondary | ICD-10-CM | POA: Diagnosis not present

## 2018-04-25 DIAGNOSIS — S0101XA Laceration without foreign body of scalp, initial encounter: Secondary | ICD-10-CM

## 2018-04-25 DIAGNOSIS — Z79899 Other long term (current) drug therapy: Secondary | ICD-10-CM | POA: Diagnosis not present

## 2018-04-25 DIAGNOSIS — Z853 Personal history of malignant neoplasm of breast: Secondary | ICD-10-CM | POA: Diagnosis not present

## 2018-04-25 DIAGNOSIS — Y9389 Activity, other specified: Secondary | ICD-10-CM | POA: Diagnosis not present

## 2018-04-25 DIAGNOSIS — S0083XA Contusion of other part of head, initial encounter: Secondary | ICD-10-CM | POA: Diagnosis not present

## 2018-04-25 DIAGNOSIS — Y92009 Unspecified place in unspecified non-institutional (private) residence as the place of occurrence of the external cause: Secondary | ICD-10-CM | POA: Diagnosis not present

## 2018-04-25 DIAGNOSIS — E038 Other specified hypothyroidism: Secondary | ICD-10-CM | POA: Insufficient documentation

## 2018-04-25 DIAGNOSIS — S0181XA Laceration without foreign body of other part of head, initial encounter: Secondary | ICD-10-CM | POA: Insufficient documentation

## 2018-04-25 MED ORDER — LIDOCAINE-EPINEPHRINE-TETRACAINE (LET) SOLUTION
3.0000 mL | Freq: Once | NASAL | Status: AC
  Administered 2018-04-25: 3 mL via TOPICAL
  Filled 2018-04-25: qty 3

## 2018-04-25 MED ORDER — LIDOCAINE HCL (PF) 1 % IJ SOLN
5.0000 mL | Freq: Once | INTRAMUSCULAR | Status: DC
Start: 2018-04-25 — End: 2018-04-25
  Filled 2018-04-25: qty 5

## 2018-04-25 NOTE — Discharge Instructions (Signed)
Follow-up with your primary care provider if any continued problems or concerns.  Return to the emergency department if any signs of infection or urgent concerns.  Keep area clean and dry.  You may wash her hair a bit make sure that it is dried completely and is not allowed to stay wet.  The staples will need to be removed in 7 days.  You may have your nurse at the facility remove the staples.  The area that was glued on your face will peel off on its own.  Do not wash this area as it needs to stay dry to avoid the adhesive from breaking loose.  You may take Tylenol if needed for pain.  The bruising around your face and forehead will look worse tomorrow than it does currently.

## 2018-04-25 NOTE — ED Triage Notes (Addendum)
Was leaning forward to get something from dog and fell forward and hit head on hinge of door. Bleeding R scalp. Denies LOC. Denies use of blood thinners. Husband brought patient here, he is sitting in front of ED at table.

## 2018-04-25 NOTE — ED Provider Notes (Signed)
[facility] Emergency Department Provider Note  ____________________________________________   First MD Initiated Contact with Patient 04/25/18 1409     (approximate)  I have reviewed the triage vital signs and the nursing notes.   HISTORY  Chief Complaint Laceration   HPI Julie Mckinney is a 84 y.o. female presents to the ED with a laceration to her right scalp and to her lip.  Patient states that a neighbor was giving her some candy and some fell on the floor.  She was trying to pick it up before the dog got it and hit her head on the hinge of the door.  She denies any LOC, headache or visual changes.  Patient denies any use of blood thinners.  Patient was brought to the ED by her husband.  Tetanus is up-to-date.     Past Medical History:  Diagnosis Date  . Anxiety   . Asthma   . Breast cancer ([place]) Right  . COPD (chronic obstructive pulmonary disease) ([place])   . Depression   . Personal history of radiation therapy     Patient Active Problem List   Diagnosis Date Noted  . H/O orthostatic hypotension 02/05/2018  . Essential hypertension 02/05/2018  . Acute blood loss anemia 05/13/2017  . Postoperative CSF leak 05/13/2017  . History of CVA (cerebrovascular accident) without residual deficits 05/12/2017  . Anemia 05/02/2017  . Vitamin B 12 deficiency 10/28/2016  . Diarrhea of infectious origin 09/12/2016  . Acquired hypothyroidism 07/02/2016  . Chronic obstructive pulmonary disease ([place]) 07/02/2016  . Depression, major, single episode, complete remission ([place]) 07/02/2016  . Gastroesophageal reflux disease without esophagitis 07/02/2016  . Lumbar radiculopathy 07/02/2016  . Osteopenia of multiple sites 07/02/2016  . Pure hypercholesterolemia 07/02/2016  . History of diarrhea 06/10/2016  . Weight loss, abnormal 06/10/2016  . Abnormal CT scan, colon 06/10/2016  . Breast cancer of upper-outer quadrant of right female breast ([place]) 11/02/2013    Past Surgical History:  Procedure Laterality Date  . ABDOMINAL HYSTERECTOMY    . APPENDECTOMY    . BACK SURGERY    . BREAST BIOPSY Right 2015   +  . BREAST BIOPSY Right 1975   neg  . BREAST LUMPECTOMY  1975  . CHOLECYSTECTOMY    . COLONOSCOPY     Scheduled for July 2018  . COLONOSCOPY WITH PROPOFOL N/A 08/14/2016   Procedure: COLONOSCOPY WITH PROPOFOL;  Surgeon: Manya Silvas, MD;  Location: [facility] ENDOSCOPY;  Service: Endoscopy;  Laterality: N/A;  . ESOPHAGOGASTRODUODENOSCOPY (EGD) WITH PROPOFOL N/A 08/14/2016   Procedure: ESOPHAGOGASTRODUODENOSCOPY (EGD) WITH PROPOFOL;  Surgeon: Manya Silvas, MD;  Location: [facility] ENDOSCOPY;  Service: Endoscopy;  Laterality: N/A;    Prior to Admission medications   Medication Sig Start Date End Date Taking? Authorizing Provider  albuterol (PROVENTIL HFA;VENTOLIN HFA) 108 (90 Base) MCG/ACT inhaler Inhale 2 puffs into the lungs every 6 (six) hours as needed.    [provider]  amLODipine (NORVASC) 2.5 MG tablet Take 2.5 mg by mouth daily.    [provider]  Azelastine-Fluticasone 137-50 MCG/ACT SUSP Place into the nose. 01/27/18   [provider]  calcium carbonate (CALCIUM 600) 600 MG TABS tablet Take 600 mg by mouth 2 (two) times daily.    [provider]  Cholecalciferol (VITAMIN D3) 1000 units CAPS Take 1,000 mg by mouth daily.    [provider]  citalopram (CELEXA) 20 MG tablet TAKE 1.5 TABLETS BY MOUTH DAILY FOR MOOD 04/21/18   Ursula Alert, MD  clobetasol cream (TEMOVATE) 0.05 %  09/26/17   [provider]  clotrimazole-betamethasone (LOTRISONE) cream Apply topically. 07/29/17 07/29/18  [provider]  diclofenac (VOLTAREN) 75 MG EC tablet Take 75 mg by mouth 2 (two) times daily.    [provider]  fluconazole (DIFLUCAN) 150 MG tablet  09/10/17   [provider]  fluticasone (FLONASE) 50 MCG/ACT nasal spray Place 1 spray into the nose as needed.    [provider]  Fluticasone Furoate-Vilanterol (BREO ELLIPTA IN) Inhale 1 puff into the lungs daily.    [provider]  GLUCOSAMINE SULFATE PO Take 1 capsule by mouth 2 (two) times daily.    [provider]  hydrOXYzine (ATARAX/VISTARIL) 25 MG tablet Take 1 tablet (25 mg total) by mouth every 8 (eight) hours as needed for anxiety. 03/08/17   Paulette Blanch, MD  levothyroxine (SYNTHROID, LEVOTHROID) 50 MCG tablet Take 50 mcg by mouth daily.    [provider]  Melatonin 5 MG TABS Take by mouth.    [provider]  metroNIDAZOLE (FLAGYL) 500 MG tablet  08/27/17   [provider]  mirtazapine (REMERON) 15 MG tablet Take 1 tablet (15 mg total) by mouth at bedtime. For sleep and mood 01/19/18   Ursula Alert, MD  Multiple Vitamin (MULTI-VITAMINS) TABS Take 1 tablet by mouth daily.    [provider]  naproxen sodium (ANAPROX) 220 MG tablet Take 220 mg by mouth at bedtime.    [provider]  omeprazole (PRILOSEC) 20 MG capsule Take 20 mg by mouth daily.    [provider]  Potassium 99 MG TABS Take by mouth.    [provider]  simvastatin (ZOCOR) 80 MG tablet Take 80 mg by mouth daily.    [provider]  tamoxifen (NOLVADEX) 20 MG tablet Take 1 tablet (20 mg total) by mouth daily. 02/13/18   Sindy Guadeloupe, MD  tiotropium (SPIRIVA) 18 MCG inhalation capsule Place 1 capsule into inhaler and inhale as needed.    [provider]    Allergies Anastrozole; Letrozole; and Tape  Family History  Problem Relation Age of Onset  . Cancer Father   . Breast cancer Neg Hx     Social History Social History   Tobacco Use  . Smoking status: Former Smoker    Packs/day: 0.50    Years: 30.00    Pack years: 15.00    Types: Cigarettes    Last attempt to quit: 06/22/1995    Years since quitting: 22.8  . Smokeless tobacco: Never Used  Substance Use Topics  . Alcohol use: Yes    Comment: Occasional  . Drug use:  No    Review of Systems Constitutional: No fever/chills Eyes: No visual changes. ENT: No trauma. Cardiovascular: Denies chest pain. Respiratory: Denies shortness of breath. Gastrointestinal:   No nausea, no vomiting. Musculoskeletal: Negative for back pain. Skin: Laceration scalp and lip.  Neurological: Negative for headaches, focal weakness or numbness. ____________________________________________   PHYSICAL EXAM:  VITAL SIGNS: ED Triage Vitals  Enc Vitals Group     BP 04/25/18 1336 (!) 114/56     Pulse Rate 04/25/18 1336 72     Resp 04/25/18 1336 20     Temp 04/25/18 1336 97.7 F (36.5 C)     Temp Source 04/25/18 1336 Oral     SpO2 04/25/18 1336 97 %     Weight 04/25/18 1340 119 lb (54 kg)     Height 04/25/18 1340 5' (1.524  m)     Head Circumference --      Peak Flow --      Pain Score 04/25/18 1339 6     Pain Loc --      Pain Edu? --      Excl. in [place]? --    Constitutional: Alert and oriented. Well appearing and in no acute distress.  Patient answers questions appropriately. Eyes: Conjunctivae are normal. PERRL. EOMI. Head: Atraumatic. Nose: No congestion/rhinnorhea. Mouth/Throat: No dental trauma. Neck: No stridor.  Nontender cervical spine to palpation posteriorly. Cardiovascular: Normal rate, regular rhythm. Grossly normal heart sounds.  Good peripheral circulation. Respiratory: Normal respiratory effort.  No retractions. Lungs CTAB. Gastrointestinal: Soft and nontender. No distention. Musculoskeletal: No lower extremity tenderness nor edema.  No joint effusions. Neurologic:  Normal speech and language. No gross focal neurologic deficits are appreciated. No gait instability. Skin:  Skin is warm, dry.  Patient has laceration to the right scalp without active bleeding or foreign body.  She also has a 0.2 cm puncture just below the vermilion border of the lower lip without any active bleeding or foreign body present.  Patient also has an edematous area to her right  forehead with minimal ecchymosis.  Nontender palpation. Psychiatric: Mood and affect are normal. Speech and behavior are normal.  ____________________________________________   LABS (all labs ordered are listed, but only abnormal results are displayed)  Labs Reviewed - No data to display  PROCEDURES  Procedure(s) performed (including Critical Care):  Marland KitchenMarland KitchenLaceration Repair Date/Time: 04/25/2018 2:41 PM Performed by: Johnn Hai, PA-C Authorized by: Johnn Hai, PA-C   Consent:    Consent obtained:  Verbal   Consent given by:  Patient   Risks discussed:  Poor wound healing Anesthesia (see MAR for exact dosages):    Anesthesia method:  Topical application and local infiltration   Topical anesthetic:  LET   Local anesthetic:  Lidocaine 1% w/o epi Laceration details:    Location:  Scalp   Scalp location:  Frontal   Length (cm):  3 Repair type:    Repair type:  Simple Exploration:    Hemostasis achieved with:  LET   Contaminated: no   Treatment:    Area cleansed with:  Saline   Irrigation method:  Syringe   Visualized foreign bodies/material removed: no   Skin repair:    Repair method:  Staples   Number of staples:  4 Post-procedure details:    Dressing:  Open (no dressing)   Patient tolerance of procedure:  Tolerated well, no immediate complications .Marland KitchenLaceration Repair Date/Time: 04/25/2018 3:43 PM Performed by: Johnn Hai, PA-C Authorized by: Johnn Hai, PA-C   Consent:    Consent obtained:  Verbal   Consent given by:  Patient   Risks discussed:  Poor cosmetic result   Alternatives discussed:  No treatment Anesthesia (see MAR for exact dosages):    Anesthesia method:  Topical application   Topical anesthetic:  LET Laceration details:    Location:  Face   Face location:  Chin   Length (cm):  0.2 Repair type:    Repair type:  Simple Treatment:    Area cleansed with:  Saline   Amount of cleaning:  Standard   Irrigation solution:   Sterile saline   Irrigation method:  Tap   Visualized foreign bodies/material removed: no   Skin repair:    Repair method:  Tissue adhesive Approximation:    Approximation:  Close Post-procedure details:    Dressing:  Open (  no dressing)   Patient tolerance of procedure:  Tolerated well, no immediate complications   ___________________________________________   INITIAL IMPRESSION / ASSESSMENT AND PLAN / ED COURSE  As part of my medical decision making, I reviewed the following data within the [facility] Notes from prior ED visits and [place] Controlled Substance Database  Patient presents to the ED with a laceration to her scalp and to just below her lower lip margin with any foreign body or active bleeding.  Patient bent over to pick up a piece of candy that had dropped and hit her head on the door frame.  She denies any LOC and fall resulting from this injury.  Patient's tetanus is up-to-date.  Area was cleaned x2.  Scalp laceration was stapled without any difficulties.  The superficial 0.2 cm laceration to the facial area was Dermabond did.  Patient was given instructions on how to take care of both areas.  She is also given a staple remover so that if the rehab nurse that is at her assisted living who could remove these to avoid her having to go to the doctor's office or return to the emergency department.  She was given information to return to the ED if any signs of infection or urgent concerns.  ____________________________________________   FINAL CLINICAL IMPRESSION(S) / ED DIAGNOSES  Final diagnoses:  Scalp laceration, initial encounter  Laceration of skin of face, initial encounter  Contusion of forehead, initial encounter     ED Discharge Orders    None       Note:  This document was prepared using Dragon voice recognition software and may include unintentional dictation errors.    Johnn Hai, PA-C 04/25/18 [place], Catharine, MD  04/30/18 [phone]

## 2018-05-05 ENCOUNTER — Ambulatory Visit: Payer: Medicare Other | Admitting: Oncology

## 2018-05-11 ENCOUNTER — Other Ambulatory Visit: Payer: Self-pay | Admitting: *Deleted

## 2018-05-11 DIAGNOSIS — C50911 Malignant neoplasm of unspecified site of right female breast: Secondary | ICD-10-CM

## 2018-05-11 DIAGNOSIS — Z170 Estrogen receptor positive status [ER+]: Principal | ICD-10-CM

## 2018-05-11 MED ORDER — TAMOXIFEN CITRATE 20 MG PO TABS: 20.0000 mg | ORAL_TABLET | Freq: Every day | ORAL | 0 refills | Status: DC

## 2018-05-11 NOTE — Telephone Encounter (Signed)
Patient request refill of tamoxifen to be sent to [facility], last refill 02/13/18, #90.

## 2018-05-20 ENCOUNTER — Ambulatory Visit: Payer: Medicare Other | Admitting: Psychiatry

## 2018-07-22 ENCOUNTER — Other Ambulatory Visit: Payer: Self-pay | Admitting: Oncology

## 2018-07-22 DIAGNOSIS — Z853 Personal history of malignant neoplasm of breast: Secondary | ICD-10-CM

## 2018-08-03 ENCOUNTER — Other Ambulatory Visit: Payer: Self-pay | Admitting: Psychiatry

## 2018-08-03 DIAGNOSIS — F330 Major depressive disorder, recurrent, mild: Secondary | ICD-10-CM

## 2018-08-03 DIAGNOSIS — F411 Generalized anxiety disorder: Secondary | ICD-10-CM

## 2018-08-03 DIAGNOSIS — G4701 Insomnia due to medical condition: Secondary | ICD-10-CM

## 2018-08-04 DIAGNOSIS — Z0000 Encounter for general adult medical examination without abnormal findings: Secondary | ICD-10-CM | POA: Insufficient documentation

## 2018-08-06 ENCOUNTER — Other Ambulatory Visit (HOSPITAL_COMMUNITY): Payer: Self-pay | Admitting: Psychiatry

## 2018-08-06 DIAGNOSIS — F330 Major depressive disorder, recurrent, mild: Secondary | ICD-10-CM

## 2018-08-06 DIAGNOSIS — F411 Generalized anxiety disorder: Secondary | ICD-10-CM

## 2018-08-06 DIAGNOSIS — G4701 Insomnia due to medical condition: Secondary | ICD-10-CM

## 2018-08-06 MED ORDER — CITALOPRAM HYDROBROMIDE 20 MG PO TABS: 30.0000 mg | ORAL_TABLET | Freq: Every day | ORAL | 0 refills | Status: DC

## 2018-08-06 MED ORDER — MIRTAZAPINE 15 MG PO TABS: 15.0000 mg | ORAL_TABLET | Freq: Every day | ORAL | 0 refills | Status: DC

## 2018-08-06 NOTE — Telephone Encounter (Signed)
ordered

## 2018-08-06 NOTE — Telephone Encounter (Signed)
pt called she made an appt for next available 09-22-18 with dr. Shea Evans but patient needs enough medications to get to that appointment.

## 2018-08-24 ENCOUNTER — Ambulatory Visit
Admission: RE | Admit: 2018-08-24 | Discharge: 2018-08-24 | Disposition: A | Discharge status: Home / Self Care | Payer: Medicare Other | Source: Ambulatory Visit | Attending: Oncology | Admitting: Oncology

## 2018-08-24 ENCOUNTER — Other Ambulatory Visit: Payer: Self-pay

## 2018-08-24 DIAGNOSIS — Z853 Personal history of malignant neoplasm of breast: Secondary | ICD-10-CM

## 2018-08-31 ENCOUNTER — Other Ambulatory Visit: Payer: Self-pay

## 2018-09-01 ENCOUNTER — Inpatient Hospital Stay: Payer: Medicare Other | Admitting: Oncology

## 2018-09-08 ENCOUNTER — Encounter: Payer: Self-pay | Admitting: Oncology

## 2018-09-08 ENCOUNTER — Other Ambulatory Visit: Payer: Self-pay

## 2018-09-08 ENCOUNTER — Inpatient Hospital Stay: Payer: Medicare Other | Attending: Oncology | Admitting: Oncology

## 2018-09-08 VITALS — BP 127/73 | HR 76 | Temp 99.3°F | Resp 16 | Wt 131.0 lb

## 2018-09-08 DIAGNOSIS — Z170 Estrogen receptor positive status [ER+]: Secondary | ICD-10-CM | POA: Diagnosis not present

## 2018-09-08 DIAGNOSIS — C50911 Malignant neoplasm of unspecified site of right female breast: Secondary | ICD-10-CM | POA: Diagnosis present

## 2018-09-08 DIAGNOSIS — Z853 Personal history of malignant neoplasm of breast: Secondary | ICD-10-CM | POA: Diagnosis not present

## 2018-09-08 DIAGNOSIS — Z79810 Long term (current) use of selective estrogen receptor modulators (SERMs): Secondary | ICD-10-CM | POA: Insufficient documentation

## 2018-09-08 DIAGNOSIS — J449 Chronic obstructive pulmonary disease, unspecified: Secondary | ICD-10-CM | POA: Insufficient documentation

## 2018-09-08 DIAGNOSIS — F418 Other specified anxiety disorders: Secondary | ICD-10-CM | POA: Insufficient documentation

## 2018-09-08 DIAGNOSIS — Z923 Personal history of irradiation: Secondary | ICD-10-CM | POA: Insufficient documentation

## 2018-09-08 DIAGNOSIS — Z87891 Personal history of nicotine dependence: Secondary | ICD-10-CM | POA: Diagnosis not present

## 2018-09-08 DIAGNOSIS — Z5181 Encounter for therapeutic drug level monitoring: Secondary | ICD-10-CM | POA: Diagnosis not present

## 2018-09-08 DIAGNOSIS — Z08 Encounter for follow-up examination after completed treatment for malignant neoplasm: Secondary | ICD-10-CM

## 2018-09-08 DIAGNOSIS — M8580 Other specified disorders of bone density and structure, unspecified site: Secondary | ICD-10-CM | POA: Diagnosis not present

## 2018-09-08 DIAGNOSIS — Z79899 Other long term (current) drug therapy: Secondary | ICD-10-CM | POA: Diagnosis not present

## 2018-09-08 DIAGNOSIS — Z8781 Personal history of (healed) traumatic fracture: Secondary | ICD-10-CM | POA: Diagnosis not present

## 2018-09-08 NOTE — Progress Notes (Signed)
Pt here for f/u. She states she had mammogram 2 week ago and while getting mammogram her skin ripped under right breast. It was sore for about 10 days. Better now. She thinks she is coming to the end of tamoxifen soon. MD will let her know today

## 2018-09-09 NOTE — Progress Notes (Signed)
Hematology/Oncology Consult note [facility]  Telephone:([phone] [phone]  Patient Care Team: Glendon Axe, MD as PCP - General (Internal Medicine)   Name of the patient: Julie Mckinney  [id number]  July 09, 1934   Date of visit: 09/09/18  Diagnosis- h/o breast cancer on tamoxifen  Chief complaint/ Reason for visit- routine f.u of breast cancer  Heme/Onc history: 1. Patient is a 84 year old female with a history of invasive lobular carcinoma of the right breast diagnosed in 2015 lumpectomy and adjuvant radiation therapy. She was found to have a 6 x 5 x 10 mm mass in her right breast on core biopsy that was ER/PR positive and HER-2/neu negative. She underwent lumpectomy and sentinel lymph node biopsy in November 2015 which showed T2 N0 invasive lobular carcinoma measuring 2.6 cm, low-grade. 0 out of 4 sentinel lymph nodes were involved. Focally positive margin with involvement of the pectoralis muscle alone. Reexcision was not pursued and she received radiation boost to this area. She was subsequently started on hormone therapy with letrozole but had problems tolerating it due to myalgias and arthralgias and is currently on tamoxifen. She has had osteopenia in the past and has had nontraumatic bilateral were fractures despite taking Fosamax.  She was then switched to tamoxifen Interval history- overall she feels well. Her appetite is good. She recently underwent a mammogram and during that process had minor skin tear over her inframammary region which is now slowly healing  ECOG PS- 1 Pain scale- 0   Review of systems- Review of Systems  Constitutional: Negative for chills, fever, malaise/fatigue and weight loss.  HENT: Negative for congestion, ear discharge and nosebleeds.   Eyes: Negative for blurred vision.  Respiratory: Negative for cough, hemoptysis, sputum production, shortness of breath and wheezing.   Cardiovascular: Negative for chest  pain, palpitations, orthopnea and claudication.  Gastrointestinal: Negative for abdominal pain, blood in stool, constipation, diarrhea, heartburn, melena, nausea and vomiting.  Genitourinary: Negative for dysuria, flank pain, frequency, hematuria and urgency.  Musculoskeletal: Negative for back pain, joint pain and myalgias.  Skin: Negative for rash.  Neurological: Negative for dizziness, tingling, focal weakness, seizures, weakness and headaches.  Endo/Heme/Allergies: Does not bruise/bleed easily.  Psychiatric/Behavioral: Negative for depression and suicidal ideas. The patient does not have insomnia.        Allergies  Allergen Reactions  . Anastrozole Other (See Comments)    Leg pain  . Letrozole     Other reaction(s): Unknown Leg pain  . Tape Rash     Past Medical History:  Diagnosis Date  . Anxiety   . Asthma   . Breast cancer ([place]) Right  . COPD (chronic obstructive pulmonary disease) ([place])   . Depression   . Personal history of radiation therapy      Past Surgical History:  Procedure Laterality Date  . ABDOMINAL HYSTERECTOMY    . APPENDECTOMY    . BACK SURGERY    . BREAST BIOPSY Right 2015   +  . BREAST EXCISIONAL BIOPSY Right 1975   neg  . BREAST LUMPECTOMY  2015  . CHOLECYSTECTOMY    . COLONOSCOPY     Scheduled for July 2018  . COLONOSCOPY WITH PROPOFOL N/A 08/14/2016   Procedure: COLONOSCOPY WITH PROPOFOL;  Surgeon: Manya Silvas, MD;  Location: [facility] ENDOSCOPY;  Service: Endoscopy;  Laterality: N/A;  . ESOPHAGOGASTRODUODENOSCOPY (EGD) WITH PROPOFOL N/A 08/14/2016   Procedure: ESOPHAGOGASTRODUODENOSCOPY (EGD) WITH PROPOFOL;  Surgeon: Manya Silvas, MD;  Location[facility] ENDOSCOPY;  Service:  Endoscopy;  Laterality: N/A;    Social History   Socioeconomic History  . Marital status: Married    Spouse name: ronald  . Number of children: 2  . Years of education: Not on file  . Highest education level: Some college, no degree  Occupational History  .  Not on file  Social Needs  . Financial resource strain: Not hard at all  . Food insecurity    Worry: Never true    Inability: Never true  . Transportation needs    Medical: No    Non-medical: No  Tobacco Use  . Smoking status: Former Smoker    Packs/day: 0.50    Years: 30.00    Pack years: 15.00    Types: Cigarettes    Quit date: 06/22/1995    Years since quitting: 23.2  . Smokeless tobacco: Never Used  Substance and Sexual Activity  . Alcohol use: Yes    Alcohol/week: 1.0 standard drinks    Types: 1 Glasses of wine per week    Comment: Occasional  . Drug use: No  . Sexual activity: Not Currently  Lifestyle  . Physical activity    Days per week: 0 days    Minutes per session: 0 min  . Stress: Very much  Relationships  . Social Herbalist on phone: Not on file    Gets together: Not on file    Attends religious service: Never    Active member of club or organization: No    Attends meetings of clubs or organizations: Never    Relationship status: Married  . Intimate partner violence    Fear of current or ex partner: No    Emotionally abused: No    Physically abused: No    Forced sexual activity: No  Other Topics Concern  . Not on file  Social History Narrative  . Not on file    Family History  Problem Relation Age of Onset  . Cancer Father   . Breast cancer Neg Hx      Current Outpatient Medications:  .  albuterol (PROVENTIL HFA;VENTOLIN HFA) 108 (90 Base) MCG/ACT inhaler, Inhale 2 puffs into the lungs every 6 (six) hours as needed., Disp: , Rfl:  .  amLODipine (NORVASC) 2.5 MG tablet, Take 2.5 mg by mouth daily., Disp: , Rfl:  .  Azelastine-Fluticasone 137-50 MCG/ACT SUSP, Place 1 spray into the nose daily. , Disp: , Rfl:  .  calcium carbonate (CALCIUM 600) 600 MG TABS tablet, Take 600 mg by mouth 2 (two) times daily., Disp: , Rfl:  .  Cholecalciferol (VITAMIN D3) 1000 units CAPS, Take 1,000 mg by mouth daily., Disp: , Rfl:  .  citalopram (CELEXA)  20 MG tablet, Take 1.5 tablets (30 mg total) by mouth daily., Disp: 135 tablet, Rfl: 0 .  clobetasol cream (TEMOVATE) 0.05 %, , Disp: , Rfl:  .  clotrimazole-betamethasone (LOTRISONE) cream, Apply 1 application topically 2 (two) times daily., Disp: , Rfl:  .  diclofenac (VOLTAREN) 75 MG EC tablet, Take 75 mg by mouth 2 (two) times daily., Disp: , Rfl:  .  fluconazole (DIFLUCAN) 150 MG tablet, Take 150 mg by mouth once. , Disp: , Rfl:  .  fluticasone (FLONASE) 50 MCG/ACT nasal spray, Place 1 spray into the nose as needed., Disp: , Rfl:  .  Fluticasone Furoate-Vilanterol (BREO ELLIPTA IN), Inhale 1 puff into the lungs daily., Disp: , Rfl:  .  GLUCOSAMINE SULFATE PO, Take 1 capsule by mouth 2 (two) times  daily., Disp: , Rfl:  .  hydrOXYzine (ATARAX/VISTARIL) 25 MG tablet, Take 1 tablet (25 mg total) by mouth every 8 (eight) hours as needed for anxiety., Disp: 30 tablet, Rfl: 0 .  levothyroxine (SYNTHROID, LEVOTHROID) 50 MCG tablet, Take 50 mcg by mouth daily., Disp: , Rfl:  .  Melatonin 5 MG TABS, Take by mouth., Disp: , Rfl:  .  mirtazapine (REMERON) 15 MG tablet, Take 1 tablet (15 mg total) by mouth at bedtime. For sleep and mood, Disp: 90 tablet, Rfl: 0 .  Multiple Vitamin (MULTI-VITAMINS) TABS, Take 1 tablet by mouth daily., Disp: , Rfl:  .  omeprazole (PRILOSEC) 20 MG capsule, Take 20 mg by mouth daily., Disp: , Rfl:  .  Potassium 99 MG TABS, Take 1 tablet by mouth daily. , Disp: , Rfl:  .  simvastatin (ZOCOR) 80 MG tablet, Take 80 mg by mouth daily., Disp: , Rfl:  .  tamoxifen (NOLVADEX) 20 MG tablet, Take 1 tablet (20 mg total) by mouth daily., Disp: 90 tablet, Rfl: 0 .  tiotropium (SPIRIVA) 18 MCG inhalation capsule, Place 1 capsule into inhaler and inhale as needed., Disp: , Rfl:  .  traMADol (ULTRAM) 50 MG tablet, Take 50 mg by mouth every 6 (six) hours as needed., Disp: , Rfl:  .  vitamin B-12 (CYANOCOBALAMIN) 1000 MCG tablet, Take 1,000 mcg by mouth daily., Disp: , Rfl:   Physical exam:   Vitals:   09/08/18 1331  BP: 127/73  Pulse: 76  Resp: 16  Temp: 99.3 F (37.4 C)  TempSrc: Tympanic  Weight: 131 lb (59.4 kg)   Physical Exam HENT:     Head: Normocephalic and atraumatic.  Eyes:     Pupils: Pupils are equal, round, and reactive to light.  Neck:     Musculoskeletal: Normal range of motion.  Cardiovascular:     Rate and Rhythm: Normal rate and regular rhythm.     Heart sounds: Normal heart sounds.  Pulmonary:     Effort: Pulmonary effort is normal.     Breath sounds: Normal breath sounds.  Abdominal:     General: Bowel sounds are normal.     Palpations: Abdomen is soft.  Skin:    General: Skin is warm and dry.  Neurological:     Mental Status: She is alert and oriented to person, place, and time.    there is superficial skin excoriation under the right inframammary fold due to sweating and friction. No signs of infection  CMP Latest Ref Rng & Units 03/07/2017  Glucose 65 - 99 mg/dL 115(H)  BUN 6 - 20 mg/dL 20  Creatinine 0.44 - 1.00 mg/dL 0.75  Sodium 135 - 145 mmol/L 142  Potassium 3.5 - 5.1 mmol/L 3.8  Chloride 101 - 111 mmol/L 111  CO2 22 - 32 mmol/L 25  Calcium 8.9 - 10.3 mg/dL 8.7(L)  Total Protein 6.5 - 8.1 g/dL 6.7  Total Bilirubin 0.3 - 1.2 mg/dL 0.5  Alkaline Phos 38 - 126 U/L 39  AST 15 - 41 U/L 28  ALT 14 - 54 U/L 19   CBC Latest Ref Rng & Units 03/07/2017  WBC 3.6 - 11.0 K/uL 5.1  Hemoglobin 12.0 - 16.0 g/dL 12.0  Hematocrit 35.0 - 47.0 % 36.1  Platelets 150 - 440 K/uL 227    No images are attached to the encounter.  Mm Diag Breast Tomo Bilateral  Result Date: 08/24/2018 CLINICAL DATA:  History of right breast cancer status post lumpectomy in 2015. EXAM: DIGITAL DIAGNOSTIC  BILATERAL MAMMOGRAM WITH CAD AND TOMO COMPARISON:  Previous exam(s). ACR Breast Density Category c: The breast tissue is heterogeneously dense, which may obscure small masses. FINDINGS: Stable lumpectomy changes are seen in the right breast. No suspicious mass or  malignant type microcalcifications identified in either breast. Mammographic images were processed with CAD. IMPRESSION: No evidence of malignancy in either breast. RECOMMENDATION: Bilateral screening mammogram in 1 year is recommended. I have discussed the findings and recommendations with the patient. Results were also provided in writing at the conclusion of the visit. If applicable, a reminder letter will be sent to the patient regarding the next appointment. BI-RADS CATEGORY  2: Benign. Electronically Signed   By: Lillia Mountain M.D.   On: 08/24/2018 14:38     Assessment and plan- Patient is a 84 y.o. female with stage I invasive lobular carcinoma of right breast currently on tamoxifen. She is a routine f/u visit for breast cancer  1. Patient will continue tamoxifen until Jan 2021 which will mark 5 years of adjuvant endocrine therapy.  2. Recent mammogram from aug 2020 showed no malignancy.  3. She has baseline osteopenia not significantly worse on recent bone density test  I will see her back in 6 months. No labs   Visit Diagnosis 1. Encounter for follow-up surveillance of breast cancer   2. Encounter for monitoring tamoxifen therapy      Dr. Randa Evens, MD, MPH [facility] [phone] 09/09/2018 12:59 PM

## 2018-09-10 ENCOUNTER — Other Ambulatory Visit: Payer: Self-pay | Admitting: *Deleted

## 2018-09-10 DIAGNOSIS — C50911 Malignant neoplasm of unspecified site of right female breast: Secondary | ICD-10-CM

## 2018-09-10 MED ORDER — TAMOXIFEN CITRATE 20 MG PO TABS: 20.0000 mg | ORAL_TABLET | Freq: Every day | ORAL | 0 refills | Status: DC

## 2018-09-22 ENCOUNTER — Other Ambulatory Visit: Payer: Self-pay

## 2018-09-22 ENCOUNTER — Encounter: Payer: Self-pay | Admitting: Psychiatry

## 2018-09-22 ENCOUNTER — Ambulatory Visit (INDEPENDENT_AMBULATORY_CARE_PROVIDER_SITE_OTHER): Payer: Medicare Other | Admitting: Psychiatry

## 2018-09-22 DIAGNOSIS — F3341 Major depressive disorder, recurrent, in partial remission: Secondary | ICD-10-CM

## 2018-09-22 DIAGNOSIS — F411 Generalized anxiety disorder: Secondary | ICD-10-CM | POA: Diagnosis not present

## 2018-09-22 DIAGNOSIS — G4701 Insomnia due to medical condition: Secondary | ICD-10-CM | POA: Diagnosis not present

## 2018-09-22 DIAGNOSIS — F330 Major depressive disorder, recurrent, mild: Secondary | ICD-10-CM | POA: Insufficient documentation

## 2018-09-22 MED ORDER — HYDROXYZINE HCL 25 MG PO TABS: 25.0000 mg | ORAL_TABLET | Freq: Every day | ORAL | 1 refills | Status: DC | PRN

## 2018-09-22 MED ORDER — CITALOPRAM HYDROBROMIDE 20 MG PO TABS: 30.0000 mg | ORAL_TABLET | Freq: Every day | ORAL | 1 refills | Status: DC

## 2018-09-22 MED ORDER — MIRTAZAPINE 15 MG PO TABS: 15.0000 mg | ORAL_TABLET | Freq: Every day | ORAL | 1 refills | Status: DC
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
## 2018-12-08 DIAGNOSIS — Z66 Do not resuscitate: Secondary | ICD-10-CM | POA: Insufficient documentation

## 2018-12-08 DIAGNOSIS — Z862 Personal history of diseases of the blood and blood-forming organs and certain disorders involving the immune mechanism: Secondary | ICD-10-CM | POA: Insufficient documentation

## 2018-12-14 ENCOUNTER — Ambulatory Visit: Payer: Medicare Other | Attending: Family Medicine | Admitting: Physical Therapy

## 2018-12-14 ENCOUNTER — Encounter: Payer: Self-pay | Admitting: Physical Therapy

## 2018-12-14 ENCOUNTER — Other Ambulatory Visit: Payer: Self-pay

## 2018-12-14 DIAGNOSIS — M545 Low back pain: Secondary | ICD-10-CM | POA: Diagnosis present

## 2018-12-14 DIAGNOSIS — R296 Repeated falls: Secondary | ICD-10-CM | POA: Insufficient documentation

## 2018-12-14 DIAGNOSIS — G8929 Other chronic pain: Secondary | ICD-10-CM

## 2018-12-14 NOTE — Therapy (Addendum)
[facility] [address] [place], [address] Phone: [phone]   Fax:  [phone]  Physical Therapy Evaluation  Patient Details  Name: Julie Mckinney MRN: [id number] Date of Birth: [birth year] No data recorded  Encounter Date: 12/14/2018  PT End of Session - 12/14/18 1010    Visit Number  1    Number of Visits  17    Date for PT Re-Evaluation  02/08/19    PT Start Time  0930    PT Stop Time  1030    PT Time Calculation (min)  60 min    Activity Tolerance  Patient tolerated treatment well    Behavior During Therapy  [facility] for tasks assessed/performed       Past Medical History:  Diagnosis Date  . Anxiety   . Asthma   . Breast cancer ([place]) Right  . COPD (chronic obstructive pulmonary disease) ([place])   . Depression   . Personal history of radiation therapy     Past Surgical History:  Procedure Laterality Date  . ABDOMINAL HYSTERECTOMY    . APPENDECTOMY    . BACK SURGERY    . BREAST BIOPSY Right 2015   +  . BREAST EXCISIONAL BIOPSY Right 1975   neg  . BREAST LUMPECTOMY  2015  . CHOLECYSTECTOMY    . COLONOSCOPY     Scheduled for July 2018  . COLONOSCOPY WITH PROPOFOL N/A 08/14/2016   Procedure: COLONOSCOPY WITH PROPOFOL;  Surgeon: Manya Silvas, MD;  Location: [facility] ENDOSCOPY;  Service: Endoscopy;  Laterality: N/A;  . ESOPHAGOGASTRODUODENOSCOPY (EGD) WITH PROPOFOL N/A 08/14/2016   Procedure: ESOPHAGOGASTRODUODENOSCOPY (EGD) WITH PROPOFOL;  Surgeon: Manya Silvas, MD;  Location: [facility] ENDOSCOPY;  Service: Endoscopy;  Laterality: N/A;    There were no vitals filed for this visit.   OBJECTIVE  Mental Status Patient is oriented to person, place and time.  Recent memory is intact.  Remote memory is intact.  Attention span and concentration are intact.  Expressive speech is intact.  Patient's fund of knowledge is within normal limits for educational level.  SENSATION: Grossly intact  to light touch bilateral LEs as determined by testing dermatomes L2-S2 Proprioception and hot/cold testing deferred on this date   MUSCULOSKELETAL: Tremor: None Bulk: Normal Tone: Normal No visible step-off along spinal column  Posture Lumbar lordosis: WNL Iliac crest height: equal bilaterally Lumbar lateral shift: negative Lower crossed syndrome (tight hip flexors and erector spinae; weak gluts and abs): negative  Gait With SPC, LLE ER, wide BOS, trunk flexion, decreased rotation    Palpation Trigger points that pt reports is concordant sign, TTP at superior glute fibers   Strength (out of 5) R/L 4+/4+ Hip flexion 4-/4- Hip ER 5/5 Hip IR 3*/3+ Hip abduction 4/4- Hip adduction 3/3+ Hip extension 5/5 Knee extension 4+/4+ Knee flexion 5/5 Ankle dorsiflexion 5/5 Ankle plantarflexion  *Indicates pain   AROM (degrees) R/L (all movements include overpressure unless otherwise stated) Lumbar forward flexion (65): 25% limited Lumbar extension (30): 10d and painful Lumbar lateral flexion (25): R: 10% limited L: WNL Thoracic and Lumbar rotation (30 degrees):  R: 25% limited, mild pain with overpressure L: WNL All hip motion WNL, slight deficit at bilat ext   PROM (degrees) All hip motion WNL   Repeated Movements No centralization or peripheralization of symptoms with repeated lumbar extension or flexion.    Muscle Length 90/90 Positive bilat [place]: Positive bilat Obers positive bilat   Passive Accessory Intervertebral Motion (PAIVM)  Pt denies reproduction of back pain with CPA L1-L5 and UPA bilaterally L1-L5. Generally hypomobile throughout  Passive Physiological Intervertebral Motion (PPIVM) Normal flexion and extension with PPIVM testing   SPECIAL TESTS Lumbar Radiculopathy and Discogenic: Slump (SN 83, -LR 0.32): Negative bilat SLR (SN 92, -LR 0.29): Negative  Crossed SLR (SP 90): Negative  Lumbar Spinal Stenosis: Lumbar quadrant (SN 70):  Negative  Hip: FABER (SN 81): Positive bilat FADIR (SN 94): R: Negative L: Positive Hip scour (SN 50): Negative bilat  Functional Tasks 5xSTS without UE 20.24sec BERG 35/56 TUG 18sec  Ther-Ex Standing hip abd x5 each STS without UE x5 with cuing for full hip ext with good carry over Education on components of balance, deficits currently in strength and this impact on static, dynamic, and reactionary balance. Education on motion/soft tissue restriction and this impact on muscle activation and balance as well  ASSESSMENT Clinical Impression: Pt is a pleasant year-old female/female referred for low back pain. PT examination reveals deficits . Pt presents with deficits in strength, mobility, range of motion, and pain. Pt will benefit from skilled PT services to address deficits and return to pain-free function at home and work.        PT Long Term Goals - 12/14/18 1252      PT LONG TERM GOAL #1   Title  Patient will demonstrate BERG score of 45 or higher to demonstrate least fall risk category    Baseline  12/14/18 35/56    Time  8    Period  Weeks    Status  New      PT LONG TERM GOAL #2   Title  Patient will demonstrate TUG 12sec or less to demonstrate decreased fall risk category    Baseline  12/14/18 18sec    Time  8    Period  Weeks    Status  New      PT LONG TERM GOAL #3   Title  Patient will report being able to stand for 10 mins without support to cook an easy meal    Baseline  12/14/18 unable to stand for 45mins without support    Time  8    Period  Weeks    Status  New      PT LONG TERM GOAL #4   Title  Pt will decrease worst back pain as reported on NPRS by at least 2 points in order to demonstrate clinically significant reduction in back pain.    Baseline  12/14/18 8/10    Time  8    Period  Weeks    Status  New      PT LONG TERM GOAL #5   Title  Patient will demonstrate 5xSTS in 14 seconds or less to demonstrate age related norm strength    Baseline   12/14/18 20.24sec    Time  8    Period  Weeks    Status  New            Plan - 12/14/18 1258    Clinical Impression Statement  Patient is an 84 year old female presenting folowing frequent falls. Impairments in LBP, extensor strength deficits, hip ROM deficitis, lack of reactionary balance strategies, contributing to lossess of balance. Patient with difficulty with activities where she has to reach outside BOS to retrieve or put away items, prolonged standing, and rotating; inhibiting full participation in household cleaning/chores, and community activity with safety.Would benefit from skilled PT to address above deficits and promote optimal return  to PLOF.    Personal Factors and Comorbidities  Age;Comorbidity 1;Comorbidity 2;Fitness;Past/Current Experience;Comorbidity 3+    Comorbidities  HTN, COPD, arthritis, hx of lumbar surgery, hx breast cancer    Examination-Activity Limitations  Bathing;Locomotion Level;Reach Overhead;Stand;Lift;Bend    Examination-Participation Restrictions  Cleaning;Community Activity;Meal Prep    Stability/Clinical Decision Making  Evolving/Moderate complexity    Clinical Decision Making  Moderate    Rehab Potential  Good    PT Frequency  2x / week    PT Duration  8 weeks    PT Treatment/Interventions  ADLs/Self Care Home Management;Electrical Stimulation;Moist Heat;Traction;Ultrasound;Cryotherapy;Functional mobility training;Neuromuscular re-education;Spinal Manipulations;Dry needling;Joint Manipulations;Therapeutic activities;Therapeutic exercise;Patient/family education;Manual techniques;Balance training;Gait training;DME Instruction;Iontophoresis 4mg /ml Dexamethasone;Stair training;Passive range of motion    PT Next Visit Plan  gait speed, balance strategies    PT Home Exercise Plan  standing hip abd, STS    Consulted and Agree with Plan of Care  Patient       Patient will benefit from skilled therapeutic intervention in order to improve the following  deficits and impairments:  Impaired sensation, Increased muscle spasms, Difficulty walking, Decreased endurance, Decreased mobility, Decreased balance, Abnormal gait, Pain, Postural dysfunction, Impaired flexibility, Increased fascial restricitons, Decreased strength, Decreased coordination, Decreased activity tolerance, Decreased range of motion, Improper body mechanics  Visit Diagnosis: Repeated falls  Chronic left-sided low back pain without sciatica     Problem List Patient Active Problem List   Diagnosis Date Noted  . MDD (major depressive disorder), recurrent episode, mild ([place]) 09/22/2018  . GAD (generalized anxiety disorder) 09/22/2018  . Insomnia due to medical condition 09/22/2018  . Medicare annual wellness visit, initial 08/04/2018  . H/O orthostatic hypotension 02/05/2018  . Essential hypertension 02/05/2018  . Acute blood loss anemia 05/13/2017  . Postoperative CSF leak 05/13/2017  . History of CVA (cerebrovascular accident) without residual deficits 05/12/2017  . Anemia 05/02/2017  . Vitamin B 12 deficiency 10/28/2016  . Diarrhea of infectious origin 09/12/2016  . Acquired hypothyroidism 07/02/2016  . Chronic obstructive pulmonary disease ([place]) 07/02/2016  . Depression, major, single episode, complete remission ([place]) 07/02/2016  . Gastroesophageal reflux disease without esophagitis 07/02/2016  . Lumbar radiculopathy 07/02/2016  . Osteopenia of multiple sites 07/02/2016  . Pure hypercholesterolemia 07/02/2016  . History of diarrhea 06/10/2016  . Weight loss, abnormal 06/10/2016  . Abnormal CT scan, colon 06/10/2016  . Breast cancer of upper-outer quadrant of right female breast ([place]) 11/02/2013  . History of right breast cancer 11/02/2013   Shelton Silvas PT, DPT  Shelton Silvas 12/14/2018, 1:56 PM  [facility] [address] [place], [address] Phone: [phone]   Fax:   [phone]  Name: Bobby Murrah MRN: [id number] Date of Birth: August 11, 1934

## 2018-12-21 ENCOUNTER — Ambulatory Visit: Payer: Medicare Other | Admitting: Physical Therapy

## 2018-12-23 ENCOUNTER — Encounter: Payer: Medicare Other | Admitting: Physical Therapy

## 2018-12-28 ENCOUNTER — Encounter: Payer: Self-pay | Admitting: Physical Therapy

## 2018-12-28 ENCOUNTER — Other Ambulatory Visit: Payer: Self-pay

## 2018-12-28 ENCOUNTER — Ambulatory Visit: Payer: Medicare Other | Attending: Family Medicine | Admitting: Physical Therapy

## 2018-12-28 DIAGNOSIS — M545 Low back pain, unspecified: Secondary | ICD-10-CM

## 2018-12-28 DIAGNOSIS — G8929 Other chronic pain: Secondary | ICD-10-CM | POA: Diagnosis present

## 2018-12-28 DIAGNOSIS — R296 Repeated falls: Secondary | ICD-10-CM | POA: Insufficient documentation
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
## 2018-12-30 ENCOUNTER — Encounter: Payer: Self-pay | Admitting: Physical Therapy

## 2018-12-30 ENCOUNTER — Ambulatory Visit: Payer: Medicare Other | Admitting: Physical Therapy

## 2018-12-30 ENCOUNTER — Other Ambulatory Visit: Payer: Self-pay

## 2018-12-30 DIAGNOSIS — R296 Repeated falls: Secondary | ICD-10-CM

## 2018-12-30 DIAGNOSIS — G8929 Other chronic pain: Secondary | ICD-10-CM
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
## 2019-01-04 ENCOUNTER — Ambulatory Visit: Payer: Medicare Other | Admitting: Physical Therapy

## 2019-01-05 ENCOUNTER — Encounter: Payer: Medicare Other | Admitting: Physical Therapy

## 2019-01-06 ENCOUNTER — Ambulatory Visit: Payer: Medicare Other | Admitting: Physical Therapy

## 2019-01-06 ENCOUNTER — Other Ambulatory Visit: Payer: Self-pay

## 2019-01-06 ENCOUNTER — Ambulatory Visit (INDEPENDENT_AMBULATORY_CARE_PROVIDER_SITE_OTHER): Payer: Medicare Other | Admitting: Psychiatry

## 2019-01-06 ENCOUNTER — Encounter: Payer: Self-pay | Admitting: Psychiatry

## 2019-01-06 DIAGNOSIS — G4701 Insomnia due to medical condition: Secondary | ICD-10-CM | POA: Diagnosis not present

## 2019-01-06 DIAGNOSIS — F3341 Major depressive disorder, recurrent, in partial remission: Secondary | ICD-10-CM | POA: Insufficient documentation

## 2019-01-06 DIAGNOSIS — F411 Generalized anxiety disorder: Secondary | ICD-10-CM

## 2019-01-06 DIAGNOSIS — F3342 Major depressive disorder, recurrent, in full remission: Secondary | ICD-10-CM | POA: Diagnosis not present
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
## 2019-01-07 ENCOUNTER — Encounter: Payer: Medicare Other | Admitting: Physical Therapy

## 2019-01-11 ENCOUNTER — Encounter: Payer: Medicare Other | Admitting: Physical Therapy

## 2019-01-13 ENCOUNTER — Encounter: Payer: Medicare Other | Admitting: Physical Therapy

## 2019-01-17 ENCOUNTER — Emergency Department: Payer: Medicare Other

## 2019-01-17 ENCOUNTER — Encounter: Payer: Self-pay | Admitting: Emergency Medicine

## 2019-01-17 ENCOUNTER — Observation Stay
Admission: EM | Admit: 2019-01-17 | Discharge: 2019-01-19 | Disposition: A | Discharge status: Home / Self Care | Payer: Medicare Other | Attending: Internal Medicine | Admitting: Internal Medicine

## 2019-01-17 ENCOUNTER — Other Ambulatory Visit: Payer: Self-pay

## 2019-01-17 DIAGNOSIS — Z66 Do not resuscitate: Secondary | ICD-10-CM | POA: Insufficient documentation

## 2019-01-17 DIAGNOSIS — M6281 Muscle weakness (generalized): Secondary | ICD-10-CM | POA: Insufficient documentation

## 2019-01-17 DIAGNOSIS — F3341 Major depressive disorder, recurrent, in partial remission: Secondary | ICD-10-CM | POA: Insufficient documentation

## 2019-01-17 DIAGNOSIS — R911 Solitary pulmonary nodule: Secondary | ICD-10-CM | POA: Insufficient documentation

## 2019-01-17 DIAGNOSIS — K449 Diaphragmatic hernia without obstruction or gangrene: Secondary | ICD-10-CM | POA: Insufficient documentation

## 2019-01-17 DIAGNOSIS — K5730 Diverticulosis of large intestine without perforation or abscess without bleeding: Secondary | ICD-10-CM | POA: Diagnosis not present

## 2019-01-17 DIAGNOSIS — E785 Hyperlipidemia, unspecified: Secondary | ICD-10-CM | POA: Insufficient documentation

## 2019-01-17 DIAGNOSIS — M47814 Spondylosis without myelopathy or radiculopathy, thoracic region: Secondary | ICD-10-CM | POA: Diagnosis not present

## 2019-01-17 DIAGNOSIS — Z853 Personal history of malignant neoplasm of breast: Secondary | ICD-10-CM | POA: Diagnosis not present

## 2019-01-17 DIAGNOSIS — K219 Gastro-esophageal reflux disease without esophagitis: Secondary | ICD-10-CM | POA: Insufficient documentation

## 2019-01-17 DIAGNOSIS — G8929 Other chronic pain: Secondary | ICD-10-CM | POA: Insufficient documentation

## 2019-01-17 DIAGNOSIS — Z79899 Other long term (current) drug therapy: Secondary | ICD-10-CM | POA: Insufficient documentation

## 2019-01-17 DIAGNOSIS — F411 Generalized anxiety disorder: Secondary | ICD-10-CM | POA: Diagnosis not present

## 2019-01-17 DIAGNOSIS — Z8673 Personal history of transient ischemic attack (TIA), and cerebral infarction without residual deficits: Secondary | ICD-10-CM | POA: Diagnosis not present

## 2019-01-17 DIAGNOSIS — E559 Vitamin D deficiency, unspecified: Secondary | ICD-10-CM | POA: Insufficient documentation

## 2019-01-17 DIAGNOSIS — E039 Hypothyroidism, unspecified: Secondary | ICD-10-CM | POA: Diagnosis not present

## 2019-01-17 DIAGNOSIS — S22079A Unspecified fracture of T9-T10 vertebra, initial encounter for closed fracture: Secondary | ICD-10-CM | POA: Diagnosis present

## 2019-01-17 DIAGNOSIS — Z7951 Long term (current) use of inhaled steroids: Secondary | ICD-10-CM | POA: Diagnosis not present

## 2019-01-17 DIAGNOSIS — W01198A Fall on same level from slipping, tripping and stumbling with subsequent striking against other object, initial encounter: Secondary | ICD-10-CM | POA: Insufficient documentation

## 2019-01-17 DIAGNOSIS — I10 Essential (primary) hypertension: Secondary | ICD-10-CM | POA: Diagnosis not present

## 2019-01-17 DIAGNOSIS — Z87891 Personal history of nicotine dependence: Secondary | ICD-10-CM | POA: Diagnosis not present

## 2019-01-17 DIAGNOSIS — I700 Atherosclerosis of aorta: Secondary | ICD-10-CM | POA: Insufficient documentation

## 2019-01-17 DIAGNOSIS — J439 Emphysema, unspecified: Secondary | ICD-10-CM | POA: Insufficient documentation

## 2019-01-17 DIAGNOSIS — Z981 Arthrodesis status: Secondary | ICD-10-CM | POA: Diagnosis not present

## 2019-01-17 DIAGNOSIS — M4804 Spinal stenosis, thoracic region: Secondary | ICD-10-CM | POA: Diagnosis not present

## 2019-01-17 DIAGNOSIS — S22081A Stable burst fracture of T11-T12 vertebra, initial encounter for closed fracture: Principal | ICD-10-CM

## 2019-01-17 DIAGNOSIS — Z20828 Contact with and (suspected) exposure to other viral communicable diseases: Secondary | ICD-10-CM | POA: Diagnosis not present

## 2019-01-17 DIAGNOSIS — W19XXXA Unspecified fall, initial encounter: Secondary | ICD-10-CM | POA: Diagnosis not present

## 2019-01-17 DIAGNOSIS — E7849 Other hyperlipidemia: Secondary | ICD-10-CM

## 2019-01-17 LAB — COMPREHENSIVE METABOLIC PANEL
ALT: 18 U/L (ref 0–44)
AST: 20 U/L (ref 15–41)
Albumin: 3.5 g/dL (ref 3.5–5.0)
Alkaline Phosphatase: 34 U/L — ABNORMAL LOW (ref 38–126)
Anion gap: 8 (ref 5–15)
BUN: 14 mg/dL (ref 8–23)
CO2: 24 mmol/L (ref 22–32)
Calcium: 8.2 mg/dL — ABNORMAL LOW (ref 8.9–10.3)
Chloride: 108 mmol/L (ref 98–111)
Creatinine, Ser: 0.65 mg/dL (ref 0.44–1.00)
GFR calc Af Amer: >60 mL/min (ref >60)
GFR calc non Af Amer: >60 mL/min (ref >60)
Glucose, Bld: 95 mg/dL (ref 70–99)
Potassium: 4.3 mmol/L (ref 3.5–5.1)
Sodium: 140 mmol/L (ref 135–145)
Total Bilirubin: 0.8 mg/dL (ref 0.3–1.2)
Total Protein: 6.3 g/dL — ABNORMAL LOW (ref 6.5–8.1)

## 2019-01-17 LAB — CBC WITH DIFFERENTIAL/PLATELET
Abs Immature Granulocytes: 0.03 10*3/uL (ref 0.00–0.07)
Basophils Absolute: 0 10*3/uL (ref 0.0–0.1)
Basophils Relative: 1 %
Eosinophils Absolute: 0 10*3/uL (ref 0.0–0.5)
Eosinophils Relative: 1 %
HCT: 34.2 % — ABNORMAL LOW (ref 36.0–46.0)
Hemoglobin: 11.2 g/dL — ABNORMAL LOW (ref 12.0–15.0)
Immature Granulocytes: 0 %
Lymphocytes Relative: 12 %
Lymphs Abs: 0.8 10*3/uL (ref 0.7–4.0)
MCH: 31.5 pg (ref 26.0–34.0)
MCHC: 32.7 g/dL (ref 30.0–36.0)
MCV: 96.1 fL (ref 80.0–100.0)
Monocytes Absolute: 0.5 10*3/uL (ref 0.1–1.0)
Monocytes Relative: 7 %
Neutro Abs: 5.7 10*3/uL (ref 1.7–7.7)
Neutrophils Relative %: 79 %
Platelets: 180 10*3/uL (ref 150–400)
RBC: 3.56 MIL/uL — ABNORMAL LOW (ref 3.87–5.11)
RDW: 13.9 % (ref 11.5–15.5)
WBC: 7.2 10*3/uL (ref 4.0–10.5)
nRBC: 0 % (ref 0.0–0.2)

## 2019-01-17 LAB — URINALYSIS, COMPLETE (UACMP) WITH MICROSCOPIC
Bilirubin Urine: NEGATIVE
Glucose, UA: NEGATIVE mg/dL
Hgb urine dipstick: NEGATIVE
Ketones, ur: 5 mg/dL — AB
Leukocytes,Ua: NEGATIVE
Nitrite: NEGATIVE
Protein, ur: NEGATIVE mg/dL
Specific Gravity, Urine: 1.014 (ref 1.005–1.030)
pH: 5 (ref 5.0–8.0)

## 2019-01-17 LAB — SARS CORONAVIRUS 2 (TAT 6-24 HRS): SARS Coronavirus 2: NEGATIVE

## 2019-01-17 MED ORDER — CITALOPRAM HYDROBROMIDE 20 MG PO TABS
30.0000 mg | ORAL_TABLET | Freq: Every day | ORAL | Status: DC
  Administered 2019-01-18 – 2019-01-19 (×2): 30 mg via ORAL
  Filled 2019-01-17 (×2): qty 2

## 2019-01-17 MED ORDER — DICLOFENAC SODIUM 75 MG PO TBEC
75.0000 mg | DELAYED_RELEASE_TABLET | Freq: Two times a day (BID) | ORAL | Status: DC
  Administered 2019-01-17 – 2019-01-19 (×4): 75 mg via ORAL
  Filled 2019-01-17 (×5): qty 1

## 2019-01-17 MED ORDER — KETOROLAC TROMETHAMINE 30 MG/ML IJ SOLN
15.0000 mg | Freq: Once | INTRAMUSCULAR | Status: AC
  Administered 2019-01-17: 15 mg via INTRAVENOUS
  Filled 2019-01-17: qty 1

## 2019-01-17 MED ORDER — TAMOXIFEN CITRATE 10 MG PO TABS
20.0000 mg | ORAL_TABLET | Freq: Every day | ORAL | Status: DC
  Administered 2019-01-17 – 2019-01-19 (×3): 20 mg via ORAL
  Filled 2019-01-17 (×3): qty 2

## 2019-01-17 MED ORDER — ONDANSETRON HCL 4 MG/2ML IJ SOLN: 4.0000 mg | Freq: Four times a day (QID) | INTRAMUSCULAR | Status: DC | PRN

## 2019-01-17 MED ORDER — SODIUM CHLORIDE 0.9 % IV BOLUS
500.0000 mL | Freq: Once | INTRAVENOUS | Status: AC
  Administered 2019-01-17: 500 mL via INTRAVENOUS

## 2019-01-17 MED ORDER — FLUTICASONE FUROATE-VILANTEROL 100-25 MCG/INH IN AEPB: 1.0000 | INHALATION_SPRAY | Freq: Every day | RESPIRATORY_TRACT | Status: DC

## 2019-01-17 MED ORDER — MORPHINE SULFATE (PF) 2 MG/ML IV SOLN
2.0000 mg | Freq: Once | INTRAVENOUS | Status: AC
  Administered 2019-01-17: 2 mg via INTRAVENOUS
  Filled 2019-01-17: qty 1

## 2019-01-17 MED ORDER — VITAMIN D 25 MCG (1000 UNIT) PO TABS
1000.0000 [IU] | ORAL_TABLET | Freq: Every day | ORAL | Status: DC
  Administered 2019-01-18 – 2019-01-19 (×2): 1000 [IU] via ORAL
  Filled 2019-01-17 (×2): qty 1

## 2019-01-17 MED ORDER — ATORVASTATIN CALCIUM 20 MG PO TABS
40.0000 mg | ORAL_TABLET | Freq: Every day | ORAL | Status: DC
  Administered 2019-01-17 – 2019-01-18 (×2): 40 mg via ORAL
  Filled 2019-01-17 (×2): qty 2

## 2019-01-17 MED ORDER — ACETAMINOPHEN 650 MG RE SUPP: 650.0000 mg | Freq: Four times a day (QID) | RECTAL | Status: DC | PRN

## 2019-01-17 MED ORDER — TRAMADOL HCL 50 MG PO TABS
50.0000 mg | ORAL_TABLET | Freq: Four times a day (QID) | ORAL | Status: DC | PRN
  Administered 2019-01-17 – 2019-01-19 (×5): 50 mg via ORAL
  Filled 2019-01-17 (×5): qty 1

## 2019-01-17 MED ORDER — FENTANYL CITRATE (PF) 100 MCG/2ML IJ SOLN
25.0000 ug | Freq: Once | INTRAMUSCULAR | Status: DC
  Filled 2019-01-17: qty 2

## 2019-01-17 MED ORDER — OXYCODONE HCL 5 MG PO TABS
5.0000 mg | ORAL_TABLET | Freq: Once | ORAL | Status: AC
  Administered 2019-01-17: 5 mg via ORAL
  Filled 2019-01-17: qty 1

## 2019-01-17 MED ORDER — ENOXAPARIN SODIUM 40 MG/0.4ML ~~LOC~~ SOLN
40.0000 mg | SUBCUTANEOUS | Status: DC
  Filled 2019-01-17 (×2): qty 0.4

## 2019-01-17 MED ORDER — MIRTAZAPINE 15 MG PO TABS
15.0000 mg | ORAL_TABLET | Freq: Every day | ORAL | Status: DC
  Administered 2019-01-17 – 2019-01-18 (×2): 15 mg via ORAL
  Filled 2019-01-17 (×2): qty 1

## 2019-01-17 MED ORDER — ACETAMINOPHEN 325 MG PO TABS
650.0000 mg | ORAL_TABLET | Freq: Four times a day (QID) | ORAL | Status: DC | PRN
  Administered 2019-01-18 – 2019-01-19 (×3): 650 mg via ORAL
  Filled 2019-01-17 (×3): qty 2

## 2019-01-17 MED ORDER — ONDANSETRON HCL 4 MG PO TABS: 4.0000 mg | ORAL_TABLET | Freq: Four times a day (QID) | ORAL | Status: DC | PRN

## 2019-01-17 MED ORDER — POLYETHYLENE GLYCOL 3350 17 G PO PACK
17.0000 g | PACK | Freq: Every day | ORAL | Status: DC | PRN
  Administered 2019-01-18: 17 g via ORAL
  Filled 2019-01-17: qty 1

## 2019-01-17 MED ORDER — ALBUTEROL SULFATE HFA 108 (90 BASE) MCG/ACT IN AERS: 2.0000 | INHALATION_SPRAY | Freq: Four times a day (QID) | RESPIRATORY_TRACT | Status: DC | PRN

## 2019-01-17 MED ORDER — ALBUTEROL SULFATE (2.5 MG/3ML) 0.083% IN NEBU: 2.5000 mg | INHALATION_SOLUTION | Freq: Four times a day (QID) | RESPIRATORY_TRACT | Status: DC | PRN

## 2019-01-17 MED ORDER — ACETAMINOPHEN 325 MG PO TABS
650.0000 mg | ORAL_TABLET | Freq: Once | ORAL | Status: AC
  Administered 2019-01-17: 650 mg via ORAL
  Filled 2019-01-17: qty 2

## 2019-01-17 MED ORDER — LEVOTHYROXINE SODIUM 50 MCG PO TABS
50.0000 ug | ORAL_TABLET | Freq: Every day | ORAL | Status: DC
  Administered 2019-01-18 – 2019-01-19 (×2): 50 ug via ORAL
  Filled 2019-01-17 (×2): qty 1

## 2019-01-17 MED ORDER — CALCIUM CARBONATE 1250 (500 CA) MG PO TABS
1250.0000 mg | ORAL_TABLET | Freq: Two times a day (BID) | ORAL | Status: DC
  Administered 2019-01-17 – 2019-01-19 (×4): 1250 mg via ORAL
  Filled 2019-01-17 (×4): qty 1

## 2019-01-17 NOTE — ED Provider Notes (Signed)
[facility] Emergency Department Provider Note  ____________________________________________   First MD Initiated Contact with Patient 01/17/19 1011     (approximate)  I have reviewed the triage vital signs and the nursing notes.   HISTORY  Chief Complaint Fall    HPI Julie Mckinney is a 84 y.o. female  With h/o COPD, breast CA, here with pain after fall. Pt states that 2 days ago, she was walking when she tried to switch directions too quickly. She fell, hitting her head and L shoulder on a door frame then fell onto her back. She reports she had some moderate pain at the time. She was unable to get herself back up so called for her husband who called EMS. She had some mild headache, neck pain, L side and lower back pain yesterday but this was manageable. Today, she reports her pain has acutely worsened and is 10/10, severe, worse w/ any movement. It is worse in the L flank and lower back. No UE or LE weakness or numbness. She had difficulty getting around her house today due to this. Denies recent fever, chills, or illness. No recent med changes. She states she has fallen before.       Past Medical History:  Diagnosis Date  . Anxiety   . Asthma   . Breast cancer ([place]) Right  . COPD (chronic obstructive pulmonary disease) ([place])   . Depression   . Personal history of radiation therapy     Patient Active Problem List   Diagnosis Date Noted  . MDD (major depressive disorder), recurrent, in partial remission ([place]) 01/06/2019  . H/O macrocytic anemia 12/08/2018  . DNR (do not resuscitate) 12/08/2018  . MDD (major depressive disorder), recurrent episode, mild ([place]) 09/22/2018  . GAD (generalized anxiety disorder) 09/22/2018  . Insomnia due to medical condition 09/22/2018  . Medicare annual wellness visit, initial 08/04/2018  . H/O orthostatic hypotension 02/05/2018  . Essential hypertension 02/05/2018  . Acute blood loss anemia 05/13/2017  .  Postoperative CSF leak 05/13/2017  . History of CVA (cerebrovascular accident) without residual deficits 05/12/2017  . Anemia 05/02/2017  . Vitamin B 12 deficiency 10/28/2016  . Diarrhea of infectious origin 09/12/2016  . Acquired hypothyroidism 07/02/2016  . Chronic obstructive pulmonary disease ([place]) 07/02/2016  . Depression, major, single episode, complete remission ([place]) 07/02/2016  . Gastroesophageal reflux disease without esophagitis 07/02/2016  . Lumbar radiculopathy 07/02/2016  . Osteopenia of multiple sites 07/02/2016  . Pure hypercholesterolemia 07/02/2016  . History of diarrhea 06/10/2016  . Weight loss, abnormal 06/10/2016  . Abnormal CT scan, colon 06/10/2016  . Breast cancer of upper-outer quadrant of right female breast ([place]) 11/02/2013  . History of right breast cancer 11/02/2013    Past Surgical History:  Procedure Laterality Date  . ABDOMINAL HYSTERECTOMY    . APPENDECTOMY    . BACK SURGERY    . BREAST BIOPSY Right 2015   +  . BREAST EXCISIONAL BIOPSY Right 1975   neg  . BREAST LUMPECTOMY  2015  . CHOLECYSTECTOMY    . COLONOSCOPY     Scheduled for July 2018  . COLONOSCOPY WITH PROPOFOL N/A 08/14/2016   Procedure: COLONOSCOPY WITH PROPOFOL;  Surgeon: Manya Silvas, MD;  Location: [facility] ENDOSCOPY;  Service: Endoscopy;  Laterality: N/A;  . ESOPHAGOGASTRODUODENOSCOPY (EGD) WITH PROPOFOL N/A 08/14/2016   Procedure: ESOPHAGOGASTRODUODENOSCOPY (EGD) WITH PROPOFOL;  Surgeon: Manya Silvas, MD;  Location: [facility] ENDOSCOPY;  Service: Endoscopy;  Laterality: N/A;    Prior to Admission medications  Medication Sig Start Date End Date Taking? Authorizing Provider  albuterol (PROVENTIL HFA;VENTOLIN HFA) 108 (90 Base) MCG/ACT inhaler Inhale 2 puffs into the lungs every 6 (six) hours as needed.    [provider]  amLODipine (NORVASC) 2.5 MG tablet Take 2.5 mg by mouth daily.    [provider]  atorvastatin (LIPITOR) 40 MG tablet Take by mouth.  08/04/18 08/04/19  [provider]  Azelastine-Fluticasone 137-50 MCG/ACT SUSP Place 1 spray into the nose daily.  01/27/18   [provider]  calcium carbonate (CALCIUM 600) 600 MG TABS tablet Take 600 mg by mouth 2 (two) times daily.    [provider]  Cholecalciferol (VITAMIN D3) 1000 units CAPS Take 1,000 mg by mouth daily.    [provider]  citalopram (CELEXA) 20 MG tablet Take 1.5 tablets (30 mg total) by mouth daily. 09/22/18   Ursula Alert, MD  clobetasol cream (TEMOVATE) 0.05 %  09/26/17   [provider]  clotrimazole-betamethasone (LOTRISONE) cream Apply 1 application topically 2 (two) times daily.    [provider]  diclofenac (VOLTAREN) 75 MG EC tablet Take 75 mg by mouth 2 (two) times daily.    [provider]  fluconazole (DIFLUCAN) 150 MG tablet Take 150 mg by mouth once.  09/10/17   [provider]  fluticasone (FLONASE) 50 MCG/ACT nasal spray Place 1 spray into the nose as needed.    [provider]  Fluticasone Furoate-Vilanterol (BREO ELLIPTA IN) Inhale 1 puff into the lungs daily.    [provider]  GLUCOSAMINE SULFATE PO Take 1 capsule by mouth 2 (two) times daily.    [provider]  hydrOXYzine (ATARAX/VISTARIL) 25 MG tablet Take 1 tablet (25 mg total) by mouth daily as needed for anxiety. 09/22/18   Ursula Alert, MD  levothyroxine (SYNTHROID, LEVOTHROID) 50 MCG tablet Take 50 mcg by mouth daily.    [provider]  Melatonin 5 MG TABS Take by mouth.    [provider]  mirabegron ER (MYRBETRIQ) 25 MG TB24 tablet Take by mouth. 08/19/18   [provider]  mirabegron ER (MYRBETRIQ) 50 MG TB24 tablet Take by mouth. 09/24/18   [provider]  mirtazapine (REMERON) 15 MG tablet Take 1 tablet (15 mg total) by mouth at bedtime. For sleep and mood 09/22/18   Ursula Alert, MD  Multiple Vitamin (MULTI-VITAMINS) TABS Take 1 tablet by mouth daily.     [provider]  omeprazole (PRILOSEC) 20 MG capsule Take 20 mg by mouth daily.    [provider]  Potassium 99 MG TABS Take 1 tablet by mouth daily.     [provider]  simvastatin (ZOCOR) 80 MG tablet Take 80 mg by mouth daily.    [provider]  tamoxifen (NOLVADEX) 20 MG tablet Take 1 tablet (20 mg total) by mouth daily. 09/10/18   Sindy Guadeloupe, MD  terconazole (TERAZOL 3) 80 MG vaginal suppository Place vaginally. 09/16/18   [provider]  tiotropium (SPIRIVA) 18 MCG inhalation capsule Place 1 capsule into inhaler and inhale as needed.    [provider]  traMADol (ULTRAM) 50 MG tablet Take 50 mg by mouth every 6 (six) hours as needed.    [provider]  vitamin B-12 (CYANOCOBALAMIN) 1000 MCG tablet Take 1,000 mcg by mouth daily.    [provider]    Allergies Anastrozole, Letrozole, and Tape  Family History  Problem Relation Age of Onset  . Cancer Father   .  Breast cancer Neg Hx     Social History Social History   Tobacco Use  . Smoking status: Former Smoker    Packs/day: 0.50    Years: 30.00    Pack years: 15.00    Types: Cigarettes    Quit date: 06/22/1995    Years since quitting: 23.5  . Smokeless tobacco: Never Used  Substance Use Topics  . Alcohol use: Yes    Alcohol/week: 1.0 standard drinks    Types: 1 Glasses of wine per week    Comment: Occasional  . Drug use: No    Review of Systems  Review of Systems  Constitutional: Positive for fatigue. Negative for fever.  HENT: Negative for congestion and sore throat.   Eyes: Negative for visual disturbance.  Respiratory: Negative for cough and shortness of breath.   Cardiovascular: Negative for chest pain.  Gastrointestinal: Negative for abdominal pain, diarrhea, nausea and vomiting.  Genitourinary: Negative for flank pain.  Musculoskeletal: Positive for arthralgias, back pain, gait problem and neck pain.  Skin: Negative for rash and  wound.  Neurological: Positive for headaches. Negative for weakness.  All other systems reviewed and are negative.    ____________________________________________  PHYSICAL EXAM:      VITAL SIGNS: ED Triage Vitals  Enc Vitals Group     BP 01/17/19 1016 (!) 154/72     Pulse Rate 01/17/19 1016 63     Resp 01/17/19 1016 16     Temp 01/17/19 1016 98.8 F (37.1 C)     Temp Source 01/17/19 1016 Oral     SpO2 01/17/19 1016 93 %     Weight 01/17/19 1017 130 lb (59 kg)     Height 01/17/19 1017 5' (1.524 m)     Head Circumference --      Peak Flow --      Pain Score 01/17/19 1017 9     Pain Loc --      Pain Edu? --      Excl. in [place]? --      Physical Exam Vitals and nursing note reviewed.  Constitutional:      General: She is not in acute distress.    Appearance: She is well-developed.  HENT:     Head: Normocephalic and atraumatic.     Comments: Moderate tenderness to palpation over the left parietal scalp.  No hemotympanum.  No periorbital ecchymoses. Eyes:     Conjunctiva/sclera: Conjunctivae normal.  Neck:     Comments: Moderate tenderness to palpation of the left paraspinal and lower midline cervical spine. Cardiovascular:     Rate and Rhythm: Normal rate and regular rhythm.     Heart sounds: Normal heart sounds. No murmur. No friction rub.     Comments: Significant tenderness to palpation over the left lateral chest wall.  Mild tachypnea. Pulmonary:     Effort: Pulmonary effort is normal. No respiratory distress.     Breath sounds: Normal breath sounds. No wheezing or rales.  Abdominal:     General: There is no distension.     Palpations: Abdomen is soft.     Tenderness: There is no abdominal tenderness.  Musculoskeletal:     Cervical back: Neck supple.     Comments: Significant midline tenderness and left paraspinal tenderness of the lumbar spine.  No obvious deformity or step-offs.  Skin:    General: Skin is warm.     Capillary Refill: Capillary refill takes less  than 2 seconds.  Neurological:     Mental Status:  She is alert and oriented to person, place, and time.     Motor: No abnormal muscle tone.     Comments: Strength out of 5 bilateral upper and lower extremities.  Normal sensation to light touch.  Cranial nerves intact.  Patient is alert and oriented.       ____________________________________________   LABS (all labs ordered are listed, but only abnormal results are displayed)  Labs Reviewed  CBC WITH DIFFERENTIAL/PLATELET - Abnormal; Notable for the following components:      Result Value   RBC 3.56 (*)    Hemoglobin 11.2 (*)    HCT 34.2 (*)    All other components within normal limits  COMPREHENSIVE METABOLIC PANEL - Abnormal; Notable for the following components:   Calcium 8.2 (*)    Total Protein 6.3 (*)    Alkaline Phosphatase 34 (*)    All other components within normal limits  URINALYSIS, COMPLETE (UACMP) WITH MICROSCOPIC    ____________________________________________  EKG: None ________________________________________  RADIOLOGY All imaging, including plain films, CT scans, and ultrasounds, independently reviewed by me, and interpretations confirmed via formal radiology reads.  ED MD interpretation:   CT Head: Neg CT C Spine: Neg for acute fx/malalignment CT T Spine: T12 burst fx, 65% height loss, 71mm retropulsion CT L Spine: Negative CT Chest: No rib fx, no PNA  Official radiology report(s): DG Chest 2 View  Result Date: 01/17/2019 CLINICAL DATA:  Fall. EXAM: CHEST - 2 VIEW COMPARISON:  None. FINDINGS: The heart size and mediastinal contours are within normal limits. There is no evidence of pulmonary edema, consolidation, pneumothorax, nodule or pleural fluid. The visualized skeletal structures are unremarkable. IMPRESSION: No active cardiopulmonary disease. Electronically Signed   By: Aletta Edouard M.D.   On: 01/17/2019 11:34   CT Head Wo Contrast  Result Date: 01/17/2019 CLINICAL DATA:  Patient fell 2  days ago. Head injury during the fall. EXAM: CT HEAD WITHOUT CONTRAST CT CERVICAL SPINE WITHOUT CONTRAST TECHNIQUE: Multidetector CT imaging of the head and cervical spine was performed following the standard protocol without intravenous contrast. Multiplanar CT image reconstructions of the cervical spine were also generated. COMPARISON:  None. FINDINGS: CT HEAD FINDINGS Brain: There is no evidence for acute hemorrhage, hydrocephalus, mass lesion, or abnormal extra-axial fluid collection. No definite CT evidence for acute infarction. Old lacunar infarct noted left cerebellar hemisphere. Diffuse loss of parenchymal volume is consistent with atrophy. Patchy low attenuation in the deep hemispheric and periventricular white matter is nonspecific, but likely reflects chronic microvascular ischemic demyelination. Vascular: No hyperdense vessel or unexpected calcification. Skull: No evidence for fracture. No worrisome lytic or sclerotic lesion. Sinuses/Orbits: The visualized paranasal sinuses and mastoid air cells are clear. Visualized portions of the globes and intraorbital fat are unremarkable. Other: None. CT CERVICAL SPINE FINDINGS Alignment: Mild reversal of normal cervical lordosis. There is 3 mm anterolisthesis of C3 on 4 and 2 mm anterolisthesis of C4 on 5, likely related to the associated loss of facet space. Skull base and vertebrae: No acute fracture. No primary bone lesion or focal pathologic process. Soft tissues and spinal canal: No prevertebral fluid or swelling. No visible canal hematoma. Disc levels: Loss of disc height with endplate degeneration noted C5-6 and C6-7. Upper chest: 6 mm pulmonary nodule incompletely visualized in the medial left upper lobe. Other: None. IMPRESSION: 1. No acute intracranial abnormality. Atrophy with chronic small vessel white matter ischemic disease. 2. No cervical spine fracture. 3. Mild reversal of normal cervical lordosis with mild anterolisthesis of  C3 on 4 and C4 on 5.  This is probably related to the prominent loss of facet space at the same levels, but ligamentous injury could have this appearance. Cervical spine MRI could be used to further evaluate as clinically warranted. 4. 6 mm nodule in the medial left upper lobe has been incompletely visualized. Follow-up CT chest without contrast recommended to further assess and establish baseline follow-up recommendations. Electronically Signed   By: Misty Stanley M.D.   On: 01/17/2019 11:45   CT Chest Wo Contrast  Result Date: 01/17/2019 CLINICAL DATA:  Severe low back and left hip pain since falling 2 days ago. T10 fracture partially imaged on earlier lumbar spine CT. Chest pain or shortness of breath, pleurisy or effusion suspected. EXAM: CT CHEST WITHOUT CONTRAST TECHNIQUE: Multidetector CT imaging of the chest was performed following the standard protocol without IV contrast. COMPARISON:  CTs of the thoracic and lumbar spine same date. FINDINGS: Cardiovascular: Minimal atherosclerosis of the aorta and great vessels. No significant involvement of the coronary arteries. No acute vascular findings on noncontrast imaging. The heart size is normal. There is no pericardial effusion. Mediastinum/Nodes: There are no enlarged mediastinal, hilar or axillary lymph nodes.There are surgical clips in the right axilla and probable postsurgical changes laterally in the right breast. There is a moderate size hiatal hernia. The trachea and thyroid gland appear unremarkable. Lungs/Pleura: Trace right-greater-than-left pleural effusions. No pneumothorax. There is emphysema with central airway thickening and mild lower lobe and right middle lobe bronchiectasis. There are scattered small pulmonary nodules, including clustered peribronchovascular nodules in the right upper, middle and lower lobes. Non clustered nodules include a 3 mm right upper lobe nodule on image 42/3 and a subpleural 6 x 3 mm left upper lobe nodule on image 37/3. Upper abdomen:  Mild extrahepatic biliary dilatation post cholecystectomy. There is a large cyst involving the mid right kidney, incompletely visualized. No acute findings. Musculoskeletal/Chest wall: T10 burst fracture with mild osseous retropulsion as described on separate thoracic spine CT report. Mild surrounding soft tissue swelling without focal hematoma. No other acute osseous findings are seen. There are old rib fractures bilaterally. IMPRESSION: 1. T10 burst fracture with mild osseous retropulsion as described on separate thoracic spine CT report. 2. Trace right-greater-than-left pleural effusions. 3. Scattered small pulmonary nodules bilaterally, nonspecific, although likely inflammatory/postinflammatory. Some of these are clustered. This appearance is almost certainly benign, and no dedicated follow-up is required if this patient is low risk for bronchogenic carcinoma (and has no known or suspected primary neoplasm). Non-contrast chest CT can be considered in 12 months if patient is high-risk. This recommendation follows the consensus statement: Guidelines for Management of Incidental Pulmonary Nodules Detected on CT Images: From the Fleischner Society 2017; Radiology 2017; 284:228-243. 4. Aortic Atherosclerosis (ICD10-I70.0) and Emphysema (ICD10-J43.9). Electronically Signed   By: Richardean Sale M.D.   On: 01/17/2019 13:28   CT Cervical Spine Wo Contrast  Result Date: 01/17/2019 CLINICAL DATA:  Patient fell 2 days ago. Head injury during the fall. EXAM: CT HEAD WITHOUT CONTRAST CT CERVICAL SPINE WITHOUT CONTRAST TECHNIQUE: Multidetector CT imaging of the head and cervical spine was performed following the standard protocol without intravenous contrast. Multiplanar CT image reconstructions of the cervical spine were also generated. COMPARISON:  None. FINDINGS: CT HEAD FINDINGS Brain: There is no evidence for acute hemorrhage, hydrocephalus, mass lesion, or abnormal extra-axial fluid collection. No definite CT  evidence for acute infarction. Old lacunar infarct noted left cerebellar hemisphere. Diffuse loss of parenchymal volume is consistent  with atrophy. Patchy low attenuation in the deep hemispheric and periventricular white matter is nonspecific, but likely reflects chronic microvascular ischemic demyelination. Vascular: No hyperdense vessel or unexpected calcification. Skull: No evidence for fracture. No worrisome lytic or sclerotic lesion. Sinuses/Orbits: The visualized paranasal sinuses and mastoid air cells are clear. Visualized portions of the globes and intraorbital fat are unremarkable. Other: None. CT CERVICAL SPINE FINDINGS Alignment: Mild reversal of normal cervical lordosis. There is 3 mm anterolisthesis of C3 on 4 and 2 mm anterolisthesis of C4 on 5, likely related to the associated loss of facet space. Skull base and vertebrae: No acute fracture. No primary bone lesion or focal pathologic process. Soft tissues and spinal canal: No prevertebral fluid or swelling. No visible canal hematoma. Disc levels: Loss of disc height with endplate degeneration noted C5-6 and C6-7. Upper chest: 6 mm pulmonary nodule incompletely visualized in the medial left upper lobe. Other: None. IMPRESSION: 1. No acute intracranial abnormality. Atrophy with chronic small vessel white matter ischemic disease. 2. No cervical spine fracture. 3. Mild reversal of normal cervical lordosis with mild anterolisthesis of C3 on 4 and C4 on 5. This is probably related to the prominent loss of facet space at the same levels, but ligamentous injury could have this appearance. Cervical spine MRI could be used to further evaluate as clinically warranted. 4. 6 mm nodule in the medial left upper lobe has been incompletely visualized. Follow-up CT chest without contrast recommended to further assess and establish baseline follow-up recommendations. Electronically Signed   By: Misty Stanley M.D.   On: 01/17/2019 11:45   CT Lumbar Spine Wo  Contrast  Result Date: 01/17/2019 CLINICAL DATA:  84 year old female status post fall 2 days ago. Continued severe low back pain radiating to the left hip. EXAM: CT LUMBAR SPINE WITHOUT CONTRAST TECHNIQUE: Multidetector CT imaging of the lumbar spine was performed without intravenous contrast administration. Multiplanar CT image reconstructions were also generated. COMPARISON:  CT lumbar spine and MRI 08/28/2017. FINDINGS: Segmentation: Normal, the same numbering system used previously. Alignment: Chronically exaggerated lower lumbar lordosis with grade 1 anterolisthesis of L4 on L5 is stable since 2019. Vertebrae: Osteopenia. The T10 inferior endplate appeared normal by MRI in 2019. There is a faintly visible inferior endplate compression deformity now (series 7, image 54). The visible T10 posterior elements appear to remain intact. There is mild retropulsion of the posteroinferior endplate, with new mild T10-T11 level spinal stenosis. The T11 and T12 vertebrae appear intact. Lumbar levels appear stable and intact. Evidence of healed bilateral S1 sacral ala fractures since 2019. No definite acute sacral fracture, and the SI joints appear intact. Paraspinal and other soft tissues: Stable noncontrast visible abdominal viscera. Moderate size gastric hiatal hernia. Grossly negative costophrenic angles. Mild Aortoiliac calcified atherosclerosis. Diverticulosis of the sigmoid colon. Postoperative changes to the posterior paraspinal soft tissues with no adverse features. Disc levels: Mild for age lumbar spine degeneration above L3-L4. L3-L4: Posterior disc space loss and mild vacuum disc with suspected circumferential disc bulge. Up to moderate posterior element hypertrophy. This level is not significantly changed. L4-L5: Prior decompression and fusion. L4 pedicle screws appear intact without loosening. L4-L5 interbody implant with stable mild endplate subsidence. No convincing arthrodesis. L5-S1: Prior decompression  and fusion. Chronic loosening of the S1 screws. Unilateral left L5 pedicle screw appears intact without loosening. L5-S1 interbody implant with chronic L5 endplate subsidence. Residual posterior element hypertrophy on the left. No convincing arthrodesis. IMPRESSION: 1. Osteopenia. Partially visible and age indeterminate T10 inferior endplate  compression fracture is new since August 2019. There is mild associated retropulsion and mild new spinal stenosis at that level. If specific therapy is desired, Thoracic MRI without contrast or Nuclear Medicine Whole-body Bone Scan would best determine acuity and confirm candidacy for vertebroplasty. 2. Healed bilateral S1 sacral ala fractures since 2019 with no acute osseous abnormality identified in the lumbosacral spine. 3. Otherwise stable postoperative changes at L4-L5 and L5-S1 with chronic loosening of the S1 pedicle screws and no convincing arthrodesis. 4. Hiatal hernia.  Sigmoid colon diverticulosis. Electronically Signed   By: Genevie Ann M.D.   On: 01/17/2019 11:45   CT T-SPINE NO CHARGE  Result Date: 01/17/2019 CLINICAL DATA:  Severe low back and left hip pain since falling 2 days ago. T10 fracture partially imaged on earlier lumbar spine CT. EXAM: CT THORACIC SPINE WITHOUT CONTRAST TECHNIQUE: Multidetector CT images of the thoracic were obtained using the standard protocol without intravenous contrast. COMPARISON:  Lumbar spine CT performed earlier today. Previous lumbar CT and MRI 08/28/2017. FINDINGS: Alignment: Normal. Vertebrae: There is a burst fracture of the T10 vertebral body resulting in approximately 65% loss of vertebral body height. There is 5 mm of osseous retropulsion on sagittal image 35/4. The posterior elements appear intact, although there is peripheral displacement of the vertebral body fracture. No other acute osseous findings. The bones are demineralized with endplate degenerative changes throughout the mid to lower thoracic spine. Paraspinal  and other soft tissues: Probable mild paraspinal edema at the T10 level. No significant hematoma. Disc levels: Mild degenerative changes throughout the thoracic spine without large disc herniation. The osseous retropulsion involving the inferior endplate of [id number] on the right results in mild mass effect on the thecal sac, not expected to cause cord compression. The right foramen appears mildly narrowed at that level. IMPRESSION: 1. Follow-up CT confirms the presence of a burst fracture of the T10 vertebral body with approximately 65% loss of vertebral body height and 5 mm of osseous retropulsion. Mild mass effect on the thecal sac, not expected to cause cord compression. 2. No other acute osseous findings.  Mild thoracic spondylosis. 3. Please refer to separate chest CT report. Electronically Signed   By: Richardean Sale M.D.   On: 01/17/2019 13:29   DG Humerus Left  Result Date: 01/17/2019 CLINICAL DATA:  Fall 2 days ago.  Pain. EXAM: LEFT HUMERUS - 2+ VIEW COMPARISON:  None. FINDINGS: Two views study shows no evidence for humerus fracture. No worrisome lytic or sclerotic osseous abnormality. Bones are demineralized. AC joint osteoarthritis noted. IMPRESSION: Negative. Electronically Signed   By: Misty Stanley M.D.   On: 01/17/2019 11:32   DG HIP UNILAT WITH PELVIS 2-3 VIEWS LEFT  Result Date: 01/17/2019 CLINICAL DATA:  Fall 2 days ago.  Pain. EXAM: DG HIP (WITH OR WITHOUT PELVIS) 2-3V LEFT COMPARISON:  None. FINDINGS: Frontal pelvis shows diffuse bony demineralization. No evidence for acute fracture. Lumbosacral spinal fusion hardware noted. SI joints and symphysis pubis unremarkable. Antegrade right femoral IM nail has been incompletely visualized. AP and frog-leg lateral views obtained of the left hip show no femoral neck fracture. Intra grade femoral nail noted with a single proximal interlocking screw, crossing a healed fracture of the proximal femur. IMPRESSION: 1. Osteopenia without acute bony  abnormality. 2. Of note, the entire left femoral intramedullary nail has not been visualized on the study. Electronically Signed   By: Misty Stanley M.D.   On: 01/17/2019 11:35    ____________________________________________  PROCEDURES   Procedure(s)  performed (including Critical Care):  Procedures  ____________________________________________  INITIAL IMPRESSION / MDM / ASSESSMENT AND PLAN / ED COURSE  As part of my medical decision making, I reviewed the following data within the [place] notes reviewed and incorporated, Old chart reviewed, Notes from prior ED visits, and [place] Controlled Substance Database       *Julie Mckinney was evaluated in Emergency Department on 01/17/2019 for the symptoms described in the history of present illness. She was evaluated in the context of the global COVID-19 pandemic, which necessitated consideration that the patient might be at risk for infection with the SARS-CoV-2 virus that causes COVID-19. Institutional protocols and algorithms that pertain to the evaluation of patients at risk for COVID-19 are in a state of rapid change based on information released by regulatory bodies including the CDC and federal and state organizations. These policies and algorithms were followed during the patient's care in the ED.  Some ED evaluations and interventions may be delayed as a result of limited staffing during the pandemic.*     Medical Decision Making: 84 year old female here with severe back pain after fall.  She was essentially unable to ambulate today.  She is hemodynamically stable and neurologically intact on my exam.  Imaging shows T12 burst fracture.  She has some mild thecal flattening but no evidence of cord compression clinically.  Discussed with Dr. Vertell Limber of neurosurgery at [facility].  Will place the patient in [facility], plan to admit for pain control and PT.  If she improves and has no neurological symptoms, can follow-up as  an outpatient.  If pain persists or neurological symptoms develop, would consider transferring or reconsulting neurosurgery.  ____________________________________________  FINAL CLINICAL IMPRESSION(S) / ED DIAGNOSES  Final diagnoses:  Closed stable burst fracture of twelfth thoracic vertebra, initial encounter [facility])     MEDICATIONS GIVEN DURING THIS VISIT:  Medications  acetaminophen (TYLENOL) tablet 650 mg (650 mg Oral Given 01/17/19 1158)  oxyCODONE (Oxy IR/ROXICODONE) immediate release tablet 5 mg (5 mg Oral Given 01/17/19 1200)  morphine 2 MG/ML injection 2 mg (2 mg Intravenous Given 01/17/19 1352)  ketorolac (TORADOL) 30 MG/ML injection 15 mg (15 mg Intravenous Given 01/17/19 1521)  sodium chloride 0.9 % bolus 500 mL (500 mLs Intravenous New Bag/Given 01/17/19 1521)     ED Discharge Orders    None       Note:  This document was prepared using Dragon voice recognition software and may include unintentional dictation errors.   Duffy Bruce, MD 01/17/19 [phone]

## 2019-01-17 NOTE — ED Notes (Signed)
Patient transported to CT 

## 2019-01-17 NOTE — ED Notes (Signed)
Admitting MD at bedside.

## 2019-01-17 NOTE — ED Triage Notes (Signed)
Patient presents to the ED with severe lower back pain and left hip pain post fall on Friday am.  Patient states she was going one way, changed her mind, tried to turn and go the other way and she fell.  Patient reports hitting her head on a door from and falling backward.  Denies loss of consciousness, denies taking blood thinners.  Patient reports yesterday she was able to move and get around but this morning she is having increased difficulty moving and increased pain.  Patient has history of leg surgeries.

## 2019-01-17 NOTE — ED Notes (Signed)
Requested transport at this time.

## 2019-01-17 NOTE — ED Notes (Signed)
Called [employer] for brace  [phone]

## 2019-01-17 NOTE — ED Notes (Signed)
Attempted to call heads up to inpatient floor at this time- unit state RN is not ready but will call back.

## 2019-01-17 NOTE — ED Notes (Signed)
Report from Anna, RN

## 2019-01-17 NOTE — ED Notes (Signed)
2LNC applied 

## 2019-01-17 NOTE — H&P (Signed)
History and Physical    Julie Mckinney [id number] DOB: 19-Jul-1934 DOA: 01/17/2019  PCP: Dion Body, MD   Patient coming from: Home  I have personally briefly reviewed patient's old medical records in [place]  Chief Complaint: Back pain after fall.  HPI: Julie Mckinney is a 84 y.o. female with medical history significant of COPD, hypertension and arthritis came to ED with complaint of worsening back pain after having a mechanical fall on Christmas morning.  According to patient she certainly changed her mind and her and rapidly resulted in tripping and falling.  Denies any dizziness or lightheadedness.  Denies any chest pain, shortness of breath or palpitations.  Denies any exertional dyspnea.  She never lost consciousness.  Patient had a chronic lower back pain but now she is having pain in her mid back, nonradiating, aggravating with any movement.  She rested at home for couple of days to see if there is any improvement but pain continued to get worse now interfering with her sleep.  Nuys any radiation of pain, tingling or numbness or focal weakness.  No urinary or fecal incontinence.  No saddle anesthesia.   She denies any nausea or vomiting or diarrhea.  No fever or chills.  No sick contacts.  No urinary symptoms.   Patient would like to try conservative management first.  ED Course: On presentation to ED she was hemodynamically stable, UA with few bacteria no leukocytes or RBCs.  CBC without any leukocytosis, COVID-19 pending.Pan CT scan with burst fracture of T10.  Mild mass-effect on thecal sac.  Review of Systems: As per HPI otherwise 10 point review of systems negative.   Past Medical History:  Diagnosis Date  . Anxiety   . Asthma   . Breast cancer ([place]) Right  . COPD (chronic obstructive pulmonary disease) ([place])   . Depression   . Personal history of radiation therapy     Past Surgical History:  Procedure Laterality Date  . ABDOMINAL HYSTERECTOMY      . APPENDECTOMY    . BACK SURGERY    . BREAST BIOPSY Right 2015   +  . BREAST EXCISIONAL BIOPSY Right 1975   neg  . BREAST LUMPECTOMY  2015  . CHOLECYSTECTOMY    . COLONOSCOPY     Scheduled for July 2018  . COLONOSCOPY WITH PROPOFOL N/A 08/14/2016   Procedure: COLONOSCOPY WITH PROPOFOL;  Surgeon: Manya Silvas, MD;  Location: [facility] ENDOSCOPY;  Service: Endoscopy;  Laterality: N/A;  . ESOPHAGOGASTRODUODENOSCOPY (EGD) WITH PROPOFOL N/A 08/14/2016   Procedure: ESOPHAGOGASTRODUODENOSCOPY (EGD) WITH PROPOFOL;  Surgeon: Manya Silvas, MD;  Location: [facility] ENDOSCOPY;  Service: Endoscopy;  Laterality: N/A;     reports that she quit smoking about 23 years ago. Her smoking use included cigarettes. She has a 15.00 pack-year smoking history. She has never used smokeless tobacco. She reports current alcohol use of about 1.0 standard drinks of alcohol per week. She reports that she does not use drugs.  Allergies  Allergen Reactions  . Anastrozole Other (See Comments)    Leg pain  . Letrozole     Other reaction(s): Unknown Leg pain  . Tape Rash    Family History  Problem Relation Age of Onset  . Cancer Father   . Breast cancer Neg Hx     Prior to Admission medications   Medication Sig Start Date End Date Taking? Authorizing Provider  albuterol (PROVENTIL HFA;VENTOLIN HFA) 108 (90 Base) MCG/ACT inhaler Inhale 2 puffs into the lungs every 6 (six)  hours as needed.    [provider]  amLODipine (NORVASC) 2.5 MG tablet Take 2.5 mg by mouth daily.    [provider]  atorvastatin (LIPITOR) 40 MG tablet Take by mouth. 08/04/18 08/04/19  [provider]  Azelastine-Fluticasone 137-50 MCG/ACT SUSP Place 1 spray into the nose daily.  01/27/18   [provider]  calcium carbonate (CALCIUM 600) 600 MG TABS tablet Take 600 mg by mouth 2 (two) times daily.    [provider]  Cholecalciferol (VITAMIN D3) 1000 units CAPS Take 1,000 mg by mouth daily.     [provider]  citalopram (CELEXA) 20 MG tablet Take 1.5 tablets (30 mg total) by mouth daily. 09/22/18   Ursula Alert, MD  clobetasol cream (TEMOVATE) 0.05 %  09/26/17   [provider]  clotrimazole-betamethasone (LOTRISONE) cream Apply 1 application topically 2 (two) times daily.    [provider]  diclofenac (VOLTAREN) 75 MG EC tablet Take 75 mg by mouth 2 (two) times daily.    [provider]  fluconazole (DIFLUCAN) 150 MG tablet Take 150 mg by mouth once.  09/10/17   [provider]  fluticasone (FLONASE) 50 MCG/ACT nasal spray Place 1 spray into the nose as needed.    [provider]  Fluticasone Furoate-Vilanterol (BREO ELLIPTA IN) Inhale 1 puff into the lungs daily.    [provider]  GLUCOSAMINE SULFATE PO Take 1 capsule by mouth 2 (two) times daily.    [provider]  hydrOXYzine (ATARAX/VISTARIL) 25 MG tablet Take 1 tablet (25 mg total) by mouth daily as needed for anxiety. 09/22/18   Ursula Alert, MD  levothyroxine (SYNTHROID, LEVOTHROID) 50 MCG tablet Take 50 mcg by mouth daily.    [provider]  Melatonin 5 MG TABS Take by mouth.    [provider]  mirabegron ER (MYRBETRIQ) 25 MG TB24 tablet Take by mouth. 08/19/18   [provider]  mirabegron ER (MYRBETRIQ) 50 MG TB24 tablet Take by mouth. 09/24/18   [provider]  mirtazapine (REMERON) 15 MG tablet Take 1 tablet (15 mg total) by mouth at bedtime. For sleep and mood 09/22/18   Ursula Alert, MD  Multiple Vitamin (MULTI-VITAMINS) TABS Take 1 tablet by mouth daily.    [provider]  omeprazole (PRILOSEC) 20 MG capsule Take 20 mg by mouth daily.    [provider]  Potassium 99 MG TABS Take 1 tablet by mouth daily.     [provider]  simvastatin (ZOCOR) 80 MG tablet Take 80 mg by mouth daily.    [provider]  tamoxifen (NOLVADEX) 20 MG tablet Take 1 tablet (20 mg total) by mouth  daily. 09/10/18   Sindy Guadeloupe, MD  terconazole (TERAZOL 3) 80 MG vaginal suppository Place vaginally. 09/16/18   [provider]  tiotropium (SPIRIVA) 18 MCG inhalation capsule Place 1 capsule into inhaler and inhale as needed.    [provider]  traMADol (ULTRAM) 50 MG tablet Take 50 mg by mouth every 6 (six) hours as needed.    [provider]  vitamin B-12 (CYANOCOBALAMIN) 1000 MCG tablet Take 1,000 mcg by mouth daily.    [provider]    Physical Exam: Vitals:   01/17/19 1400 01/17/19 1415 01/17/19 1430 01/17/19 1500  BP: 130/74  125/66 (!) 81/70  Pulse: 68 66 66 76  Resp:      Temp:      TempSrc:      SpO2: 94% 92%  94% 100%  Weight:      Height:        General: Vital signs reviewed.  Patient is well-developed and well-nourished, in no acute distress and cooperative with exam.  Head: Normocephalic and atraumatic. Eyes: EOMI, conjunctivae normal, no scleral icterus.  ENMT: Mucous membranes are moist. Posterior pharynx clear of any exudate or lesions.Normal dentition.  Neck: Supple, trachea midline, normal ROM, no JVD, masses, thyromegaly, or carotid bruit present.  Cardiovascular: RRR, S1 normal, S2 normal, no murmurs, gallops, or rubs. Pulmonary/Chest: Clear to auscultation bilaterally, no wheezes, rales, or rhonchi. Abdominal: Soft, non-tender, non-distended, BS +, no masses, organomegaly, or guarding present.  Musculoskeletal: No joint deformities, erythema, or stiffness, ROM full and nontender. Extremities: No lower extremity edema bilaterally,  pulses symmetric and intact bilaterally. No cyanosis or clubbing. Neurological: A&O x3, Strength is normal and symmetric bilaterally, cranial nerve II-XII are grossly intact, no focal motor deficit, sensory intact to light touch bilaterally.  Skin: Warm, dry and intact. No rashes or erythema. Psychiatric: Normal mood and affect. speech and behavior is normal. Cognition and memory are normal.    Labs on Admission: I have personally reviewed following labs and imaging studies  CBC: Recent Labs  Lab 01/17/19 1150  WBC 7.2  NEUTROABS 5.7  HGB 11.2*  HCT 34.2*  MCV 96.1  PLT [id number]   Basic Metabolic Panel: Recent Labs  Lab 01/17/19 1150  NA 140  K 4.3  CL 108  CO2 24  GLUCOSE 95  BUN 14  CREATININE 0.65  CALCIUM 8.2*   GFR: Estimated Creatinine Clearance: 43.6 mL/min (by C-G formula based on SCr of 0.65 mg/dL). Liver Function Tests: Recent Labs  Lab 01/17/19 1150  AST 20  ALT 18  ALKPHOS 34*  BILITOT 0.8  PROT 6.3*  ALBUMIN 3.5   No results for input(s): LIPASE, AMYLASE in the last 168 hours. No results for input(s): AMMONIA in the last 168 hours. Coagulation Profile: No results for input(s): INR, PROTIME in the last 168 hours. Cardiac Enzymes: No results for input(s): CKTOTAL, CKMB, CKMBINDEX, TROPONINI in the last 168 hours. BNP (last 3 results) No results for input(s): PROBNP in the last 8760 hours. HbA1C: No results for input(s): HGBA1C in the last 72 hours. CBG: No results for input(s): GLUCAP in the last 168 hours. Lipid Profile: No results for input(s): CHOL, HDL, LDLCALC, TRIG, CHOLHDL, LDLDIRECT in the last 72 hours. Thyroid Function Tests: No results for input(s): TSH, T4TOTAL, FREET4, T3FREE, THYROIDAB in the last 72 hours. Anemia Panel: No results for input(s): VITAMINB12, FOLATE, FERRITIN, TIBC, IRON, RETICCTPCT in the last 72 hours. Urine analysis:    Component Value Date/Time   COLORURINE YELLOW (A) 01/17/2019 1512   APPEARANCEUR CLEAR (A) 01/17/2019 1512   LABSPEC 1.014 01/17/2019 1512   PHURINE 5.0 01/17/2019 1512   GLUCOSEU NEGATIVE 01/17/2019 1512   HGBUR NEGATIVE 01/17/2019 1512   BILIRUBINUR NEGATIVE 01/17/2019 1512   KETONESUR 5 (A) 01/17/2019 1512   PROTEINUR NEGATIVE 01/17/2019 1512   NITRITE NEGATIVE 01/17/2019 1512   LEUKOCYTESUR NEGATIVE 01/17/2019 1512    Radiological Exams on Admission: DG Chest 2  View  Result Date: 01/17/2019 CLINICAL DATA:  Fall. EXAM: CHEST - 2 VIEW COMPARISON:  None. FINDINGS: The heart size and mediastinal contours are within normal limits. There is no evidence of pulmonary edema, consolidation, pneumothorax, nodule or pleural fluid. The visualized skeletal structures are unremarkable. IMPRESSION: No active cardiopulmonary disease. Electronically Signed   By: Aletta Edouard M.D.   On: 01/17/2019  11:34   CT Head Wo Contrast  Result Date: 01/17/2019 CLINICAL DATA:  Patient fell 2 days ago. Head injury during the fall. EXAM: CT HEAD WITHOUT CONTRAST CT CERVICAL SPINE WITHOUT CONTRAST TECHNIQUE: Multidetector CT imaging of the head and cervical spine was performed following the standard protocol without intravenous contrast. Multiplanar CT image reconstructions of the cervical spine were also generated. COMPARISON:  None. FINDINGS: CT HEAD FINDINGS Brain: There is no evidence for acute hemorrhage, hydrocephalus, mass lesion, or abnormal extra-axial fluid collection. No definite CT evidence for acute infarction. Old lacunar infarct noted left cerebellar hemisphere. Diffuse loss of parenchymal volume is consistent with atrophy. Patchy low attenuation in the deep hemispheric and periventricular white matter is nonspecific, but likely reflects chronic microvascular ischemic demyelination. Vascular: No hyperdense vessel or unexpected calcification. Skull: No evidence for fracture. No worrisome lytic or sclerotic lesion. Sinuses/Orbits: The visualized paranasal sinuses and mastoid air cells are clear. Visualized portions of the globes and intraorbital fat are unremarkable. Other: None. CT CERVICAL SPINE FINDINGS Alignment: Mild reversal of normal cervical lordosis. There is 3 mm anterolisthesis of C3 on 4 and 2 mm anterolisthesis of C4 on 5, likely related to the associated loss of facet space. Skull base and vertebrae: No acute fracture. No primary bone lesion or focal pathologic  process. Soft tissues and spinal canal: No prevertebral fluid or swelling. No visible canal hematoma. Disc levels: Loss of disc height with endplate degeneration noted C5-6 and C6-7. Upper chest: 6 mm pulmonary nodule incompletely visualized in the medial left upper lobe. Other: None. IMPRESSION: 1. No acute intracranial abnormality. Atrophy with chronic small vessel white matter ischemic disease. 2. No cervical spine fracture. 3. Mild reversal of normal cervical lordosis with mild anterolisthesis of C3 on 4 and C4 on 5. This is probably related to the prominent loss of facet space at the same levels, but ligamentous injury could have this appearance. Cervical spine MRI could be used to further evaluate as clinically warranted. 4. 6 mm nodule in the medial left upper lobe has been incompletely visualized. Follow-up CT chest without contrast recommended to further assess and establish baseline follow-up recommendations. Electronically Signed   By: Misty Stanley M.D.   On: 01/17/2019 11:45   CT Chest Wo Contrast  Result Date: 01/17/2019 CLINICAL DATA:  Severe low back and left hip pain since falling 2 days ago. T10 fracture partially imaged on earlier lumbar spine CT. Chest pain or shortness of breath, pleurisy or effusion suspected. EXAM: CT CHEST WITHOUT CONTRAST TECHNIQUE: Multidetector CT imaging of the chest was performed following the standard protocol without IV contrast. COMPARISON:  CTs of the thoracic and lumbar spine same date. FINDINGS: Cardiovascular: Minimal atherosclerosis of the aorta and great vessels. No significant involvement of the coronary arteries. No acute vascular findings on noncontrast imaging. The heart size is normal. There is no pericardial effusion. Mediastinum/Nodes: There are no enlarged mediastinal, hilar or axillary lymph nodes.There are surgical clips in the right axilla and probable postsurgical changes laterally in the right breast. There is a moderate size hiatal hernia. The  trachea and thyroid gland appear unremarkable. Lungs/Pleura: Trace right-greater-than-left pleural effusions. No pneumothorax. There is emphysema with central airway thickening and mild lower lobe and right middle lobe bronchiectasis. There are scattered small pulmonary nodules, including clustered peribronchovascular nodules in the right upper, middle and lower lobes. Non clustered nodules include a 3 mm right upper lobe nodule on image 42/3 and a subpleural 6 x 3 mm left upper lobe nodule on  image 37/3. Upper abdomen: Mild extrahepatic biliary dilatation post cholecystectomy. There is a large cyst involving the mid right kidney, incompletely visualized. No acute findings. Musculoskeletal/Chest wall: T10 burst fracture with mild osseous retropulsion as described on separate thoracic spine CT report. Mild surrounding soft tissue swelling without focal hematoma. No other acute osseous findings are seen. There are old rib fractures bilaterally. IMPRESSION: 1. T10 burst fracture with mild osseous retropulsion as described on separate thoracic spine CT report. 2. Trace right-greater-than-left pleural effusions. 3. Scattered small pulmonary nodules bilaterally, nonspecific, although likely inflammatory/postinflammatory. Some of these are clustered. This appearance is almost certainly benign, and no dedicated follow-up is required if this patient is low risk for bronchogenic carcinoma (and has no known or suspected primary neoplasm). Non-contrast chest CT can be considered in 12 months if patient is high-risk. This recommendation follows the consensus statement: Guidelines for Management of Incidental Pulmonary Nodules Detected on CT Images: From the Fleischner Society 2017; Radiology 2017; 284:228-243. 4. Aortic Atherosclerosis (ICD10-I70.0) and Emphysema (ICD10-J43.9). Electronically Signed   By: Richardean Sale M.D.   On: 01/17/2019 13:28   CT Cervical Spine Wo Contrast  Result Date: 01/17/2019 CLINICAL DATA:   Patient fell 2 days ago. Head injury during the fall. EXAM: CT HEAD WITHOUT CONTRAST CT CERVICAL SPINE WITHOUT CONTRAST TECHNIQUE: Multidetector CT imaging of the head and cervical spine was performed following the standard protocol without intravenous contrast. Multiplanar CT image reconstructions of the cervical spine were also generated. COMPARISON:  None. FINDINGS: CT HEAD FINDINGS Brain: There is no evidence for acute hemorrhage, hydrocephalus, mass lesion, or abnormal extra-axial fluid collection. No definite CT evidence for acute infarction. Old lacunar infarct noted left cerebellar hemisphere. Diffuse loss of parenchymal volume is consistent with atrophy. Patchy low attenuation in the deep hemispheric and periventricular white matter is nonspecific, but likely reflects chronic microvascular ischemic demyelination. Vascular: No hyperdense vessel or unexpected calcification. Skull: No evidence for fracture. No worrisome lytic or sclerotic lesion. Sinuses/Orbits: The visualized paranasal sinuses and mastoid air cells are clear. Visualized portions of the globes and intraorbital fat are unremarkable. Other: None. CT CERVICAL SPINE FINDINGS Alignment: Mild reversal of normal cervical lordosis. There is 3 mm anterolisthesis of C3 on 4 and 2 mm anterolisthesis of C4 on 5, likely related to the associated loss of facet space. Skull base and vertebrae: No acute fracture. No primary bone lesion or focal pathologic process. Soft tissues and spinal canal: No prevertebral fluid or swelling. No visible canal hematoma. Disc levels: Loss of disc height with endplate degeneration noted C5-6 and C6-7. Upper chest: 6 mm pulmonary nodule incompletely visualized in the medial left upper lobe. Other: None. IMPRESSION: 1. No acute intracranial abnormality. Atrophy with chronic small vessel white matter ischemic disease. 2. No cervical spine fracture. 3. Mild reversal of normal cervical lordosis with mild anterolisthesis of C3 on 4  and C4 on 5. This is probably related to the prominent loss of facet space at the same levels, but ligamentous injury could have this appearance. Cervical spine MRI could be used to further evaluate as clinically warranted. 4. 6 mm nodule in the medial left upper lobe has been incompletely visualized. Follow-up CT chest without contrast recommended to further assess and establish baseline follow-up recommendations. Electronically Signed   By: Misty Stanley M.D.   On: 01/17/2019 11:45   CT Lumbar Spine Wo Contrast  Result Date: 01/17/2019 CLINICAL DATA:  84 year old female status post fall 2 days ago. Continued severe low back pain radiating to the  left hip. EXAM: CT LUMBAR SPINE WITHOUT CONTRAST TECHNIQUE: Multidetector CT imaging of the lumbar spine was performed without intravenous contrast administration. Multiplanar CT image reconstructions were also generated. COMPARISON:  CT lumbar spine and MRI 08/28/2017. FINDINGS: Segmentation: Normal, the same numbering system used previously. Alignment: Chronically exaggerated lower lumbar lordosis with grade 1 anterolisthesis of L4 on L5 is stable since 2019. Vertebrae: Osteopenia. The T10 inferior endplate appeared normal by MRI in 2019. There is a faintly visible inferior endplate compression deformity now (series 7, image 54). The visible T10 posterior elements appear to remain intact. There is mild retropulsion of the posteroinferior endplate, with new mild T10-T11 level spinal stenosis. The T11 and T12 vertebrae appear intact. Lumbar levels appear stable and intact. Evidence of healed bilateral S1 sacral ala fractures since 2019. No definite acute sacral fracture, and the SI joints appear intact. Paraspinal and other soft tissues: Stable noncontrast visible abdominal viscera. Moderate size gastric hiatal hernia. Grossly negative costophrenic angles. Mild Aortoiliac calcified atherosclerosis. Diverticulosis of the sigmoid colon. Postoperative changes to the  posterior paraspinal soft tissues with no adverse features. Disc levels: Mild for age lumbar spine degeneration above L3-L4. L3-L4: Posterior disc space loss and mild vacuum disc with suspected circumferential disc bulge. Up to moderate posterior element hypertrophy. This level is not significantly changed. L4-L5: Prior decompression and fusion. L4 pedicle screws appear intact without loosening. L4-L5 interbody implant with stable mild endplate subsidence. No convincing arthrodesis. L5-S1: Prior decompression and fusion. Chronic loosening of the S1 screws. Unilateral left L5 pedicle screw appears intact without loosening. L5-S1 interbody implant with chronic L5 endplate subsidence. Residual posterior element hypertrophy on the left. No convincing arthrodesis. IMPRESSION: 1. Osteopenia. Partially visible and age indeterminate T10 inferior endplate compression fracture is new since August 2019. There is mild associated retropulsion and mild new spinal stenosis at that level. If specific therapy is desired, Thoracic MRI without contrast or Nuclear Medicine Whole-body Bone Scan would best determine acuity and confirm candidacy for vertebroplasty. 2. Healed bilateral S1 sacral ala fractures since 2019 with no acute osseous abnormality identified in the lumbosacral spine. 3. Otherwise stable postoperative changes at L4-L5 and L5-S1 with chronic loosening of the S1 pedicle screws and no convincing arthrodesis. 4. Hiatal hernia.  Sigmoid colon diverticulosis. Electronically Signed   By: Genevie Ann M.D.   On: 01/17/2019 11:45   CT T-SPINE NO CHARGE  Result Date: 01/17/2019 CLINICAL DATA:  Severe low back and left hip pain since falling 2 days ago. T10 fracture partially imaged on earlier lumbar spine CT. EXAM: CT THORACIC SPINE WITHOUT CONTRAST TECHNIQUE: Multidetector CT images of the thoracic were obtained using the standard protocol without intravenous contrast. COMPARISON:  Lumbar spine CT performed earlier today.  Previous lumbar CT and MRI 08/28/2017. FINDINGS: Alignment: Normal. Vertebrae: There is a burst fracture of the T10 vertebral body resulting in approximately 65% loss of vertebral body height. There is 5 mm of osseous retropulsion on sagittal image 35/4. The posterior elements appear intact, although there is peripheral displacement of the vertebral body fracture. No other acute osseous findings. The bones are demineralized with endplate degenerative changes throughout the mid to lower thoracic spine. Paraspinal and other soft tissues: Probable mild paraspinal edema at the T10 level. No significant hematoma. Disc levels: Mild degenerative changes throughout the thoracic spine without large disc herniation. The osseous retropulsion involving the inferior endplate of [id number] on the right results in mild mass effect on the thecal sac, not expected to cause cord compression. The right  foramen appears mildly narrowed at that level. IMPRESSION: 1. Follow-up CT confirms the presence of a burst fracture of the T10 vertebral body with approximately 65% loss of vertebral body height and 5 mm of osseous retropulsion. Mild mass effect on the thecal sac, not expected to cause cord compression. 2. No other acute osseous findings.  Mild thoracic spondylosis. 3. Please refer to separate chest CT report. Electronically Signed   By: Richardean Sale M.D.   On: 01/17/2019 13:29   DG Humerus Left  Result Date: 01/17/2019 CLINICAL DATA:  Fall 2 days ago.  Pain. EXAM: LEFT HUMERUS - 2+ VIEW COMPARISON:  None. FINDINGS: Two views study shows no evidence for humerus fracture. No worrisome lytic or sclerotic osseous abnormality. Bones are demineralized. AC joint osteoarthritis noted. IMPRESSION: Negative. Electronically Signed   By: Misty Stanley M.D.   On: 01/17/2019 11:32   DG HIP UNILAT WITH PELVIS 2-3 VIEWS LEFT  Result Date: 01/17/2019 CLINICAL DATA:  Fall 2 days ago.  Pain. EXAM: DG HIP (WITH OR WITHOUT PELVIS) 2-3V LEFT  COMPARISON:  None. FINDINGS: Frontal pelvis shows diffuse bony demineralization. No evidence for acute fracture. Lumbosacral spinal fusion hardware noted. SI joints and symphysis pubis unremarkable. Antegrade right femoral IM nail has been incompletely visualized. AP and frog-leg lateral views obtained of the left hip show no femoral neck fracture. Intra grade femoral nail noted with a single proximal interlocking screw, crossing a healed fracture of the proximal femur. IMPRESSION: 1. Osteopenia without acute bony abnormality. 2. Of note, the entire left femoral intramedullary nail has not been visualized on the study. Electronically Signed   By: Misty Stanley M.D.   On: 01/17/2019 11:35   Assessment/Plan Active Problems:   T10 vertebral fracture (HCC)   T10 vertebral fracture.  Secondary to fall.  Discussed with Dr. Donivan Scull with orthopedic, after reviewing her images he thinks she might have an older fracture, in order to discriminate whether it is new she will need an MRI. No need for MRI at this time as we are going to try conservative management first. If she has persistent pain she might need kyphoplasty.  According to him for T10 fracture brace might not be very helpful but she can always try if it helps. We will try conservative management first. She can follow-up with spinal surgery as an outpatient. -Pain management. -PT/OT consult for home health-patient lives with her husband and then assisted living and would like to go back home.  COPD.  No acute exacerbation. -Continuing home inhalers.  Hypertension.  Patient on a low-dose amlodipine at home. Currently softer blood pressure. -Holding home amlodipine.  DVT prophylaxis: Lovenox Code Status: DNR Family Communication: Husband was updated on phone. Disposition Plan: Most likely home tomorrow. Consults called: Orthopedic Admission status: Observation   Lorella Nimrod MD [facility] Pager [phone]  If 7PM-7AM, please  contact night-coverage [email or web address] Password [facility]  01/17/2019, 4:16 PM   This record has been created using Dragon voice recognition software. Errors have been sought and corrected,but may not always be located. Such creation errors do not reflect on the standard of care.

## 2019-01-17 NOTE — ED Notes (Signed)
Report given to Estill Bamberg, RN at this time.

## 2019-01-18 DIAGNOSIS — W19XXXA Unspecified fall, initial encounter: Secondary | ICD-10-CM | POA: Diagnosis not present

## 2019-01-18 DIAGNOSIS — E7849 Other hyperlipidemia: Secondary | ICD-10-CM | POA: Diagnosis not present

## 2019-01-18 DIAGNOSIS — S22078A Other fracture of T9-T10 vertebra, initial encounter for closed fracture: Secondary | ICD-10-CM | POA: Diagnosis not present

## 2019-01-18 DIAGNOSIS — S22081A Stable burst fracture of T11-T12 vertebra, initial encounter for closed fracture: Secondary | ICD-10-CM | POA: Diagnosis not present

## 2019-01-18 LAB — CBC
HCT: 34.9 % — ABNORMAL LOW (ref 36.0–46.0)
Hemoglobin: 11.3 g/dL — ABNORMAL LOW (ref 12.0–15.0)
MCH: 31.1 pg (ref 26.0–34.0)
MCHC: 32.4 g/dL (ref 30.0–36.0)
MCV: 96.1 fL (ref 80.0–100.0)
Platelets: 192 10*3/uL (ref 150–400)
RBC: 3.63 MIL/uL — ABNORMAL LOW (ref 3.87–5.11)
RDW: 13.8 % (ref 11.5–15.5)
WBC: 6.9 10*3/uL (ref 4.0–10.5)
nRBC: 0 % (ref 0.0–0.2)

## 2019-01-18 LAB — BASIC METABOLIC PANEL
Anion gap: 9 (ref 5–15)
BUN: 18 mg/dL (ref 8–23)
CO2: 24 mmol/L (ref 22–32)
Calcium: 8.4 mg/dL — ABNORMAL LOW (ref 8.9–10.3)
Chloride: 109 mmol/L (ref 98–111)
Creatinine, Ser: 0.75 mg/dL (ref 0.44–1.00)
GFR calc Af Amer: >60 mL/min (ref >60)
GFR calc non Af Amer: >60 mL/min (ref >60)
Glucose, Bld: 75 mg/dL (ref 70–99)
Potassium: 4 mmol/L (ref 3.5–5.1)
Sodium: 142 mmol/L (ref 135–145)

## 2019-01-18 MED ORDER — MELATONIN 5 MG PO TABS
5.0000 mg | ORAL_TABLET | Freq: Every day | ORAL | Status: DC
  Administered 2019-01-18: 5 mg via ORAL
  Filled 2019-01-18 (×2): qty 1

## 2019-01-18 MED ORDER — MIRABEGRON ER 50 MG PO TB24
50.0000 mg | ORAL_TABLET | Freq: Every day | ORAL | Status: DC
  Administered 2019-01-18 – 2019-01-19 (×2): 50 mg via ORAL
  Filled 2019-01-18 (×2): qty 1

## 2019-01-18 MED ORDER — ARIPIPRAZOLE 2 MG PO TABS
2.0000 mg | ORAL_TABLET | Freq: Every day | ORAL | Status: DC
  Administered 2019-01-18 – 2019-01-19 (×2): 2 mg via ORAL
  Filled 2019-01-18 (×2): qty 1

## 2019-01-18 MED ORDER — AMLODIPINE BESYLATE 5 MG PO TABS
2.5000 mg | ORAL_TABLET | Freq: Every day | ORAL | Status: DC
  Administered 2019-01-18 – 2019-01-19 (×2): 2.5 mg via ORAL
  Filled 2019-01-18 (×2): qty 1

## 2019-01-18 MED ORDER — HYDRALAZINE HCL 25 MG PO TABS
25.0000 mg | ORAL_TABLET | Freq: Four times a day (QID) | ORAL | Status: DC | PRN
  Administered 2019-01-19: 25 mg via ORAL
  Filled 2019-01-18: qty 1

## 2019-01-18 NOTE — Evaluation (Signed)
Occupational Therapy Evaluation Patient Details Name: Julie Mckinney MRN: [id number] DOB: 04-23-1934 Today's Date: 01/18/2019    History of Present Illness Per MD H&P: Pt is an 84 y.o. female with medical history significant of COPD, hypertension and arthritis came to ED with complaint of worsening back pain after having a mechanical fall on Christmas morning.  Denies any dizziness or lightheadedness.  Denies any chest pain, shortness of breath or palpitations.  Denies any exertional dyspnea.  She never lost consciousness.  Patient had a chronic lower back pain but now she is having pain in her mid back, nonradiating, aggravating with any movement.  She rested at home for couple of days to see if there is any improvement but pain continued to get worse now interfering with her sleep.  MD assessment includes: T10 vertebral fracture (Dr. Donivan Scull with orthopedics after reviewing her images thinks she might have an older fracture), COPD, and HTN.   Clinical Impression   Pt is 84 year old female s/p mechanical fall on Christmas morning with worsening back pain since. Pt currently with pain 8/10 in low back and bilateral hips and updated nurse when she came into the room and gave her Tramadol.  Pt was independent in all ADLs prior to surgery and lives with her husband. Pt is eager to return to PLOF.  Pt currently requires min assist for LB dressing while in seated position due to pain and limited trunk flexion. Pain level was 8/10 throughout session but cooperative and motivated to regain independence in ADLs.   Reviewed rec for adapting ADLs to minimize pain and reviewed AD for LB dressing which she has at home.  Pt would benefit from further instruction in dressing techniques with assistive devices for dressing and bathing skills.   No further OT recommended after discharge.      Follow Up Recommendations  No OT follow up    Equipment Recommendations       Recommendations for Other Services        Precautions / Restrictions Precautions Precautions: Fall Precaution Comments: No orders for TLSO but per MD note: orthopedic MD did not think that the brace would help but stated pt may try it to see if it helps Restrictions Weight Bearing Restrictions: No      Mobility Bed Mobility Overal bed mobility: Needs Assistance Bed Mobility: Rolling;Sit to Sidelying;Sidelying to Sit Rolling: Supervision Sidelying to sit: Min assist     Sit to sidelying: Min assist General bed mobility comments: Log roll training provided with min A going from sidelying to/from sit and min verbal cues for sequencing  Transfers Overall transfer level: Needs assistance Equipment used: Rolling walker (2 wheeled) Transfers: Sit to/from Stand Sit to Stand: Min guard         General transfer comment: Min verbal cues to limit bending and for general sequencing    Balance Overall balance assessment: Needs assistance Sitting-balance support: Feet supported Sitting balance-Leahy Scale: Good     Standing balance support: Bilateral upper extremity supported;During functional activity Standing balance-Leahy Scale: Good                             ADL either performed or assessed with clinical judgement   ADL Overall ADL's : Needs assistance/impaired Eating/Feeding: Independent;Set up   Grooming: Wash/dry hands;Wash/dry face;Oral care;Set up;Brushing hair;Independent;Sitting   Upper Body Bathing: Independent;Set up   Lower Body Bathing: Minimal assistance;Set up;Sit to/from stand   Upper Body Dressing :  Independent;Set up   Lower Body Dressing: Minimal assistance;Set up;Sit to/from stand   Toilet Transfer: Min Art therapist Details (indicate cue type and reason): Pt was anxious about having female catheter removed but was also in 8/10 and rec using a BSC until she is able to ambulate to bathroom and min guard for transfer with mod cues and extra time.            General ADL Comments: Pt has a lumbar brace in her room which she tried during PT session and did not feel any less pain with or without it on so she prefers to not wear it which is fine per MD.  She is using RW with min guard assist and declined practicing LB dressing since herpain was 8/10 and has a reacher and sock aid at home to use if needed.     Vision Patient Visual Report: No change from baseline       Perception     Praxis      Pertinent Vitals/Pain Pain Assessment: 0-10 Pain Score: 8  Pain Location: back Pain Descriptors / Indicators: Aching;Sore Pain Intervention(s): Patient requesting pain meds-RN notified;Repositioned;Monitored during session;Utilized relaxation techniques     Hand Dominance Right   Extremity/Trunk Assessment Upper Extremity Assessment Upper Extremity Assessment: Generalized weakness   Lower Extremity Assessment Lower Extremity Assessment: Defer to PT evaluation       Communication Communication Communication: No difficulties   Cognition Arousal/Alertness: Awake/alert Behavior During Therapy: WFL for tasks assessed/performed Overall Cognitive Status: Within Functional Limits for tasks assessed                                     General Comments       Exercises Total Joint Exercises Ankle Circles/Pumps: Strengthening;Both;10 reps;15 reps Quad Sets: Strengthening;Both;10 reps;15 reps Gluteal Sets: Strengthening;Both;10 reps Heel Slides: AROM;Both;5 reps Hip ABduction/ADduction: AROM;Strengthening;Both;5 reps Long Arc Quad: Strengthening;Both;10 reps;15 reps Knee Flexion: Strengthening;Both;10 reps;15 reps Marching in Standing: AROM;Both;10 reps;Standing Other Exercises Other Exercises: HEP education for BLE APs, QS, GS, and LAQ x 10 each 5-6x/day Other Exercises: General back precaution training for decreased bending and log rolling with bed mobility   Shoulder Instructions      Home Living Family/patient expects  to be discharged to:: Private residence Living Arrangements: Spouse/significant other Available Help at Discharge: Family;Available 24 hours/day Type of Home: Independent living facility Home Access: Level entry     Home Layout: One level         Bathroom Toilet: Handicapped height     Home Equipment: Walker - 2 wheels;Cane - single point;Grab bars - tub/shower;Grab bars - toilet;Shower seat          Prior Functioning/Environment Level of Independence: Independent with assistive device(s)        Comments: Ind amb in home without AD, Mod Ind amb community distances with a SPC, Ind with ADLs, 4 falls in last 12 months; facility provides lunch daily and apt cleaning, pt pays for lady to do laundry        OT Problem List: Pain;Decreased activity tolerance      OT Treatment/Interventions: Self-care/ADL training;DME and/or AE instruction;Balance training;Patient/family education    OT Goals(Current goals can be found in the care plan section) Acute Rehab OT Goals Patient Stated Goal: to regain independence w/o pain OT Goal Formulation: With patient Time For Goal Achievement: 02/01/19 Potential to Achieve Goals: Good  OT Frequency: Min  1X/week   Barriers to D/C:            Co-evaluation              AM-PAC OT "6 Clicks" Daily Activity     Outcome Measure Help from another person eating meals?: None Help from another person taking care of personal grooming?: None Help from another person toileting, which includes using toliet, bedpan, or urinal?: A Little Help from another person bathing (including washing, rinsing, drying)?: A Little Help from another person to put on and taking off regular upper body clothing?: None Help from another person to put on and taking off regular lower body clothing?: A Little 6 Click Score: 21   End of Session Equipment Utilized During Treatment: Gait belt  Activity Tolerance: Patient tolerated treatment well Patient left: in  chair;with call bell/phone within reach;with chair alarm set  OT Visit Diagnosis: Other abnormalities of gait and mobility (R26.89);History of falling (Z91.81);Pain Pain - Right/Left: (bilateral) Pain - part of body: Hip(lower back and hips)                Time: [id number] OT Time Calculation (min): 25 min Charges:  OT General Charges $OT Visit: 1 Visit OT Evaluation $OT Eval Low Complexity: 1 Low OT Treatments $Self Care/Home Management : 8-22 mins  Chrys Racer, OTR/L, [place] [facility] [phone] 01/18/19, 1:54 PM

## 2019-01-18 NOTE — Progress Notes (Signed)
PROGRESS NOTE    Julie Mckinney  [id number] DOB: 07/15/1934 DOA: 01/17/2019 PCP: Dion Body, MD      Assessment & Plan:   Active Problems:   T10 vertebral fracture (HCC)  T10 vertebral fracture.  Secondary to fall.  Discussed with Dr. Donivan Scull with orthopedic, after reviewing her images he thinks she might have an older fracture, in order to discriminate whether it is new she will need an MRI but will try conservative management. Back brace prn. PT recs home health which was set up by CM already. Tramadol & oxycodone prn  COPD:  w/o acute exacerbation. Continue on home bronchodilators   Hypertension: will restart home dose of amlodipine. Hydralazine prn   HLD: continue on statin  Hypothyroidism: will continue on home dose of levothyroxine  Depression: severity unknown. Will continue on home dose of citalopram  Vitamin D deficiency: continue on vitamin D supplements    DVT prophylaxis: lovenox Code Status: DNR Family Communication: Disposition Plan: likely d/c w/ home health tomorrow    Consultants:   Ortho surg   Procedures: n/a   Antimicrobials: n/a   Subjective: Pt c/o back pain still   Objective: Vitals:   01/17/19 1632 01/17/19 2339 01/18/19 0734 01/18/19 1421  BP: (!) 127/54 (!) 144/57 (!) 142/60 (!) 150/73  Pulse: 68 66 70 75  Resp:  18 17 16   Temp:  98.2 F (36.8 C) 98.3 F (36.8 C) 98 F (36.7 C)  TempSrc:  Oral Oral Oral  SpO2: 96% 94% 95% 96%  Weight:      Height:        Intake/Output Summary (Last 24 hours) at 01/18/2019 1659 Last data filed at 01/18/2019 1300 Gross per 24 hour  Intake 480 ml  Output 800 ml  Net -320 ml   Filed Weights   01/17/19 1017  Weight: 59 kg    Examination:  General exam: Appears calm but uncomfortable.  Respiratory system: Clear to auscultation. Respiratory effort normal. Cardiovascular system: S1 & S2 +. No  rubs, gallops or clicks.  Gastrointestinal system: Abdomen is  nondistended, soft and nontender. . Normal bowel sounds heard. Central nervous system: Alert and oriented. Moves all 4 extremities   Psychiatry: Flat mood and affect.     Data Reviewed: I have personally reviewed following labs and imaging studies  CBC: Recent Labs  Lab 01/17/19 1150 01/18/19 0626  WBC 7.2 6.9  NEUTROABS 5.7  --   HGB 11.2* 11.3*  HCT 34.2* 34.9*  MCV 96.1 96.1  PLT 180 [id number]   Basic Metabolic Panel: Recent Labs  Lab 01/17/19 1150 01/18/19 0626  NA 140 142  K 4.3 4.0  CL 108 109  CO2 24 24  GLUCOSE 95 75  BUN 14 18  CREATININE 0.65 0.75  CALCIUM 8.2* 8.4*   GFR: Estimated Creatinine Clearance: 43.6 mL/min (by C-G formula based on SCr of 0.75 mg/dL). Liver Function Tests: Recent Labs  Lab 01/17/19 1150  AST 20  ALT 18  ALKPHOS 34*  BILITOT 0.8  PROT 6.3*  ALBUMIN 3.5   No results for input(s): LIPASE, AMYLASE in the last 168 hours. No results for input(s): AMMONIA in the last 168 hours. Coagulation Profile: No results for input(s): INR, PROTIME in the last 168 hours. Cardiac Enzymes: No results for input(s): CKTOTAL, CKMB, CKMBINDEX, TROPONINI in the last 168 hours. BNP (last 3 results) No results for input(s): PROBNP in the last 8760 hours. HbA1C: No results for input(s): HGBA1C in the last 72 hours. CBG: No results  for input(s): GLUCAP in the last 168 hours. Lipid Profile: No results for input(s): CHOL, HDL, LDLCALC, TRIG, CHOLHDL, LDLDIRECT in the last 72 hours. Thyroid Function Tests: No results for input(s): TSH, T4TOTAL, FREET4, T3FREE, THYROIDAB in the last 72 hours. Anemia Panel: No results for input(s): VITAMINB12, FOLATE, FERRITIN, TIBC, IRON, RETICCTPCT in the last 72 hours. Sepsis Labs: No results for input(s): PROCALCITON, LATICACIDVEN in the last 168 hours.  Recent Results (from the past 240 hour(s))  SARS CORONAVIRUS 2 (TAT 6-24 HRS) Nasopharyngeal Nasopharyngeal Swab     Status: None   Collection Time: 01/17/19  3:16  PM   Specimen: Nasopharyngeal Swab  Result Value Ref Range Status   SARS Coronavirus 2 NEGATIVE NEGATIVE Final    Comment: (NOTE) SARS-CoV-2 target nucleic acids are NOT DETECTED. The SARS-CoV-2 RNA is generally detectable in upper and lower respiratory specimens during the acute phase of infection. Negative results do not preclude SARS-CoV-2 infection, do not rule out co-infections with other pathogens, and should not be used as the sole basis for treatment or other patient management decisions. Negative results must be combined with clinical observations, patient history, and epidemiological information. The expected result is Negative. Fact Sheet for Patients: SugarRoll.be Fact Sheet for Healthcare Providers: https://www.woods-mathews.com/ This test is not yet approved or cleared by the [place] FDA and  has been authorized for detection and/or diagnosis of SARS-CoV-2 by FDA under an Emergency Use Authorization (EUA). This EUA will remain  in effect (meaning this test can be used) for the duration of the COVID-19 declaration under Section 56 4(b)(1) of the Act, 21 U.S.C. section 360bbb-3(b)(1), unless the authorization is terminated or revoked sooner. Performed at [facility], [place] [address], [place],  [address]          Radiology Studies: DG Chest 2 View  Result Date: 01/17/2019 CLINICAL DATA:  Fall. EXAM: CHEST - 2 VIEW COMPARISON:  None. FINDINGS: The heart size and mediastinal contours are within normal limits. There is no evidence of pulmonary edema, consolidation, pneumothorax, nodule or pleural fluid. The visualized skeletal structures are unremarkable. IMPRESSION: No active cardiopulmonary disease. Electronically Signed   By: Aletta Edouard M.D.   On: 01/17/2019 11:34   CT Head Wo Contrast  Result Date: 01/17/2019 CLINICAL DATA:  Patient fell 2 days ago. Head injury during the fall. EXAM: CT HEAD WITHOUT  CONTRAST CT CERVICAL SPINE WITHOUT CONTRAST TECHNIQUE: Multidetector CT imaging of the head and cervical spine was performed following the standard protocol without intravenous contrast. Multiplanar CT image reconstructions of the cervical spine were also generated. COMPARISON:  None. FINDINGS: CT HEAD FINDINGS Brain: There is no evidence for acute hemorrhage, hydrocephalus, mass lesion, or abnormal extra-axial fluid collection. No definite CT evidence for acute infarction. Old lacunar infarct noted left cerebellar hemisphere. Diffuse loss of parenchymal volume is consistent with atrophy. Patchy low attenuation in the deep hemispheric and periventricular white matter is nonspecific, but likely reflects chronic microvascular ischemic demyelination. Vascular: No hyperdense vessel or unexpected calcification. Skull: No evidence for fracture. No worrisome lytic or sclerotic lesion. Sinuses/Orbits: The visualized paranasal sinuses and mastoid air cells are clear. Visualized portions of the globes and intraorbital fat are unremarkable. Other: None. CT CERVICAL SPINE FINDINGS Alignment: Mild reversal of normal cervical lordosis. There is 3 mm anterolisthesis of C3 on 4 and 2 mm anterolisthesis of C4 on 5, likely related to the associated loss of facet space. Skull base and vertebrae: No acute fracture. No primary bone lesion or focal pathologic  process. Soft tissues and spinal canal: No prevertebral fluid or swelling. No visible canal hematoma. Disc levels: Loss of disc height with endplate degeneration noted C5-6 and C6-7. Upper chest: 6 mm pulmonary nodule incompletely visualized in the medial left upper lobe. Other: None. IMPRESSION: 1. No acute intracranial abnormality. Atrophy with chronic small vessel white matter ischemic disease. 2. No cervical spine fracture. 3. Mild reversal of normal cervical lordosis with mild anterolisthesis of C3 on 4 and C4 on 5. This is probably related to the prominent loss of facet space  at the same levels, but ligamentous injury could have this appearance. Cervical spine MRI could be used to further evaluate as clinically warranted. 4. 6 mm nodule in the medial left upper lobe has been incompletely visualized. Follow-up CT chest without contrast recommended to further assess and establish baseline follow-up recommendations. Electronically Signed   By: Misty Stanley M.D.   On: 01/17/2019 11:45   CT Chest Wo Contrast  Result Date: 01/17/2019 CLINICAL DATA:  Severe low back and left hip pain since falling 2 days ago. T10 fracture partially imaged on earlier lumbar spine CT. Chest pain or shortness of breath, pleurisy or effusion suspected. EXAM: CT CHEST WITHOUT CONTRAST TECHNIQUE: Multidetector CT imaging of the chest was performed following the standard protocol without IV contrast. COMPARISON:  CTs of the thoracic and lumbar spine same date. FINDINGS: Cardiovascular: Minimal atherosclerosis of the aorta and great vessels. No significant involvement of the coronary arteries. No acute vascular findings on noncontrast imaging. The heart size is normal. There is no pericardial effusion. Mediastinum/Nodes: There are no enlarged mediastinal, hilar or axillary lymph nodes.There are surgical clips in the right axilla and probable postsurgical changes laterally in the right breast. There is a moderate size hiatal hernia. The trachea and thyroid gland appear unremarkable. Lungs/Pleura: Trace right-greater-than-left pleural effusions. No pneumothorax. There is emphysema with central airway thickening and mild lower lobe and right middle lobe bronchiectasis. There are scattered small pulmonary nodules, including clustered peribronchovascular nodules in the right upper, middle and lower lobes. Non clustered nodules include a 3 mm right upper lobe nodule on image 42/3 and a subpleural 6 x 3 mm left upper lobe nodule on image 37/3. Upper abdomen: Mild extrahepatic biliary dilatation post cholecystectomy.  There is a large cyst involving the mid right kidney, incompletely visualized. No acute findings. Musculoskeletal/Chest wall: T10 burst fracture with mild osseous retropulsion as described on separate thoracic spine CT report. Mild surrounding soft tissue swelling without focal hematoma. No other acute osseous findings are seen. There are old rib fractures bilaterally. IMPRESSION: 1. T10 burst fracture with mild osseous retropulsion as described on separate thoracic spine CT report. 2. Trace right-greater-than-left pleural effusions. 3. Scattered small pulmonary nodules bilaterally, nonspecific, although likely inflammatory/postinflammatory. Some of these are clustered. This appearance is almost certainly benign, and no dedicated follow-up is required if this patient is low risk for bronchogenic carcinoma (and has no known or suspected primary neoplasm). Non-contrast chest CT can be considered in 12 months if patient is high-risk. This recommendation follows the consensus statement: Guidelines for Management of Incidental Pulmonary Nodules Detected on CT Images: From the Fleischner Society 2017; Radiology 2017; 284:228-243. 4. Aortic Atherosclerosis (ICD10-I70.0) and Emphysema (ICD10-J43.9). Electronically Signed   By: Richardean Sale M.D.   On: 01/17/2019 13:28   CT Cervical Spine Wo Contrast  Result Date: 01/17/2019 CLINICAL DATA:  Patient fell 2 days ago. Head injury during the fall. EXAM: CT HEAD WITHOUT CONTRAST CT CERVICAL SPINE WITHOUT CONTRAST  TECHNIQUE: Multidetector CT imaging of the head and cervical spine was performed following the standard protocol without intravenous contrast. Multiplanar CT image reconstructions of the cervical spine were also generated. COMPARISON:  None. FINDINGS: CT HEAD FINDINGS Brain: There is no evidence for acute hemorrhage, hydrocephalus, mass lesion, or abnormal extra-axial fluid collection. No definite CT evidence for acute infarction. Old lacunar infarct noted left  cerebellar hemisphere. Diffuse loss of parenchymal volume is consistent with atrophy. Patchy low attenuation in the deep hemispheric and periventricular white matter is nonspecific, but likely reflects chronic microvascular ischemic demyelination. Vascular: No hyperdense vessel or unexpected calcification. Skull: No evidence for fracture. No worrisome lytic or sclerotic lesion. Sinuses/Orbits: The visualized paranasal sinuses and mastoid air cells are clear. Visualized portions of the globes and intraorbital fat are unremarkable. Other: None. CT CERVICAL SPINE FINDINGS Alignment: Mild reversal of normal cervical lordosis. There is 3 mm anterolisthesis of C3 on 4 and 2 mm anterolisthesis of C4 on 5, likely related to the associated loss of facet space. Skull base and vertebrae: No acute fracture. No primary bone lesion or focal pathologic process. Soft tissues and spinal canal: No prevertebral fluid or swelling. No visible canal hematoma. Disc levels: Loss of disc height with endplate degeneration noted C5-6 and C6-7. Upper chest: 6 mm pulmonary nodule incompletely visualized in the medial left upper lobe. Other: None. IMPRESSION: 1. No acute intracranial abnormality. Atrophy with chronic small vessel white matter ischemic disease. 2. No cervical spine fracture. 3. Mild reversal of normal cervical lordosis with mild anterolisthesis of C3 on 4 and C4 on 5. This is probably related to the prominent loss of facet space at the same levels, but ligamentous injury could have this appearance. Cervical spine MRI could be used to further evaluate as clinically warranted. 4. 6 mm nodule in the medial left upper lobe has been incompletely visualized. Follow-up CT chest without contrast recommended to further assess and establish baseline follow-up recommendations. Electronically Signed   By: Misty Stanley M.D.   On: 01/17/2019 11:45   CT Lumbar Spine Wo Contrast  Result Date: 01/17/2019 CLINICAL DATA:  84 year old female  status post fall 2 days ago. Continued severe low back pain radiating to the left hip. EXAM: CT LUMBAR SPINE WITHOUT CONTRAST TECHNIQUE: Multidetector CT imaging of the lumbar spine was performed without intravenous contrast administration. Multiplanar CT image reconstructions were also generated. COMPARISON:  CT lumbar spine and MRI 08/28/2017. FINDINGS: Segmentation: Normal, the same numbering system used previously. Alignment: Chronically exaggerated lower lumbar lordosis with grade 1 anterolisthesis of L4 on L5 is stable since 2019. Vertebrae: Osteopenia. The T10 inferior endplate appeared normal by MRI in 2019. There is a faintly visible inferior endplate compression deformity now (series 7, image 54). The visible T10 posterior elements appear to remain intact. There is mild retropulsion of the posteroinferior endplate, with new mild T10-T11 level spinal stenosis. The T11 and T12 vertebrae appear intact. Lumbar levels appear stable and intact. Evidence of healed bilateral S1 sacral ala fractures since 2019. No definite acute sacral fracture, and the SI joints appear intact. Paraspinal and other soft tissues: Stable noncontrast visible abdominal viscera. Moderate size gastric hiatal hernia. Grossly negative costophrenic angles. Mild Aortoiliac calcified atherosclerosis. Diverticulosis of the sigmoid colon. Postoperative changes to the posterior paraspinal soft tissues with no adverse features. Disc levels: Mild for age lumbar spine degeneration above L3-L4. L3-L4: Posterior disc space loss and mild vacuum disc with suspected circumferential disc bulge. Up to moderate posterior element hypertrophy. This level is not  significantly changed. L4-L5: Prior decompression and fusion. L4 pedicle screws appear intact without loosening. L4-L5 interbody implant with stable mild endplate subsidence. No convincing arthrodesis. L5-S1: Prior decompression and fusion. Chronic loosening of the S1 screws. Unilateral left L5  pedicle screw appears intact without loosening. L5-S1 interbody implant with chronic L5 endplate subsidence. Residual posterior element hypertrophy on the left. No convincing arthrodesis. IMPRESSION: 1. Osteopenia. Partially visible and age indeterminate T10 inferior endplate compression fracture is new since August 2019. There is mild associated retropulsion and mild new spinal stenosis at that level. If specific therapy is desired, Thoracic MRI without contrast or Nuclear Medicine Whole-body Bone Scan would best determine acuity and confirm candidacy for vertebroplasty. 2. Healed bilateral S1 sacral ala fractures since 2019 with no acute osseous abnormality identified in the lumbosacral spine. 3. Otherwise stable postoperative changes at L4-L5 and L5-S1 with chronic loosening of the S1 pedicle screws and no convincing arthrodesis. 4. Hiatal hernia.  Sigmoid colon diverticulosis. Electronically Signed   By: Genevie Ann M.D.   On: 01/17/2019 11:45   CT T-SPINE NO CHARGE  Result Date: 01/17/2019 CLINICAL DATA:  Severe low back and left hip pain since falling 2 days ago. T10 fracture partially imaged on earlier lumbar spine CT. EXAM: CT THORACIC SPINE WITHOUT CONTRAST TECHNIQUE: Multidetector CT images of the thoracic were obtained using the standard protocol without intravenous contrast. COMPARISON:  Lumbar spine CT performed earlier today. Previous lumbar CT and MRI 08/28/2017. FINDINGS: Alignment: Normal. Vertebrae: There is a burst fracture of the T10 vertebral body resulting in approximately 65% loss of vertebral body height. There is 5 mm of osseous retropulsion on sagittal image 35/4. The posterior elements appear intact, although there is peripheral displacement of the vertebral body fracture. No other acute osseous findings. The bones are demineralized with endplate degenerative changes throughout the mid to lower thoracic spine. Paraspinal and other soft tissues: Probable mild paraspinal edema at the T10  level. No significant hematoma. Disc levels: Mild degenerative changes throughout the thoracic spine without large disc herniation. The osseous retropulsion involving the inferior endplate of [id number] on the right results in mild mass effect on the thecal sac, not expected to cause cord compression. The right foramen appears mildly narrowed at that level. IMPRESSION: 1. Follow-up CT confirms the presence of a burst fracture of the T10 vertebral body with approximately 65% loss of vertebral body height and 5 mm of osseous retropulsion. Mild mass effect on the thecal sac, not expected to cause cord compression. 2. No other acute osseous findings.  Mild thoracic spondylosis. 3. Please refer to separate chest CT report. Electronically Signed   By: Richardean Sale M.D.   On: 01/17/2019 13:29   DG Humerus Left  Result Date: 01/17/2019 CLINICAL DATA:  Fall 2 days ago.  Pain. EXAM: LEFT HUMERUS - 2+ VIEW COMPARISON:  None. FINDINGS: Two views study shows no evidence for humerus fracture. No worrisome lytic or sclerotic osseous abnormality. Bones are demineralized. AC joint osteoarthritis noted. IMPRESSION: Negative. Electronically Signed   By: Misty Stanley M.D.   On: 01/17/2019 11:32   DG HIP UNILAT WITH PELVIS 2-3 VIEWS LEFT  Result Date: 01/17/2019 CLINICAL DATA:  Fall 2 days ago.  Pain. EXAM: DG HIP (WITH OR WITHOUT PELVIS) 2-3V LEFT COMPARISON:  None. FINDINGS: Frontal pelvis shows diffuse bony demineralization. No evidence for acute fracture. Lumbosacral spinal fusion hardware noted. SI joints and symphysis pubis unremarkable. Antegrade right femoral IM nail has been incompletely visualized. AP and frog-leg lateral views obtained  of the left hip show no femoral neck fracture. Intra grade femoral nail noted with a single proximal interlocking screw, crossing a healed fracture of the proximal femur. IMPRESSION: 1. Osteopenia without acute bony abnormality. 2. Of note, the entire left femoral intramedullary nail has  not been visualized on the study. Electronically Signed   By: Misty Stanley M.D.   On: 01/17/2019 11:35        Scheduled Meds: . amLODipine  2.5 mg Oral Daily  . atorvastatin  40 mg Oral q1800  . calcium carbonate  1,250 mg Oral BID  . cholecalciferol  1,000 Units Oral Daily  . citalopram  30 mg Oral Daily  . diclofenac  75 mg Oral BID  . enoxaparin (LOVENOX) injection  40 mg Subcutaneous Q24H  . levothyroxine  50 mcg Oral Daily  . mirtazapine  15 mg Oral QHS  . tamoxifen  20 mg Oral Daily   Continuous Infusions:   LOS: 0 days    Time spent: 32 mins     Wyvonnia Dusky, MD [facility] Pager [phone]  If 7PM-7AM, please contact night-coverage [email or web address] Password [facility] 01/18/2019, 4:59 PM

## 2019-01-18 NOTE — TOC Transition Note (Signed)
Transition of Care [facility]) - CM/SW Discharge Note   Patient Details  Name: Julie Mckinney MRN: [id number] Date of Birth: [birth year]  [facility]) CM/SW Contact:  Su Hilt, RN Phone Number: 01/18/2019, 12:30 PM   Clinical Narrative:     Met with the patient to discuss DC plan and needs, She lives at home with her husband and has DME at home including RW, [facility], Shower seat grab bars and raised toilet seat she does not need additional DME, she has used [facility] in the past she would like to use them again, I called [place] with [facility] and set up [facility] services, she has accepted the patient        Patient Goals and CMS Choice        Discharge Placement                       Discharge Plan and Services                                     Social Determinants of Health (SDOH) Interventions     Readmission Risk Interventions No flowsheet data found.

## 2019-01-18 NOTE — Evaluation (Signed)
Physical Therapy Evaluation Patient Details Name: Julie Mckinney MRN: [id number] DOB: August 20, 1934 Today's Date: 01/18/2019   History of Present Illness  Per MD H&P: Pt is an 84 y.o. female with medical history significant of COPD, hypertension and arthritis came to ED with complaint of worsening back pain after having a mechanical fall on Christmas morning.  Denies any dizziness or lightheadedness.  Denies any chest pain, shortness of breath or palpitations.  Denies any exertional dyspnea.  She never lost consciousness.  Patient had a chronic lower back pain but now she is having pain in her mid back, nonradiating, aggravating with any movement.  She rested at home for couple of days to see if there is any improvement but pain continued to get worse now interfering with her sleep.  MD assessment includes: T10 vertebral fracture (Dr. Donivan Scull with orthopedics after reviewing her images thinks she might have an older fracture), COPD, and HTN.    Clinical Impression  Pt presented with deficits in strength, transfers, mobility, gait, balance, and activity tolerance.  Pt limited with bed mobility tasks by back pain but required only min A with tasks performed with log roll technique.  Pt required min verbal cues for proper sequencing with transfers and needed extra effort to stand but no physical assistance.  Pt was steady with amb 2 x 100' with one walk with and one without the back brace with pt reporting no difference in back pain with or without the brace.  Pt's SpO2 on room air remained 92-93% throughout the session with no increase in back pain reported.  Pt will benefit from HHPT services upon discharge to safely address above deficits for decreased caregiver assistance and eventual return to PLOF.      Follow Up Recommendations Home health PT;Supervision - Intermittent    Equipment Recommendations  None recommended by PT    Recommendations for Other Services       Precautions /  Restrictions Precautions Precautions: Fall Precaution Comments: No orders for TLSO but per MD note: orthopedic MD did not think that the brace would help but stated pt may try it to see if it helps Restrictions Weight Bearing Restrictions: No      Mobility  Bed Mobility Overal bed mobility: Needs Assistance Bed Mobility: Rolling;Sit to Sidelying;Sidelying to Sit Rolling: Supervision Sidelying to sit: Min assist     Sit to sidelying: Min assist General bed mobility comments: Log roll training provided with min A going from sidelying to/from sit and min verbal cues for sequencing  Transfers Overall transfer level: Needs assistance Equipment used: Rolling walker (2 wheeled) Transfers: Sit to/from Stand Sit to Stand: Min guard         General transfer comment: Min verbal cues to limit bending and for general sequencing  Ambulation/Gait Ambulation/Gait assistance: Min guard Gait Distance (Feet): 100 Feet x 2 Assistive device: Rolling walker (2 wheeled) Gait Pattern/deviations: Step-through pattern;Decreased step length - left;Decreased step length - right Gait velocity: decreased   General Gait Details: Slow cadence but steady without LOB  Stairs            Wheelchair Mobility    Modified Rankin (Stroke Patients Only)       Balance Overall balance assessment: Needs assistance Sitting-balance support: Feet supported Sitting balance-Leahy Scale: Good     Standing balance support: Bilateral upper extremity supported;During functional activity Standing balance-Leahy Scale: Good  Pertinent Vitals/Pain Pain Assessment: 0-10 Pain Score: 8  Pain Location: back Pain Descriptors / Indicators: Aching;Sore Pain Intervention(s): Premedicated before session;Monitored during session    [place] expects to be discharged to:: Private residence Living Arrangements: Spouse/significant other Available Help at  Discharge: Family;Available 24 hours/day Type of Home: Independent living facility Home Access: Level entry     Home Layout: One level Home Equipment: Walker - 2 wheels;Cane - single point;Grab bars - tub/shower;Grab bars - toilet;Shower seat      Prior Function Level of Independence: Independent with assistive device(s)         Comments: Ind amb in home without AD, Mod Ind amb community distances with a SPC, Ind with ADLs, 4 falls in last 12 months; facility provides lunch daily and apt cleaning, pt pays for lady to do laundry     Hand Dominance        Extremity/Trunk Assessment   Upper Extremity Assessment Upper Extremity Assessment: Generalized weakness    Lower Extremity Assessment Lower Extremity Assessment: Generalized weakness       Communication   Communication: No difficulties  Cognition Arousal/Alertness: Awake/alert Behavior During Therapy: WFL for tasks assessed/performed Overall Cognitive Status: Within Functional Limits for tasks assessed                                        General Comments      Exercises Total Joint Exercises Ankle Circles/Pumps: Strengthening;Both;10 reps;15 reps Quad Sets: Strengthening;Both;10 reps;15 reps Gluteal Sets: Strengthening;Both;10 reps Heel Slides: AROM;Both;5 reps Hip ABduction/ADduction: AROM;Strengthening;Both;5 reps Long Arc Quad: Strengthening;Both;10 reps;15 reps Knee Flexion: Strengthening;Both;10 reps;15 reps Marching in Standing: AROM;Both;10 reps;Standing Other Exercises Other Exercises: HEP education for BLE APs, QS, GS, and LAQ x 10 each 5-6x/day Other Exercises: General back precaution training for decreased bending and log rolling with bed mobility   Assessment/Plan    PT Assessment Patient needs continued PT services  PT Problem List Decreased strength;Decreased activity tolerance;Decreased balance;Decreased mobility;Decreased knowledge of use of DME; history of falling        PT Treatment Interventions DME instruction;Gait training;Functional mobility training;Therapeutic activities;Therapeutic exercise;Balance training;Patient/family education    PT Goals (Current goals can be found in the Care Plan section)  Acute Rehab PT Goals Patient Stated Goal: To walk better without pain PT Goal Formulation: With patient Time For Goal Achievement: 01/31/19 Potential to Achieve Goals: Fair    Frequency Min 2X/week   Barriers to discharge        Co-evaluation               AM-PAC PT "6 Clicks" Mobility  Outcome Measure Help needed turning from your back to your side while in a flat bed without using bedrails?: A Little Help needed moving from lying on your back to sitting on the side of a flat bed without using bedrails?: A Little Help needed moving to and from a bed to a chair (including a wheelchair)?: A Little Help needed standing up from a chair using your arms (e.g., wheelchair or bedside chair)?: A Little Help needed to walk in hospital room?: A Little Help needed climbing 3-5 steps with a railing? : A Little 6 Click Score: 18    End of Session Equipment Utilized During Treatment: Gait belt Activity Tolerance: Patient tolerated treatment well Patient left: in chair;with call bell/phone within reach;with chair alarm set Nurse Communication: Mobility status PT Visit Diagnosis: Muscle  weakness (generalized) (M62.81);Difficulty in walking, not elsewhere classified (R26.2);Pain Pain - Right/Left: (central) Pain - part of body: (back)    Time: [id number] PT Time Calculation (min) (ACUTE ONLY): 42 min   Charges:   PT Evaluation $PT Eval Moderate Complexity: 1 Mod PT Treatments $Therapeutic Exercise: 8-22 mins $Therapeutic Activity: 8-22 mins        D. Scott Brodie Scovell PT, DPT 01/18/19, 12:05 PM

## 2019-01-18 NOTE — Progress Notes (Signed)
Pt alert and oriented. Medicated for pain at bedtime. Pt able to sleep in between care. Voiding without difficulty.

## 2019-01-19 ENCOUNTER — Encounter: Payer: Medicare Other | Admitting: Physical Therapy

## 2019-01-19 DIAGNOSIS — I10 Essential (primary) hypertension: Secondary | ICD-10-CM

## 2019-01-19 DIAGNOSIS — E7849 Other hyperlipidemia: Secondary | ICD-10-CM | POA: Diagnosis not present

## 2019-01-19 DIAGNOSIS — S22078A Other fracture of T9-T10 vertebra, initial encounter for closed fracture: Secondary | ICD-10-CM

## 2019-01-19 DIAGNOSIS — S22081A Stable burst fracture of T11-T12 vertebra, initial encounter for closed fracture: Secondary | ICD-10-CM | POA: Diagnosis not present

## 2019-01-19 LAB — BASIC METABOLIC PANEL
Anion gap: 9 (ref 5–15)
BUN: 27 mg/dL — ABNORMAL HIGH (ref 8–23)
CO2: 22 mmol/L (ref 22–32)
Calcium: 8.5 mg/dL — ABNORMAL LOW (ref 8.9–10.3)
Chloride: 111 mmol/L (ref 98–111)
Creatinine, Ser: 0.56 mg/dL (ref 0.44–1.00)
GFR calc Af Amer: >60 mL/min (ref >60)
GFR calc non Af Amer: >60 mL/min (ref >60)
Glucose, Bld: 104 mg/dL — ABNORMAL HIGH (ref 70–99)
Potassium: 4 mmol/L (ref 3.5–5.1)
Sodium: 142 mmol/L (ref 135–145)

## 2019-01-19 LAB — CBC
HCT: 32.2 % — ABNORMAL LOW (ref 36.0–46.0)
Hemoglobin: 10.7 g/dL — ABNORMAL LOW (ref 12.0–15.0)
MCH: 31.5 pg (ref 26.0–34.0)
MCHC: 33.2 g/dL (ref 30.0–36.0)
MCV: 94.7 fL (ref 80.0–100.0)
Platelets: 189 10*3/uL (ref 150–400)
RBC: 3.4 MIL/uL — ABNORMAL LOW (ref 3.87–5.11)
RDW: 13.8 % (ref 11.5–15.5)
WBC: 7.3 10*3/uL (ref 4.0–10.5)
nRBC: 0 % (ref 0.0–0.2)

## 2019-01-19 MED ORDER — ACETAMINOPHEN 325 MG PO TABS: 650.0000 mg | ORAL_TABLET | Freq: Four times a day (QID) | ORAL | Status: AC | PRN

## 2019-01-19 MED ORDER — TRAMADOL HCL 50 MG PO TABS: 50.0000 mg | ORAL_TABLET | Freq: Four times a day (QID) | ORAL | 0 refills | Status: AC | PRN

## 2019-01-19 NOTE — TOC Transition Note (Signed)
Transition of Care [facility]) - CM/SW Discharge Note   Patient Details  Name: Buffy Jarecki MRN: [id number] Date of Birth: July 02, 1934  [facility] CM/SW Contact:  Su Hilt, RN Phone Number: 01/19/2019, 1:48 PM   Clinical Narrative:    Patient to DC home today with [facility] set up for [facility] services, she does not need DME as she is equiped at home, no additional needs   Final next level of care: Home w Home Health Services Barriers to Discharge: Barriers Resolved   Patient Goals and CMS Choice Patient states their goals for this hospitalization and ongoing recovery are:: go home CMS Medicare.gov Compare Post Acute Care list provided to:: Patient Choice offered to / list presented to : Patient  Discharge Placement                       Discharge Plan and Services   Discharge Planning Services: CM Consult Post Acute Care Choice: Home Health          DME Arranged: N/A         HH Arranged: PT, [place] Agency: [facility] [place] Date [facility] Agency Contacted: 01/18/19 Time [place]: [id number] Representative spoke with at [place]: [place]  Social Determinants of Health ([place]) Interventions     Readmission Risk Interventions No flowsheet data found.

## 2019-01-19 NOTE — Discharge Summary (Signed)
Physician Discharge Summary  Julie Mckinney DOB: 12-13-1934 DOA: 01/17/2019  PCP: Dion Body, MD  Admit date: 01/17/2019 Discharge date: 01/19/2019  Admitted From: home Disposition:  Home w/ home health  Recommendations for Outpatient Follow-up:  1. Follow up with PCP in 1-2 weeks 2. F/u w/ ortho surg in 2 weeks  Home Health: yes Equipment/Devices:  Discharge Condition: stable CODE STATUS: DNR Diet recommendation: Heart Healthy  Brief/Interim Summary: HPI taken from Dr. Reesa Chew:  Julie Mckinney is a 84 y.o. female with medical history significant of COPD, hypertension and arthritis came to ED with complaint of worsening back pain after having a mechanical fall on Christmas morning.  According to patient she certainly changed her mind and her and rapidly resulted in tripping and falling.  Denies any dizziness or lightheadedness.  Denies any chest pain, shortness of breath or palpitations.  Denies any exertional dyspnea.  She never lost consciousness.  Patient had a chronic lower back pain but now she is having pain in her mid back, nonradiating, aggravating with any movement.  She rested at home for couple of days to see if there is any improvement but pain continued to get worse now interfering with her sleep.  Nuys any radiation of pain, tingling or numbness or focal weakness.  No urinary or fecal incontinence.  No saddle anesthesia.   She denies any nausea or vomiting or diarrhea.  No fever or chills.  No sick contacts.  No urinary symptoms.   Patient would like to try conservative management first.  ED Course: On presentation to ED she was hemodynamically stable, UA with few bacteria no leukocytes or RBCs.  CBC without any leukocytosis, COVID-19 pending.Pan CT scan with burst fracture of T10.  Mild mass-effect on thecal sac.   Hospital Course from Dr. Lenise Herald 12/28-12/29/20: Pt was treated w/ conservative therapy for T10 vertebral fracture. A back brace  was ordered in the ER but pt was unable to tolerate using the brace. Pt was also treated w/ pain meds and PT. PT recs home health PT. CM set home health prior to d/c.  Discharge Diagnoses:  Active Problems:   T10 vertebral fracture (HCC)  T10 vertebral fracture.Secondary to fall.Discussed with Dr. Donivan Scull with orthopedic,after reviewing her images he thinks she might have an older fracture,in order to discriminate whether it is new she will need an MRI but will try conservative management. Back brace prn. PT recs home health which was set up by CM already. Tramadol & oxycodone prn  COPD: w/o acute exacerbation. Continue on home bronchodilators   Hypertension: will restart home dose of amlodipine. Hydralazine prn   HLD: continue on statin  Hypothyroidism: will continue on home dose of levothyroxine  Depression: severity unknown. Will continue on home dose of citalopram  Vitamin D deficiency: continue on vitamin D supplements  Discharge Instructions  Discharge Instructions    Diet - low sodium heart healthy   Complete by: As directed    Discharge instructions   Complete by: As directed    F/U w/ PCP in 1-2 weeks; F/u ortho surg in 2 weeks   Increase activity slowly   Complete by: As directed      Allergies as of 01/19/2019      Reactions   Anastrozole Other (See Comments)   Leg pain   Letrozole    Other reaction(s): Unknown Leg pain   Tape Rash      Medication List    STOP taking these medications   atorvastatin 40 MG  tablet Commonly known as: LIPITOR     TAKE these medications   acetaminophen 325 MG tablet Commonly known as: TYLENOL Take 2 tablets (650 mg total) by mouth every 6 (six) hours as needed for mild pain (or Fever >/= 101).   albuterol 108 (90 Base) MCG/ACT inhaler Commonly known as: VENTOLIN HFA Inhale 2 puffs into the lungs every 6 (six) hours as needed.   amLODipine 2.5 MG tablet Commonly known as: NORVASC Take 2.5 mg by mouth  daily.   ARIPiprazole 2 MG tablet Commonly known as: ABILIFY Take 2 mg by mouth daily.   Azelastine-Fluticasone 137-50 MCG/ACT Susp Place 1 spray into the nose daily.   BREO ELLIPTA IN Inhale 1 puff into the lungs daily.   Calcium 600 600 MG Tabs tablet Generic drug: calcium carbonate Take 600 mg by mouth 2 (two) times daily.   citalopram 20 MG tablet Commonly known as: CELEXA Take 1.5 tablets (30 mg total) by mouth daily.   clobetasol cream 0.05 % Commonly known as: TEMOVATE   clotrimazole-betamethasone cream Commonly known as: LOTRISONE Apply 1 application topically 2 (two) times daily.   diclofenac 75 MG EC tablet Commonly known as: VOLTAREN Take 75 mg by mouth 2 (two) times daily.   fluconazole 150 MG tablet Commonly known as: DIFLUCAN Take 150 mg by mouth once.   fluticasone 50 MCG/ACT nasal spray Commonly known as: FLONASE Place 1 spray into the nose as needed.   GLUCOSAMINE SULFATE PO Take 1 capsule by mouth 2 (two) times daily.   hydrOXYzine 25 MG tablet Commonly known as: ATARAX/VISTARIL Take 1 tablet (25 mg total) by mouth daily as needed for anxiety. What changed: when to take this   levothyroxine 50 MCG tablet Commonly known as: SYNTHROID Take 50 mcg by mouth daily.   Melatonin 5 MG Tabs Take by mouth.   mirabegron ER 25 MG Tb24 tablet Commonly known as: MYRBETRIQ Take by mouth.   mirabegron ER 50 MG Tb24 tablet Commonly known as: MYRBETRIQ Take by mouth.   mirtazapine 15 MG tablet Commonly known as: REMERON Take 1 tablet (15 mg total) by mouth at bedtime. For sleep and mood   Multi-Vitamins Tabs Take 1 tablet by mouth daily.   omeprazole 20 MG capsule Commonly known as: PRILOSEC Take 20 mg by mouth daily.   Potassium 99 MG Tabs Take 1 tablet by mouth daily.   simvastatin 80 MG tablet Commonly known as: ZOCOR Take 80 mg by mouth daily.   tamoxifen 20 MG tablet Commonly known as: NOLVADEX Take 1 tablet (20 mg total) by mouth  daily.   terconazole 80 MG vaginal suppository Commonly known as: TERAZOL 3 Place vaginally.   tiotropium 18 MCG inhalation capsule Commonly known as: SPIRIVA Place 1 capsule into inhaler and inhale as needed.   traMADol 50 MG tablet Commonly known as: ULTRAM Take 1 tablet (50 mg total) by mouth every 6 (six) hours as needed for up to 5 days for moderate pain or severe pain. What changed: reasons to take this   vitamin B-12 1000 MCG tablet Commonly known as: CYANOCOBALAMIN Take 1,000 mcg by mouth daily.   Vitamin D3 25 MCG (1000 UT) Caps Take 1,000 mg by mouth daily.       Allergies  Allergen Reactions  . Anastrozole Other (See Comments)    Leg pain  . Letrozole     Other reaction(s): Unknown Leg pain  . Tape Rash    Consultations:  Ortho surg   Procedures/Studies: DG Chest 2  View  Result Date: 01/17/2019 CLINICAL DATA:  Fall. EXAM: CHEST - 2 VIEW COMPARISON:  None. FINDINGS: The heart size and mediastinal contours are within normal limits. There is no evidence of pulmonary edema, consolidation, pneumothorax, nodule or pleural fluid. The visualized skeletal structures are unremarkable. IMPRESSION: No active cardiopulmonary disease. Electronically Signed   By: Aletta Edouard M.D.   On: 01/17/2019 11:34   CT Head Wo Contrast  Result Date: 01/17/2019 CLINICAL DATA:  Patient fell 2 days ago. Head injury during the fall. EXAM: CT HEAD WITHOUT CONTRAST CT CERVICAL SPINE WITHOUT CONTRAST TECHNIQUE: Multidetector CT imaging of the head and cervical spine was performed following the standard protocol without intravenous contrast. Multiplanar CT image reconstructions of the cervical spine were also generated. COMPARISON:  None. FINDINGS: CT HEAD FINDINGS Brain: There is no evidence for acute hemorrhage, hydrocephalus, mass lesion, or abnormal extra-axial fluid collection. No definite CT evidence for acute infarction. Old lacunar infarct noted left cerebellar hemisphere.  Diffuse loss of parenchymal volume is consistent with atrophy. Patchy low attenuation in the deep hemispheric and periventricular white matter is nonspecific, but likely reflects chronic microvascular ischemic demyelination. Vascular: No hyperdense vessel or unexpected calcification. Skull: No evidence for fracture. No worrisome lytic or sclerotic lesion. Sinuses/Orbits: The visualized paranasal sinuses and mastoid air cells are clear. Visualized portions of the globes and intraorbital fat are unremarkable. Other: None. CT CERVICAL SPINE FINDINGS Alignment: Mild reversal of normal cervical lordosis. There is 3 mm anterolisthesis of C3 on 4 and 2 mm anterolisthesis of C4 on 5, likely related to the associated loss of facet space. Skull base and vertebrae: No acute fracture. No primary bone lesion or focal pathologic process. Soft tissues and spinal canal: No prevertebral fluid or swelling. No visible canal hematoma. Disc levels: Loss of disc height with endplate degeneration noted C5-6 and C6-7. Upper chest: 6 mm pulmonary nodule incompletely visualized in the medial left upper lobe. Other: None. IMPRESSION: 1. No acute intracranial abnormality. Atrophy with chronic small vessel white matter ischemic disease. 2. No cervical spine fracture. 3. Mild reversal of normal cervical lordosis with mild anterolisthesis of C3 on 4 and C4 on 5. This is probably related to the prominent loss of facet space at the same levels, but ligamentous injury could have this appearance. Cervical spine MRI could be used to further evaluate as clinically warranted. 4. 6 mm nodule in the medial left upper lobe has been incompletely visualized. Follow-up CT chest without contrast recommended to further assess and establish baseline follow-up recommendations. Electronically Signed   By: Misty Stanley M.D.   On: 01/17/2019 11:45   CT Chest Wo Contrast  Result Date: 01/17/2019 CLINICAL DATA:  Severe low back and left hip pain since falling 2  days ago. T10 fracture partially imaged on earlier lumbar spine CT. Chest pain or shortness of breath, pleurisy or effusion suspected. EXAM: CT CHEST WITHOUT CONTRAST TECHNIQUE: Multidetector CT imaging of the chest was performed following the standard protocol without IV contrast. COMPARISON:  CTs of the thoracic and lumbar spine same date. FINDINGS: Cardiovascular: Minimal atherosclerosis of the aorta and great vessels. No significant involvement of the coronary arteries. No acute vascular findings on noncontrast imaging. The heart size is normal. There is no pericardial effusion. Mediastinum/Nodes: There are no enlarged mediastinal, hilar or axillary lymph nodes.There are surgical clips in the right axilla and probable postsurgical changes laterally in the right breast. There is a moderate size hiatal hernia. The trachea and thyroid gland appear unremarkable. Lungs/Pleura: Trace  right-greater-than-left pleural effusions. No pneumothorax. There is emphysema with central airway thickening and mild lower lobe and right middle lobe bronchiectasis. There are scattered small pulmonary nodules, including clustered peribronchovascular nodules in the right upper, middle and lower lobes. Non clustered nodules include a 3 mm right upper lobe nodule on image 42/3 and a subpleural 6 x 3 mm left upper lobe nodule on image 37/3. Upper abdomen: Mild extrahepatic biliary dilatation post cholecystectomy. There is a large cyst involving the mid right kidney, incompletely visualized. No acute findings. Musculoskeletal/Chest wall: T10 burst fracture with mild osseous retropulsion as described on separate thoracic spine CT report. Mild surrounding soft tissue swelling without focal hematoma. No other acute osseous findings are seen. There are old rib fractures bilaterally. IMPRESSION: 1. T10 burst fracture with mild osseous retropulsion as described on separate thoracic spine CT report. 2. Trace right-greater-than-left pleural  effusions. 3. Scattered small pulmonary nodules bilaterally, nonspecific, although likely inflammatory/postinflammatory. Some of these are clustered. This appearance is almost certainly benign, and no dedicated follow-up is required if this patient is low risk for bronchogenic carcinoma (and has no known or suspected primary neoplasm). Non-contrast chest CT can be considered in 12 months if patient is high-risk. This recommendation follows the consensus statement: Guidelines for Management of Incidental Pulmonary Nodules Detected on CT Images: From the Fleischner Society 2017; Radiology 2017; 284:228-243. 4. Aortic Atherosclerosis (ICD10-I70.0) and Emphysema (ICD10-J43.9). Electronically Signed   By: Richardean Sale M.D.   On: 01/17/2019 13:28   CT Cervical Spine Wo Contrast  Result Date: 01/17/2019 CLINICAL DATA:  Patient fell 2 days ago. Head injury during the fall. EXAM: CT HEAD WITHOUT CONTRAST CT CERVICAL SPINE WITHOUT CONTRAST TECHNIQUE: Multidetector CT imaging of the head and cervical spine was performed following the standard protocol without intravenous contrast. Multiplanar CT image reconstructions of the cervical spine were also generated. COMPARISON:  None. FINDINGS: CT HEAD FINDINGS Brain: There is no evidence for acute hemorrhage, hydrocephalus, mass lesion, or abnormal extra-axial fluid collection. No definite CT evidence for acute infarction. Old lacunar infarct noted left cerebellar hemisphere. Diffuse loss of parenchymal volume is consistent with atrophy. Patchy low attenuation in the deep hemispheric and periventricular white matter is nonspecific, but likely reflects chronic microvascular ischemic demyelination. Vascular: No hyperdense vessel or unexpected calcification. Skull: No evidence for fracture. No worrisome lytic or sclerotic lesion. Sinuses/Orbits: The visualized paranasal sinuses and mastoid air cells are clear. Visualized portions of the globes and intraorbital fat are  unremarkable. Other: None. CT CERVICAL SPINE FINDINGS Alignment: Mild reversal of normal cervical lordosis. There is 3 mm anterolisthesis of C3 on 4 and 2 mm anterolisthesis of C4 on 5, likely related to the associated loss of facet space. Skull base and vertebrae: No acute fracture. No primary bone lesion or focal pathologic process. Soft tissues and spinal canal: No prevertebral fluid or swelling. No visible canal hematoma. Disc levels: Loss of disc height with endplate degeneration noted C5-6 and C6-7. Upper chest: 6 mm pulmonary nodule incompletely visualized in the medial left upper lobe. Other: None. IMPRESSION: 1. No acute intracranial abnormality. Atrophy with chronic small vessel white matter ischemic disease. 2. No cervical spine fracture. 3. Mild reversal of normal cervical lordosis with mild anterolisthesis of C3 on 4 and C4 on 5. This is probably related to the prominent loss of facet space at the same levels, but ligamentous injury could have this appearance. Cervical spine MRI could be used to further evaluate as clinically warranted. 4. 6 mm nodule in the medial  left upper lobe has been incompletely visualized. Follow-up CT chest without contrast recommended to further assess and establish baseline follow-up recommendations. Electronically Signed   By: Misty Stanley M.D.   On: 01/17/2019 11:45   CT Lumbar Spine Wo Contrast  Result Date: 01/17/2019 CLINICAL DATA:  84 year old female status post fall 2 days ago. Continued severe low back pain radiating to the left hip. EXAM: CT LUMBAR SPINE WITHOUT CONTRAST TECHNIQUE: Multidetector CT imaging of the lumbar spine was performed without intravenous contrast administration. Multiplanar CT image reconstructions were also generated. COMPARISON:  CT lumbar spine and MRI 08/28/2017. FINDINGS: Segmentation: Normal, the same numbering system used previously. Alignment: Chronically exaggerated lower lumbar lordosis with grade 1 anterolisthesis of L4 on L5 is  stable since 2019. Vertebrae: Osteopenia. The T10 inferior endplate appeared normal by MRI in 2019. There is a faintly visible inferior endplate compression deformity now (series 7, image 54). The visible T10 posterior elements appear to remain intact. There is mild retropulsion of the posteroinferior endplate, with new mild T10-T11 level spinal stenosis. The T11 and T12 vertebrae appear intact. Lumbar levels appear stable and intact. Evidence of healed bilateral S1 sacral ala fractures since 2019. No definite acute sacral fracture, and the SI joints appear intact. Paraspinal and other soft tissues: Stable noncontrast visible abdominal viscera. Moderate size gastric hiatal hernia. Grossly negative costophrenic angles. Mild Aortoiliac calcified atherosclerosis. Diverticulosis of the sigmoid colon. Postoperative changes to the posterior paraspinal soft tissues with no adverse features. Disc levels: Mild for age lumbar spine degeneration above L3-L4. L3-L4: Posterior disc space loss and mild vacuum disc with suspected circumferential disc bulge. Up to moderate posterior element hypertrophy. This level is not significantly changed. L4-L5: Prior decompression and fusion. L4 pedicle screws appear intact without loosening. L4-L5 interbody implant with stable mild endplate subsidence. No convincing arthrodesis. L5-S1: Prior decompression and fusion. Chronic loosening of the S1 screws. Unilateral left L5 pedicle screw appears intact without loosening. L5-S1 interbody implant with chronic L5 endplate subsidence. Residual posterior element hypertrophy on the left. No convincing arthrodesis. IMPRESSION: 1. Osteopenia. Partially visible and age indeterminate T10 inferior endplate compression fracture is new since August 2019. There is mild associated retropulsion and mild new spinal stenosis at that level. If specific therapy is desired, Thoracic MRI without contrast or Nuclear Medicine Whole-body Bone Scan would best determine  acuity and confirm candidacy for vertebroplasty. 2. Healed bilateral S1 sacral ala fractures since 2019 with no acute osseous abnormality identified in the lumbosacral spine. 3. Otherwise stable postoperative changes at L4-L5 and L5-S1 with chronic loosening of the S1 pedicle screws and no convincing arthrodesis. 4. Hiatal hernia.  Sigmoid colon diverticulosis. Electronically Signed   By: Genevie Ann M.D.   On: 01/17/2019 11:45   CT T-SPINE NO CHARGE  Result Date: 01/17/2019 CLINICAL DATA:  Severe low back and left hip pain since falling 2 days ago. T10 fracture partially imaged on earlier lumbar spine CT. EXAM: CT THORACIC SPINE WITHOUT CONTRAST TECHNIQUE: Multidetector CT images of the thoracic were obtained using the standard protocol without intravenous contrast. COMPARISON:  Lumbar spine CT performed earlier today. Previous lumbar CT and MRI 08/28/2017. FINDINGS: Alignment: Normal. Vertebrae: There is a burst fracture of the T10 vertebral body resulting in approximately 65% loss of vertebral body height. There is 5 mm of osseous retropulsion on sagittal image 35/4. The posterior elements appear intact, although there is peripheral displacement of the vertebral body fracture. No other acute osseous findings. The bones are demineralized with endplate degenerative changes  throughout the mid to lower thoracic spine. Paraspinal and other soft tissues: Probable mild paraspinal edema at the T10 level. No significant hematoma. Disc levels: Mild degenerative changes throughout the thoracic spine without large disc herniation. The osseous retropulsion involving the inferior endplate of [id number] on the right results in mild mass effect on the thecal sac, not expected to cause cord compression. The right foramen appears mildly narrowed at that level. IMPRESSION: 1. Follow-up CT confirms the presence of a burst fracture of the T10 vertebral body with approximately 65% loss of vertebral body height and 5 mm of osseous  retropulsion. Mild mass effect on the thecal sac, not expected to cause cord compression. 2. No other acute osseous findings.  Mild thoracic spondylosis. 3. Please refer to separate chest CT report. Electronically Signed   By: Richardean Sale M.D.   On: 01/17/2019 13:29   DG Humerus Left  Result Date: 01/17/2019 CLINICAL DATA:  Fall 2 days ago.  Pain. EXAM: LEFT HUMERUS - 2+ VIEW COMPARISON:  None. FINDINGS: Two views study shows no evidence for humerus fracture. No worrisome lytic or sclerotic osseous abnormality. Bones are demineralized. AC joint osteoarthritis noted. IMPRESSION: Negative. Electronically Signed   By: Misty Stanley M.D.   On: 01/17/2019 11:32   DG HIP UNILAT WITH PELVIS 2-3 VIEWS LEFT  Result Date: 01/17/2019 CLINICAL DATA:  Fall 2 days ago.  Pain. EXAM: DG HIP (WITH OR WITHOUT PELVIS) 2-3V LEFT COMPARISON:  None. FINDINGS: Frontal pelvis shows diffuse bony demineralization. No evidence for acute fracture. Lumbosacral spinal fusion hardware noted. SI joints and symphysis pubis unremarkable. Antegrade right femoral IM nail has been incompletely visualized. AP and frog-leg lateral views obtained of the left hip show no femoral neck fracture. Intra grade femoral nail noted with a single proximal interlocking screw, crossing a healed fracture of the proximal femur. IMPRESSION: 1. Osteopenia without acute bony abnormality. 2. Of note, the entire left femoral intramedullary nail has not been visualized on the study. Electronically Signed   By: Misty Stanley M.D.   On: 01/17/2019 11:35       Subjective: Pt c/o back pain  Discharge Exam: Vitals:   01/18/19 2324 01/19/19 0720  BP: 131/66 (!) 166/88  Pulse: 71 80  Resp: 18 17  Temp: 98 F (36.7 C) 97.8 F (36.6 C)  SpO2: 98% 96%   Vitals:   01/18/19 0734 01/18/19 1421 01/18/19 2324 01/19/19 0720  BP: (!) 142/60 (!) 150/73 131/66 (!) 166/88  Pulse: 70 75 71 80  Resp: 17 16 18 17   Temp: 98.3 F (36.8 C) 98 F (36.7 C) 98 F  (36.7 C) 97.8 F (36.6 C)  TempSrc: Oral Oral Oral Oral  SpO2: 95% 96% 98% 96%  Weight:      Height:        General: Pt is alert, awake, not in acute distress Cardiovascular:  S1/S2 +, no rubs, no gallops Respiratory: CTA bilaterally, no wheezing, no rhonchi Abdominal: Soft, NT, ND, bowel sounds + Extremities: no edema, no cyanosis    The results of significant diagnostics from this hospitalization (including imaging, microbiology, ancillary and laboratory) are listed below for reference.     Microbiology: Recent Results (from the past 240 hour(s))  SARS CORONAVIRUS 2 (TAT 6-24 HRS) Nasopharyngeal Nasopharyngeal Swab     Status: None   Collection Time: 01/17/19  3:16 PM   Specimen: Nasopharyngeal Swab  Result Value Ref Range Status   SARS Coronavirus 2 NEGATIVE NEGATIVE Final    Comment: (NOTE) SARS-CoV-2  target nucleic acids are NOT DETECTED. The SARS-CoV-2 RNA is generally detectable in upper and lower respiratory specimens during the acute phase of infection. Negative results do not preclude SARS-CoV-2 infection, do not rule out co-infections with other pathogens, and should not be used as the sole basis for treatment or other patient management decisions. Negative results must be combined with clinical observations, patient history, and epidemiological information. The expected result is Negative. Fact Sheet for Patients: SugarRoll.be Fact Sheet for Healthcare Providers: https://www.woods-mathews.com/ This test is not yet approved or cleared by the [place] FDA and  has been authorized for detection and/or diagnosis of SARS-CoV-2 by FDA under an Emergency Use Authorization (EUA). This EUA will remain  in effect (meaning this test can be used) for the duration of the COVID-19 declaration under Section 56 4(b)(1) of the Act, 21 U.S.C. section 360bbb-3(b)(1), unless the authorization is terminated or revoked  sooner. Performed at [facility], [place] [address] [place],  [address]      Labs: BNP (last 3 results) No results for input(s): BNP in the last 8760 hours. Basic Metabolic Panel: Recent Labs  Lab 01/17/19 1150 01/18/19 0626 01/19/19 0441  NA 140 142 142  K 4.3 4.0 4.0  CL 108 109 111  CO2 24 24 22   GLUCOSE 95 75 104*  BUN 14 18 27*  CREATININE 0.65 0.75 0.56  CALCIUM 8.2* 8.4* 8.5*   Liver Function Tests: Recent Labs  Lab 01/17/19 1150  AST 20  ALT 18  ALKPHOS 34*  BILITOT 0.8  PROT 6.3*  ALBUMIN 3.5   No results for input(s): LIPASE, AMYLASE in the last 168 hours. No results for input(s): AMMONIA in the last 168 hours. CBC: Recent Labs  Lab 01/17/19 1150 01/18/19 0626 01/19/19 0441  WBC 7.2 6.9 7.3  NEUTROABS 5.7  --   --   HGB 11.2* 11.3* 10.7*  HCT 34.2* 34.9* 32.2*  MCV 96.1 96.1 94.7  PLT 180 192 189   Cardiac Enzymes: No results for input(s): CKTOTAL, CKMB, CKMBINDEX, TROPONINI in the last 168 hours. BNP: Invalid input(s): POCBNP CBG: No results for input(s): GLUCAP in the last 168 hours. D-Dimer No results for input(s): DDIMER in the last 72 hours. Hgb A1c No results for input(s): HGBA1C in the last 72 hours. Lipid Profile No results for input(s): CHOL, HDL, LDLCALC, TRIG, CHOLHDL, LDLDIRECT in the last 72 hours. Thyroid function studies No results for input(s): TSH, T4TOTAL, T3FREE, THYROIDAB in the last 72 hours.  Invalid input(s): FREET3 Anemia work up No results for input(s): VITAMINB12, FOLATE, FERRITIN, TIBC, IRON, RETICCTPCT in the last 72 hours. Urinalysis    Component Value Date/Time   COLORURINE YELLOW (A) 01/17/2019 1512   APPEARANCEUR CLEAR (A) 01/17/2019 1512   LABSPEC 1.014 01/17/2019 1512   PHURINE 5.0 01/17/2019 1512   GLUCOSEU NEGATIVE 01/17/2019 1512   HGBUR NEGATIVE 01/17/2019 1512   BILIRUBINUR NEGATIVE 01/17/2019 1512   KETONESUR 5 (A) 01/17/2019 1512   PROTEINUR NEGATIVE 01/17/2019 1512   NITRITE  NEGATIVE 01/17/2019 1512   LEUKOCYTESUR NEGATIVE 01/17/2019 1512   Sepsis Labs Invalid input(s): PROCALCITONIN,  WBC,  LACTICIDVEN Microbiology Recent Results (from the past 240 hour(s))  SARS CORONAVIRUS 2 (TAT 6-24 HRS) Nasopharyngeal Nasopharyngeal Swab     Status: None   Collection Time: 01/17/19  3:16 PM   Specimen: Nasopharyngeal Swab  Result Value Ref Range Status   SARS Coronavirus 2 NEGATIVE NEGATIVE Final    Comment: (NOTE) SARS-CoV-2 target nucleic acids are NOT DETECTED.  The SARS-CoV-2 RNA is generally detectable in upper and lower respiratory specimens during the acute phase of infection. Negative results do not preclude SARS-CoV-2 infection, do not rule out co-infections with other pathogens, and should not be used as the sole basis for treatment or other patient management decisions. Negative results must be combined with clinical observations, patient history, and epidemiological information. The expected result is Negative. Fact Sheet for Patients: Sugar[employer].be Fact Sheet for Healthcare Providers: https://www.woods-mathews.com/ This test is not yet approved or cleared by the [place] FDA and  has been authorized for detection and/or diagnosis of SARS-CoV-2 by FDA under an Emergency Use Authorization (EUA). This EUA will remain  in effect (meaning this test can be used) for the duration of the COVID-19 declaration under Section 56 4(b)(1) of the Act, 21 U.S.C. section 360bbb-3(b)(1), unless the authorization is terminated or revoked sooner. Performed at [facility], [place] [address] [place], [place] [address]      Time coordinating discharge: Over 30 minutes  SIGNED:   Wyvonnia Dusky, MD  [facility] 01/19/2019, 12:26 PM Pager   If 7PM-7AM, please contact night-coverage [email or web address] Password [email or web address]

## 2019-01-19 NOTE — Care Management Obs Status (Signed)
[place] NOTIFICATION   Patient Details  Name: Julie Mckinney MRN: [id number] Date of Birth: [birth year]   Medicare Observation Status Notification Given:  Yes    Su Hilt, RN 01/19/2019, 9:50 AM

## 2019-01-19 NOTE — Progress Notes (Signed)
Orthopedic Tech Progress Note Patient Details:  Linsy Baldassari [birth year] [id number] RN called stating patient TLSO brace is not fitting correctly. Called up to [facility] to have someone go out and make adjustments to TLSO. Patient ID: Reis Fesmire, female   DOB: 02/05/1934, 84 y.o.   MRN: [id number]   Janit Pagan 01/19/2019, 11:23 AM

## 2019-01-19 NOTE — Progress Notes (Signed)
Merry Proud ortho tech here from [employer] .

## 2019-01-19 NOTE — Progress Notes (Signed)
Discharged home with discharge instructions, removed IV from LFA., belongings sent , w/c out to personal vehicle with husband

## 2019-01-19 NOTE — Progress Notes (Signed)
Occupational Therapy Treatment Patient Details Name: Rome Bevans MRN: [id number] DOB: 1934-07-26 Today's Date: 01/19/2019    History of present illness Per MD H&P: Pt is an 84 y.o. female with medical history significant of COPD, hypertension and arthritis came to ED with complaint of worsening back pain after having a mechanical fall on Christmas morning.  Denies any dizziness or lightheadedness.  Denies any chest pain, shortness of breath or palpitations.  Denies any exertional dyspnea.  She never lost consciousness.  Patient had a chronic lower back pain but now she is having pain in her mid back, nonradiating, aggravating with any movement.  She rested at home for couple of days to see if there is any improvement but pain continued to get worse now interfering with her sleep.  MD assessment includes: T10 vertebral fracture (Dr. Donivan Scull with orthopedics after reviewing her images thinks she might have an older fracture), COPD, and HTN.   OT comments  Pt. required CGA walking tot he toilet using a rolling walker. Independent with toilet hygiene, modified independent clothing negotiation in standing, and hand hygiene at the sink in standing. Pt. education was provided about A/E use for ADLs, Pt. Performed LE dressing with A/E use with minA. Pt. education was provided about daily routines, and tasks at home. Pt. Reports concern that the brace is not helping her, and refused to wear it. Pt. Also expressed concern about not being charged for the brace as pt. reports she is not going to use it. Nursing was notified of pt.'s concern. Pt. Continues to benefit from OT services for ADL training, continued A/E training, and pt. Education about home modification, and DME.   Follow Up Recommendations  No OT follow up    Equipment Recommendations       Recommendations for Other Services      Precautions / Restrictions Precautions Precautions: Fall Precaution Comments: No orders for TLSO but per MD  note: orthopedic MD did not think that the brace would help but stated pt may try it to see if it helps Restrictions Weight Bearing Restrictions: No       Mobility Bed Mobility Overal bed mobility: Needs Assistance Bed Mobility: Rolling;Sit to Sidelying;Sidelying to Sit Rolling: Supervision Sidelying to sit: Supervision     Sit to sidelying: Min guard General bed mobility comments: Cues for using log rolling technique.  Transfers Overall transfer level: Needs assistance     Sit to Stand: Min guard              Balance                                           ADL either performed or assessed with clinical judgement   ADL Overall ADL's : Needs assistance/impaired Eating/Feeding: Independent;Set up   Grooming: Modified independent;Set up;Standing   Upper Body Bathing: Independent;Set up   Lower Body Bathing: Minimal assistance;Set up   Upper Body Dressing : Independent;Set up   Lower Body Dressing: Minimal assistance   Toilet Transfer: Min guard;BSC   Toileting- Clothing Manipulation and Hygiene: Modified independent               Vision Patient Visual Report: No change from baseline     Perception     Praxis      Cognition Arousal/Alertness: Awake/alert Behavior During Therapy: WFL for tasks assessed/performed Overall Cognitive Status: Within Functional Limits for tasks  assessed                                          Exercises     Shoulder Instructions       General Comments      Pertinent Vitals/ Pain       Pain Assessment: 0-10 Pain Score: 7  Pain Location: back Pain Descriptors / Indicators: Aching;Sore  Home Living                                          Prior Functioning/Environment              Frequency  Min 1X/week        Progress Toward Goals  OT Goals(current goals can now be found in the care plan section)  Progress towards OT goals: Progressing  toward goals  Acute Rehab OT Goals Patient Stated Goal: to regain independence w/o pain OT Goal Formulation: With patient Time For Goal Achievement: 02/01/19 Potential to Achieve Goals: Good  Plan Discharge plan remains appropriate    Co-evaluation                 AM-PAC OT "6 Clicks" Daily Activity     Outcome Measure   Help from another person eating meals?: None Help from another person taking care of personal grooming?: None Help from another person toileting, which includes using toliet, bedpan, or urinal?: A Little Help from another person bathing (including washing, rinsing, drying)?: A Little Help from another person to put on and taking off regular upper body clothing?: None Help from another person to put on and taking off regular lower body clothing?: A Little 6 Click Score: 21    End of Session Equipment Utilized During Treatment: Gait belt  OT Visit Diagnosis: Other abnormalities of gait and mobility (R26.89);History of falling (Z91.81);Pain Pain - part of body: Hip   Activity Tolerance Patient tolerated treatment well   Patient Left in chair;with call bell/phone within reach;with chair alarm set   Nurse Communication          Time: [id number] OT Time Calculation (min): 25 min  Charges: OT General Charges $OT Visit: 1 Visit OT Treatments $Self Care/Home Management : 23-37 mins  Harrel Carina, MS, OTR/L  Harrel Carina 01/19/2019, 11:22 AM

## 2019-03-04 ENCOUNTER — Ambulatory Visit: Payer: Medicare Other | Admitting: Student in an Organized Health Care Education/Training Program

## 2019-03-11 ENCOUNTER — Ambulatory Visit: Payer: Medicare Other | Admitting: Oncology

## 2019-03-16 ENCOUNTER — Inpatient Hospital Stay: Payer: Medicare Other | Attending: Oncology | Admitting: Oncology

## 2019-03-16 ENCOUNTER — Inpatient Hospital Stay: Payer: Medicare Other

## 2019-03-16 ENCOUNTER — Encounter: Payer: Self-pay | Admitting: Oncology

## 2019-03-16 ENCOUNTER — Other Ambulatory Visit: Payer: Self-pay

## 2019-03-16 VITALS — BP 134/75 | HR 77 | Temp 98.0°F | Resp 16 | Wt 126.5 lb

## 2019-03-16 DIAGNOSIS — Z1231 Encounter for screening mammogram for malignant neoplasm of breast: Secondary | ICD-10-CM | POA: Diagnosis not present

## 2019-03-16 DIAGNOSIS — F418 Other specified anxiety disorders: Secondary | ICD-10-CM | POA: Insufficient documentation

## 2019-03-16 DIAGNOSIS — Z87891 Personal history of nicotine dependence: Secondary | ICD-10-CM | POA: Diagnosis not present

## 2019-03-16 DIAGNOSIS — Z853 Personal history of malignant neoplasm of breast: Secondary | ICD-10-CM | POA: Insufficient documentation

## 2019-03-16 DIAGNOSIS — Z79899 Other long term (current) drug therapy: Secondary | ICD-10-CM | POA: Insufficient documentation

## 2019-03-16 DIAGNOSIS — Z923 Personal history of irradiation: Secondary | ICD-10-CM | POA: Diagnosis not present

## 2019-03-16 DIAGNOSIS — Z08 Encounter for follow-up examination after completed treatment for malignant neoplasm: Secondary | ICD-10-CM | POA: Diagnosis not present

## 2019-03-16 DIAGNOSIS — D649 Anemia, unspecified: Secondary | ICD-10-CM

## 2019-03-16 DIAGNOSIS — J449 Chronic obstructive pulmonary disease, unspecified: Secondary | ICD-10-CM | POA: Insufficient documentation

## 2019-03-16 LAB — CBC WITH DIFFERENTIAL/PLATELET
Abs Immature Granulocytes: 0.06 10*3/uL (ref 0.00–0.07)
Basophils Absolute: 0.1 10*3/uL (ref 0.0–0.1)
Basophils Relative: 1 %
Eosinophils Absolute: 0.1 10*3/uL (ref 0.0–0.5)
Eosinophils Relative: 1 %
HCT: 39 % (ref 36.0–46.0)
Hemoglobin: 12.1 g/dL (ref 12.0–15.0)
Immature Granulocytes: 1 %
Lymphocytes Relative: 21 %
Lymphs Abs: 1.4 10*3/uL (ref 0.7–4.0)
MCH: 31 pg (ref 26.0–34.0)
MCHC: 31 g/dL (ref 30.0–36.0)
MCV: 100 fL (ref 80.0–100.0)
Monocytes Absolute: 0.6 10*3/uL (ref 0.1–1.0)
Monocytes Relative: 9 %
Neutro Abs: 4.4 10*3/uL (ref 1.7–7.7)
Neutrophils Relative %: 67 %
Platelets: 233 10*3/uL (ref 150–400)
RBC: 3.9 MIL/uL (ref 3.87–5.11)
RDW: 13.2 % (ref 11.5–15.5)
WBC: 6.5 10*3/uL (ref 4.0–10.5)
nRBC: 0 % (ref 0.0–0.2)

## 2019-03-16 LAB — RETICULOCYTES
Immature Retic Fract: 5.8 % (ref 2.3–15.9)
RBC.: 3.91 MIL/uL (ref 3.87–5.11)
Retic Count, Absolute: 61.4 10*3/uL (ref 19.0–186.0)
Retic Ct Pct: 1.6 % (ref 0.4–3.1)

## 2019-03-16 LAB — FERRITIN: Ferritin: 32 ng/mL (ref 11–307)

## 2019-03-16 LAB — FOLATE: Folate: 41 ng/mL (ref >5.9)

## 2019-03-16 LAB — IRON AND TIBC
Iron: 65 ug/dL (ref 28–170)
Saturation Ratios: 18 % (ref 10.4–31.8)
TIBC: 364 ug/dL (ref 250–450)
UIBC: 299 ug/dL

## 2019-03-16 LAB — VITAMIN B12: Vitamin B-12: 2941 pg/mL — ABNORMAL HIGH (ref 180–914)

## 2019-03-16 NOTE — Progress Notes (Signed)
Pt states she is doing great and no concerns

## 2019-03-19 NOTE — Progress Notes (Signed)
Hematology/Oncology Consult note [facility]  Telephone:([phone] [phone]  Patient Care Team: Dion Body, MD as PCP - General (Family Medicine)   Name of the patient: Julie Mckinney  [id number]  Aug 17, 1934   Date of visit: 03/19/19  Diagnosis-history of breast cancer s/p 5 years of tamoxifen  Chief complaint/ Reason for visit-routine follow-up of breast cancer  Heme/Onc history: 1. Patient is a 85 year old female with a history of invasive lobular carcinoma of the right breast diagnosed in 2015 lumpectomy and adjuvant radiation therapy. She was found to have a 6 x 5 x 10 mm mass in her right breast on core biopsy that was ER/PR positive and HER-2/neu negative. She underwent lumpectomy and sentinel lymph node biopsy in November 2015 which showed T2 N0 invasive lobular carcinoma measuring 2.6 cm, low-grade. 0 out of 4 sentinel lymph nodes were involved. Focally positive margin with involvement of the pectoralis muscle alone. Reexcision was not pursued and she received radiation boost to this area. She was subsequently started on hormone therapy with letrozole but had problems tolerating it due to myalgias and arthralgias and is currently on tamoxifen. She has had osteopenia in the past and has had nontraumatic bilateral were fractures despite taking Fosamax.She was then switched to tamoxifen and completed 5 years  Interval history-she reports feeling well overall and denies any complaints at this time.  Appetite and weight are stable.  ECOG PS- 1 Pain scale- 0   Review of systems- Review of Systems  Constitutional: Negative for chills, fever, malaise/fatigue and weight loss.  HENT: Negative for congestion, ear discharge and nosebleeds.   Eyes: Negative for blurred vision.  Respiratory: Negative for cough, hemoptysis, sputum production, shortness of breath and wheezing.   Cardiovascular: Negative for chest pain, palpitations, orthopnea  and claudication.  Gastrointestinal: Negative for abdominal pain, blood in stool, constipation, diarrhea, heartburn, melena, nausea and vomiting.  Genitourinary: Negative for dysuria, flank pain, frequency, hematuria and urgency.  Musculoskeletal: Negative for back pain, joint pain and myalgias.  Skin: Negative for rash.  Neurological: Negative for dizziness, tingling, focal weakness, seizures, weakness and headaches.  Endo/Heme/Allergies: Does not bruise/bleed easily.  Psychiatric/Behavioral: Negative for depression and suicidal ideas. The patient does not have insomnia.       Allergies  Allergen Reactions  . Tape Rash     Past Medical History:  Diagnosis Date  . Anxiety   . Asthma   . Breast cancer ([place]) Right  . COPD (chronic obstructive pulmonary disease) ([place])   . Depression   . Personal history of radiation therapy      Past Surgical History:  Procedure Laterality Date  . ABDOMINAL HYSTERECTOMY    . APPENDECTOMY    . BACK SURGERY    . BREAST BIOPSY Right 2015   +  . BREAST EXCISIONAL BIOPSY Right 1975   neg  . BREAST LUMPECTOMY  2015  . CHOLECYSTECTOMY    . COLONOSCOPY     Scheduled for July 2018  . COLONOSCOPY WITH PROPOFOL N/A 08/14/2016   Procedure: COLONOSCOPY WITH PROPOFOL;  Surgeon: Manya Silvas, MD;  Location: [facility] ENDOSCOPY;  Service: Endoscopy;  Laterality: N/A;  . ESOPHAGOGASTRODUODENOSCOPY (EGD) WITH PROPOFOL N/A 08/14/2016   Procedure: ESOPHAGOGASTRODUODENOSCOPY (EGD) WITH PROPOFOL;  Surgeon: Manya Silvas, MD;  Location: [facility] ENDOSCOPY;  Service: Endoscopy;  Laterality: N/A;    Social History   Socioeconomic History  . Marital status: Married    Spouse name: ronald  . Number of children: 2  .  Years of education: Not on file  . Highest education level: Some college, no degree  Occupational History  . Not on file  Tobacco Use  . Smoking status: Former Smoker    Packs/day: 0.50    Years: 30.00    Pack years: 15.00    Types:  Cigarettes    Quit date: 06/22/1995    Years since quitting: 23.7  . Smokeless tobacco: Never Used  Substance and Sexual Activity  . Alcohol use: Yes    Alcohol/week: 1.0 standard drinks    Types: 1 Glasses of wine per week    Comment: Occasional  . Drug use: No  . Sexual activity: Not Currently  Other Topics Concern  . Not on file  Social History Narrative  . Not on file   Social Determinants of Health   Financial Resource Strain:   . Difficulty of Paying Living Expenses: Not on file  Food Insecurity:   . Worried About Charity fundraiser in the Last Year: Not on file  . Ran Out of Food in the Last Year: Not on file  Transportation Needs:   . Lack of Transportation (Medical): Not on file  . Lack of Transportation (Non-Medical): Not on file  Physical Activity:   . Days of Exercise per Week: Not on file  . Minutes of Exercise per Session: Not on file  Stress:   . Feeling of Stress : Not on file  Social Connections:   . Frequency of Communication with Friends and Family: Not on file  . Frequency of Social Gatherings with Friends and Family: Not on file  . Attends Religious Services: Not on file  . Active Member of Clubs or Organizations: Not on file  . Attends Archivist Meetings: Not on file  . Marital Status: Not on file  Intimate Partner Violence:   . Fear of Current or Ex-Partner: Not on file  . Emotionally Abused: Not on file  . Physically Abused: Not on file  . Sexually Abused: Not on file    Family History  Problem Relation Age of Onset  . Cancer Father   . Breast cancer Neg Hx      Current Outpatient Medications:  .  albuterol (PROVENTIL HFA;VENTOLIN HFA) 108 (90 Base) MCG/ACT inhaler, Inhale 2 puffs into the lungs every 6 (six) hours as needed., Disp: , Rfl:  .  amLODipine (NORVASC) 2.5 MG tablet, Take 2.5 mg by mouth daily., Disp: , Rfl:  .  ARIPiprazole (ABILIFY) 2 MG tablet, Take 2 mg by mouth daily., Disp: , Rfl:  .  Azelastine-Fluticasone  137-50 MCG/ACT SUSP, Place 1 spray into the nose daily. , Disp: , Rfl:  .  calcium carbonate (CALCIUM 600) 600 MG TABS tablet, Take 600 mg by mouth 2 (two) times daily., Disp: , Rfl:  .  Cholecalciferol (VITAMIN D3) 1000 units CAPS, Take 1,000 mg by mouth daily., Disp: , Rfl:  .  citalopram (CELEXA) 20 MG tablet, Take 1.5 tablets (30 mg total) by mouth daily., Disp: 135 tablet, Rfl: 1 .  clobetasol cream (TEMOVATE) 3.41 %, Apply 1 application topically as needed. , Disp: , Rfl:  .  clotrimazole-betamethasone (LOTRISONE) cream, Apply 1 application topically 2 (two) times daily as needed. , Disp: , Rfl:  .  diclofenac (VOLTAREN) 75 MG EC tablet, Take 75 mg by mouth 2 (two) times daily., Disp: , Rfl:  .  fluticasone (FLONASE) 50 MCG/ACT nasal spray, Place 1 spray into the nose as needed., Disp: , Rfl:  .  GLUCOSAMINE SULFATE PO, Take 1 capsule by mouth 2 (two) times daily., Disp: , Rfl:  .  hydrOXYzine (ATARAX/VISTARIL) 25 MG tablet, Take 1 tablet (25 mg total) by mouth daily as needed for anxiety. (Patient taking differently: Take 25 mg by mouth every 8 (eight) hours as needed for anxiety. ), Disp: 90 tablet, Rfl: 1 .  levothyroxine (SYNTHROID, LEVOTHROID) 50 MCG tablet, Take 50 mcg by mouth daily., Disp: , Rfl:  .  Melatonin 5 MG TABS, Take 1 tablet by mouth as needed. , Disp: , Rfl:  .  mirabegron ER (MYRBETRIQ) 25 MG TB24 tablet, Take by mouth., Disp: , Rfl:  .  mirabegron ER (MYRBETRIQ) 50 MG TB24 tablet, Take by mouth., Disp: , Rfl:  .  mirtazapine (REMERON) 15 MG tablet, Take 1 tablet (15 mg total) by mouth at bedtime. For sleep and mood, Disp: 90 tablet, Rfl: 1 .  Multiple Vitamin (MULTI-VITAMINS) TABS, Take 1 tablet by mouth daily., Disp: , Rfl:  .  omeprazole (PRILOSEC) 20 MG capsule, Take 20 mg by mouth daily., Disp: , Rfl:  .  Potassium 99 MG TABS, Take 1 tablet by mouth daily. , Disp: , Rfl:  .  simvastatin (ZOCOR) 80 MG tablet, Take 80 mg by mouth daily., Disp: , Rfl:  .  vitamin B-12  (CYANOCOBALAMIN) 1000 MCG tablet, Take 1,000 mcg by mouth daily., Disp: , Rfl:  .  fluconazole (DIFLUCAN) 150 MG tablet, Take 150 mg by mouth once. , Disp: , Rfl:  .  Fluticasone Furoate-Vilanterol (BREO ELLIPTA IN), Inhale 1 puff into the lungs daily., Disp: , Rfl:  .  tiotropium (SPIRIVA) 18 MCG inhalation capsule, Place 1 capsule into inhaler and inhale as needed., Disp: , Rfl:   Physical exam:  Vitals:   03/16/19 1429  BP: 134/75  Pulse: 77  Resp: 16  Temp: 98 F (36.7 C)  Weight: 126 lb 8 oz (57.4 kg)   Physical Exam Constitutional:      General: She is not in acute distress. HENT:     Head: Normocephalic and atraumatic.  Eyes:     Pupils: Pupils are equal, round, and reactive to light.  Cardiovascular:     Rate and Rhythm: Normal rate and regular rhythm.     Heart sounds: Normal heart sounds.  Pulmonary:     Effort: Pulmonary effort is normal.     Breath sounds: Normal breath sounds.  Abdominal:     General: Bowel sounds are normal.     Palpations: Abdomen is soft.  Musculoskeletal:     Cervical back: Normal range of motion.  Skin:    General: Skin is warm and dry.  Neurological:     Mental Status: She is alert and oriented to person, place, and time.    Breast exam was performed in seated and lying down position. Patient is status post right lumpectomy with a well-healed surgical scar. No evidence of any palpable masses. No evidence of axillary adenopathy. No evidence of any palpable masses or lumps in the left breast. No evidence of leftt axillary adenopathy   CMP Latest Ref Rng & Units 01/19/2019  Glucose 70 - 99 mg/dL 104(H)  BUN 8 - 23 mg/dL 27(H)  Creatinine 0.44 - 1.00 mg/dL 0.56  Sodium 135 - 145 mmol/L 142  Potassium 3.5 - 5.1 mmol/L 4.0  Chloride 98 - 111 mmol/L 111  CO2 22 - 32 mmol/L 22  Calcium 8.9 - 10.3 mg/dL 8.5(L)  Total Protein 6.5 - 8.1 g/dL -  Total Bilirubin  0.3 - 1.2 mg/dL -  Alkaline Phos 38 - 126 U/L -  AST 15 - 41 U/L -  ALT 0 - 44  U/L -   CBC Latest Ref Rng & Units 03/16/2019  WBC 4.0 - 10.5 K/uL 6.5  Hemoglobin 12.0 - 15.0 g/dL 12.1  Hematocrit 36.0 - 46.0 % 39.0  Platelets 150 - 400 K/uL 233     Assessment and plan- Patient is a 85 y.o. female with stage I right breast cancer invasive lobular carcinoma s/p lumpectomy adjuvant radiation treatment and 5 years of tamoxifen.  This is a routine follow-up visit for breast cancer  Clinically patient is doing well and no concerning signs and symptoms of recurrence based on today's exam.  She is already completed 5 years of tamoxifen.  She will be due for a mammogram in August 2021 which I will order.   I will see her back in 1 year. Patient's recent CBC from December 2020 showed that she was anemic with a hemoglobin of 10.7.  I will therefore be checking a CBC later and iron studies B12 and folate today   Visit Diagnosis 1. Normocytic anemia   2. Encounter for follow-up surveillance of breast cancer   3. Personal history of malignant neoplasm of breast   4. Visit for screening mammogram      Dr. Randa Evens, MD, MPH [facility] [phone] 03/19/2019 10:14 AM

## 2019-04-29 ENCOUNTER — Other Ambulatory Visit: Payer: Self-pay

## 2019-04-29 ENCOUNTER — Ambulatory Visit: Payer: Medicare Other | Admitting: Psychiatry

## 2019-05-10 ENCOUNTER — Inpatient Hospital Stay: Payer: Medicare Other | Attending: Oncology | Admitting: Oncology

## 2019-05-10 ENCOUNTER — Encounter: Payer: Self-pay | Admitting: Oncology

## 2019-05-10 ENCOUNTER — Telehealth: Payer: Self-pay | Admitting: *Deleted

## 2019-05-10 ENCOUNTER — Other Ambulatory Visit: Payer: Self-pay

## 2019-05-10 VITALS — BP 152/81 | HR 79 | Temp 98.1°F | Resp 16 | Wt 122.0 lb

## 2019-05-10 DIAGNOSIS — Z853 Personal history of malignant neoplasm of breast: Secondary | ICD-10-CM | POA: Insufficient documentation

## 2019-05-10 DIAGNOSIS — Z923 Personal history of irradiation: Secondary | ICD-10-CM | POA: Diagnosis not present

## 2019-05-10 DIAGNOSIS — J449 Chronic obstructive pulmonary disease, unspecified: Secondary | ICD-10-CM | POA: Insufficient documentation

## 2019-05-10 DIAGNOSIS — Z87891 Personal history of nicotine dependence: Secondary | ICD-10-CM | POA: Insufficient documentation

## 2019-05-10 DIAGNOSIS — M8580 Other specified disorders of bone density and structure, unspecified site: Secondary | ICD-10-CM | POA: Diagnosis not present

## 2019-05-10 DIAGNOSIS — F418 Other specified anxiety disorders: Secondary | ICD-10-CM | POA: Diagnosis not present

## 2019-05-10 DIAGNOSIS — N644 Mastodynia: Secondary | ICD-10-CM | POA: Diagnosis not present

## 2019-05-10 DIAGNOSIS — Z79899 Other long term (current) drug therapy: Secondary | ICD-10-CM | POA: Diagnosis not present

## 2019-05-10 NOTE — Telephone Encounter (Signed)
1.30 pm today or 2.30 pm tomorrow.

## 2019-05-10 NOTE — Progress Notes (Signed)
Pt states that she fell last Thursday in the hallway at her house- she was using walker. She hit her leg and under arm and breast on right side. She has a knot under her right breast and it sends shooting pains to the breast all the time

## 2019-05-10 NOTE — Telephone Encounter (Signed)
Patient accepts appointment for today at 130 PM

## 2019-05-10 NOTE — Telephone Encounter (Signed)
Patient called and states that she fell and hit her breast and that it has a lump in it now. she wants to see the doctor. Please advise

## 2019-05-13 NOTE — Progress Notes (Signed)
Hematology/Oncology Consult note [facility]  Telephone:([phone] [phone]  Patient Care Team: Dion Body, MD as PCP - General (Family Medicine)   Name of the patient: Julie Mckinney  [id number]  [birth year]   Date of visit: 05/13/19  Diagnosis- history of breast cancer s/p 5 years of tamoxifen  Chief complaint/ Reason for visit- acute visit for right breast swelling following fall  Heme/Onc history: 1. Patient is a 85 year old female with a history of invasive lobular carcinoma of the right breast diagnosed in 2015 lumpectomy and adjuvant radiation therapy. She was found to have a 6 x 5 x 10 mm mass in her right breast on core biopsy that was ER/PR positive and HER-2/neu negative. She underwent lumpectomy and sentinel lymph node biopsy in November 2015 which showed T2 N0 invasive lobular carcinoma measuring 2.6 cm, low-grade. 0 out of 4 sentinel lymph nodes were involved. Focally positive margin with involvement of the pectoralis muscle alone. Reexcision was not pursued and she received radiation boost to this area. She was subsequently started on hormone therapy with letrozole but had problems tolerating it due to myalgias and arthralgias and is currently on tamoxifen. She has had osteopenia in the past and has had nontraumatic bilateral were fractures despite taking Fosamax.She was then switched to tamoxifen and completed 5 years  Interval history- patient tripped over her carpet and fell hitting her right chest wall/ breast area. Did not hit her head  ECOG PS- 1 Pain scale- 0   Review of systems- Review of Systems  Constitutional: Negative for chills, fever, malaise/fatigue and weight loss.  HENT: Negative for congestion, ear discharge and nosebleeds.   Eyes: Negative for blurred vision.  Respiratory: Negative for cough, hemoptysis, sputum production, shortness of breath and wheezing.   Cardiovascular: Negative for chest pain,  palpitations, orthopnea and claudication.  Gastrointestinal: Negative for abdominal pain, blood in stool, constipation, diarrhea, heartburn, melena, nausea and vomiting.  Genitourinary: Negative for dysuria, flank pain, frequency, hematuria and urgency.  Musculoskeletal: Negative for back pain, joint pain and myalgias.  Skin: Negative for rash.  Neurological: Negative for dizziness, tingling, focal weakness, seizures, weakness and headaches.  Endo/Heme/Allergies: Does not bruise/bleed easily.  Psychiatric/Behavioral: Negative for depression and suicidal ideas. The patient does not have insomnia.       Allergies  Allergen Reactions  . Tape Rash     Past Medical History:  Diagnosis Date  . Anxiety   . Asthma   . Breast cancer ([place]) Right  . COPD (chronic obstructive pulmonary disease) ([place])   . Depression   . Personal history of radiation therapy      Past Surgical History:  Procedure Laterality Date  . ABDOMINAL HYSTERECTOMY    . APPENDECTOMY    . BACK SURGERY    . BREAST BIOPSY Right 2015   +  . BREAST EXCISIONAL BIOPSY Right 1975   neg  . BREAST LUMPECTOMY  2015  . CHOLECYSTECTOMY    . COLONOSCOPY     Scheduled for July 2018  . COLONOSCOPY WITH PROPOFOL N/A 08/14/2016   Procedure: COLONOSCOPY WITH PROPOFOL;  Surgeon: Manya Silvas, MD;  Location: [facility] ENDOSCOPY;  Service: Endoscopy;  Laterality: N/A;  . ESOPHAGOGASTRODUODENOSCOPY (EGD) WITH PROPOFOL N/A 08/14/2016   Procedure: ESOPHAGOGASTRODUODENOSCOPY (EGD) WITH PROPOFOL;  Surgeon: Manya Silvas, MD;  Location: [facility] ENDOSCOPY;  Service: Endoscopy;  Laterality: N/A;    Social History   Socioeconomic History  . Marital status: Married    Spouse name: ronald  .  Number of children: 2  . Years of education: Not on file  . Highest education level: Some college, no degree  Occupational History  . Not on file  Tobacco Use  . Smoking status: Former Smoker    Packs/day: 0.50    Years: 30.00    Pack years:  15.00    Types: Cigarettes    Quit date: 06/22/1995    Years since quitting: 23.9  . Smokeless tobacco: Never Used  Substance and Sexual Activity  . Alcohol use: Yes    Alcohol/week: 1.0 standard drinks    Types: 1 Glasses of wine per week    Comment: Occasional  . Drug use: No  . Sexual activity: Not Currently  Other Topics Concern  . Not on file  Social History Narrative  . Not on file   Social Determinants of Health   Financial Resource Strain:   . Difficulty of Paying Living Expenses:   Food Insecurity:   . Worried About Charity fundraiser in the Last Year:   . Arboriculturist in the Last Year:   Transportation Needs:   . Film/video editor (Medical):   Marland Kitchen Lack of Transportation (Non-Medical):   Physical Activity:   . Days of Exercise per Week:   . Minutes of Exercise per Session:   Stress:   . Feeling of Stress :   Social Connections:   . Frequency of Communication with Friends and Family:   . Frequency of Social Gatherings with Friends and Family:   . Attends Religious Services:   . Active Member of Clubs or Organizations:   . Attends Archivist Meetings:   Marland Kitchen Marital Status:   Intimate Partner Violence:   . Fear of Current or Ex-Partner:   . Emotionally Abused:   Marland Kitchen Physically Abused:   . Sexually Abused:     Family History  Problem Relation Age of Onset  . Cancer Father   . Breast cancer Neg Hx      Current Outpatient Medications:  .  albuterol (PROVENTIL HFA;VENTOLIN HFA) 108 (90 Base) MCG/ACT inhaler, Inhale 2 puffs into the lungs every 6 (six) hours as needed., Disp: , Rfl:  .  amLODipine (NORVASC) 2.5 MG tablet, Take 2.5 mg by mouth daily., Disp: , Rfl:  .  ARIPiprazole (ABILIFY) 2 MG tablet, Take 2 mg by mouth daily., Disp: , Rfl:  .  Azelastine-Fluticasone 137-50 MCG/ACT SUSP, Place 1 spray into the nose daily. , Disp: , Rfl:  .  calcium carbonate (CALCIUM 600) 600 MG TABS tablet, Take 600 mg by mouth 2 (two) times daily., Disp: , Rfl:   .  Cholecalciferol (VITAMIN D3) 1000 units CAPS, Take 1,000 mg by mouth daily., Disp: , Rfl:  .  citalopram (CELEXA) 20 MG tablet, Take 1.5 tablets (30 mg total) by mouth daily., Disp: 135 tablet, Rfl: 1 .  clobetasol cream (TEMOVATE) 2.35 %, Apply 1 application topically as needed. , Disp: , Rfl:  .  clotrimazole-betamethasone (LOTRISONE) cream, Apply 1 application topically 2 (two) times daily as needed. , Disp: , Rfl:  .  diclofenac (VOLTAREN) 75 MG EC tablet, Take 75 mg by mouth 2 (two) times daily., Disp: , Rfl:  .  fluconazole (DIFLUCAN) 150 MG tablet, Take 150 mg by mouth once. , Disp: , Rfl:  .  fluticasone (FLONASE) 50 MCG/ACT nasal spray, Place 1 spray into the nose as needed., Disp: , Rfl:  .  Fluticasone-Umeclidin-Vilant (TRELEGY ELLIPTA) 100-62.5-25 MCG/INH AEPB, Inhale 1 puff into the  lungs daily., Disp: , Rfl:  .  GLUCOSAMINE SULFATE PO, Take 1 capsule by mouth 2 (two) times daily., Disp: , Rfl:  .  hydrOXYzine (ATARAX/VISTARIL) 25 MG tablet, Take 1 tablet (25 mg total) by mouth daily as needed for anxiety. (Patient taking differently: Take 25 mg by mouth every 8 (eight) hours as needed for anxiety. ), Disp: 90 tablet, Rfl: 1 .  levothyroxine (SYNTHROID, LEVOTHROID) 50 MCG tablet, Take 50 mcg by mouth daily., Disp: , Rfl:  .  Melatonin 5 MG TABS, Take 1 tablet by mouth as needed. , Disp: , Rfl:  .  mirabegron ER (MYRBETRIQ) 25 MG TB24 tablet, Take by mouth., Disp: , Rfl:  .  mirabegron ER (MYRBETRIQ) 50 MG TB24 tablet, Take by mouth., Disp: , Rfl:  .  mirtazapine (REMERON) 15 MG tablet, Take 1 tablet (15 mg total) by mouth at bedtime. For sleep and mood, Disp: 90 tablet, Rfl: 1 .  Multiple Vitamin (MULTI-VITAMINS) TABS, Take 1 tablet by mouth daily., Disp: , Rfl:  .  omeprazole (PRILOSEC) 20 MG capsule, Take 20 mg by mouth daily., Disp: , Rfl:  .  Potassium 99 MG TABS, Take 1 tablet by mouth daily. , Disp: , Rfl:  .  simvastatin (ZOCOR) 80 MG tablet, Take 80 mg by mouth daily.,  Disp: , Rfl:  .  vitamin B-12 (CYANOCOBALAMIN) 1000 MCG tablet, Take 1,000 mcg by mouth daily., Disp: , Rfl:   Physical exam:  Vitals:   05/10/19 1339  BP: (!) 152/81  Pulse: 79  Resp: 16  Temp: 98.1 F (36.7 C)  TempSrc: Tympanic  Weight: 122 lb (55.3 kg)   Physical Exam Constitutional:      General: She is not in acute distress. Cardiovascular:     Rate and Rhythm: Normal rate and regular rhythm.     Heart sounds: Normal heart sounds.  Pulmonary:     Effort: Pulmonary effort is normal.     Breath sounds: Normal breath sounds.  Abdominal:     General: Bowel sounds are normal.     Palpations: Abdomen is soft.  Skin:    General: Skin is warm and dry.  Neurological:     Mental Status: She is alert and oriented to person, place, and time.   There is mild local warmth and swelling noted in the right breast around the 7 o'clock position without a distinct palpable mass.  CMP Latest Ref Rng & Units 01/19/2019  Glucose 70 - 99 mg/dL 104(H)  BUN 8 - 23 mg/dL 27(H)  Creatinine 0.44 - 1.00 mg/dL 0.56  Sodium 135 - 145 mmol/L 142  Potassium 3.5 - 5.1 mmol/L 4.0  Chloride 98 - 111 mmol/L 111  CO2 22 - 32 mmol/L 22  Calcium 8.9 - 10.3 mg/dL 8.5(L)  Total Protein 6.5 - 8.1 g/dL -  Total Bilirubin 0.3 - 1.2 mg/dL -  Alkaline Phos 38 - 126 U/L -  AST 15 - 41 U/L -  ALT 0 - 44 U/L -   CBC Latest Ref Rng & Units 03/16/2019  WBC 4.0 - 10.5 K/uL 6.5  Hemoglobin 12.0 - 15.0 g/dL 12.1  Hematocrit 36.0 - 46.0 % 39.0  Platelets 150 - 400 K/uL 233    Assessment and plan- Patient is a 85 y.o. female with stage I right breast cancer invasive lobular carcinoma s/p lumpectomy adjuvant radiation treatment and 5 years of tamoxifen.  This is an acute visit for evaluation of right breast pain following a fall   Based on  my examination today I suspect the patient has a small breast hematoma at the site of her prior surgery.  I have encouraged the patient to continue warm compresses along with  anti-inflammatory such as Tylenol.  I suspect this hematoma should resolve on its own over the next couple of weeks.  However if her pain worsens or she feels that the swelling is getting worse she should get in touch with [place] I will arrange an ultrasound at that time  Patient has completed 5 years of tamoxifen treatment for breast cancer and I will see her on a yearly basis and we will see her back in February 2022 but sooner if she has any questions or concerns  Visit Diagnosis 1. Breast pain, right      Dr. Randa Evens, MD, MPH [facility] at [facility] [phone] 05/13/2019 12:05 PM

## 2019-05-17 ENCOUNTER — Other Ambulatory Visit: Payer: Self-pay

## 2019-05-17 ENCOUNTER — Encounter: Payer: Self-pay | Admitting: Podiatry

## 2019-05-17 ENCOUNTER — Ambulatory Visit (INDEPENDENT_AMBULATORY_CARE_PROVIDER_SITE_OTHER): Payer: Medicare Other | Admitting: Podiatry

## 2019-05-17 DIAGNOSIS — M2041 Other hammer toe(s) (acquired), right foot: Secondary | ICD-10-CM | POA: Insufficient documentation

## 2019-05-17 DIAGNOSIS — M79674 Pain in right toe(s): Secondary | ICD-10-CM | POA: Diagnosis not present

## 2019-05-17 DIAGNOSIS — B351 Tinea unguium: Secondary | ICD-10-CM | POA: Diagnosis not present

## 2019-05-17 DIAGNOSIS — M2042 Other hammer toe(s) (acquired), left foot: Secondary | ICD-10-CM | POA: Diagnosis not present

## 2019-05-17 DIAGNOSIS — M79675 Pain in left toe(s): Secondary | ICD-10-CM | POA: Diagnosis not present

## 2019-05-17 NOTE — Progress Notes (Signed)
This patient presents the office with chief complaint of curling toes on both feet which make keeping her balance difficult.  She says this is becoming harder and harder over time to prevent from falling.  She presents to the office today with her husband for an evaluation.  She presents the office and shows me shoes that she has been wearing but these shoes lack support and have a poor sole.  She says she does better wearing the shoes that she wore to the office and upon examination the shoes are very supportive plus have a sole that has minimal band.  She also says she is interested in having her nails looked at.  She says she is unable to trim the nails herself.  General Appearance  Alert, conversant and in no acute stress.  Vascular  Dorsalis pedis  are palpable  Bilaterally. Posterior tibial pulses are weakly palpable  B/L.  Capillary return is within normal limits  bilaterally. Temperature is within normal limits  bilaterally.  Neurologic  Senn-Weinstein monofilament wire test within normal limits left.  LOPS diminished right foot due to back surgery.  . Muscle power within normal limits bilaterally.  Nails Thick disfigured discolored nails with subungual debris  Hallux  B/L.   No evidence of bacterial infection or drainage bilaterally.  Orthopedic  No limitations of motion  feet .  No crepitus or effusions noted.  No bony pathology or digital deformities noted.  Hammer toes 2-5  B/L.  Skin  normotropic skin with no porokeratosis noted bilaterally.  No signs of infections or ulcers noted.    Hammer toes 2-5  B/L.  Onychomycosis hallux  B/l  IE.  Debride nails  B/L.  Crest pad was given to this patient to help balance her right forefoot.  Discussed possible flexor tenotomy surgery for this patient if needed.  It also appears that her chronic left foot pain is present on the shoe order leg as her left leg appears to be shorter than her right upon exam.  This patient is to wear the crest pads and  to wear her solid shoes and to stop wearing her sandals that have no support.  She was told to return to the office as needed.   Gardiner Barefoot DPM

## 2019-05-20 ENCOUNTER — Other Ambulatory Visit: Payer: Self-pay

## 2019-05-20 ENCOUNTER — Encounter: Payer: Self-pay | Admitting: Psychiatry

## 2019-05-20 ENCOUNTER — Telehealth (INDEPENDENT_AMBULATORY_CARE_PROVIDER_SITE_OTHER): Payer: Medicare Other | Admitting: Psychiatry

## 2019-05-20 DIAGNOSIS — F411 Generalized anxiety disorder: Secondary | ICD-10-CM

## 2019-05-20 DIAGNOSIS — F3342 Major depressive disorder, recurrent, in full remission: Secondary | ICD-10-CM

## 2019-05-20 DIAGNOSIS — G4701 Insomnia due to medical condition: Secondary | ICD-10-CM | POA: Diagnosis not present

## 2019-05-20 MED ORDER — CITALOPRAM HYDROBROMIDE 20 MG PO TABS: 30.0000 mg | ORAL_TABLET | Freq: Every day | ORAL | 1 refills | Status: DC

## 2019-05-20 NOTE — Progress Notes (Signed)
Provider Location : [facility] Patient Location : Home  Virtual Visit via Telephone Note  I connected with Julie Mckinney on 05/20/19 at  8:30 AM EDT by telephone and verified that I am speaking with the correct person using two identifiers.   I discussed the limitations, risks, security and privacy concerns of performing an evaluation and management service by telephone and the availability of in person appointments. I also discussed with the patient that there may be a patient responsible charge related to this service. The patient expressed understanding and agreed to proceed.      I discussed the assessment and treatment plan with the patient. The patient was provided an opportunity to ask questions and all were answered. The patient agreed with the plan and demonstrated an understanding of the instructions.   The patient was advised to call back or seek an in-person evaluation if the symptoms worsen or if the condition fails to improve as anticipated.   [place] MD OP Progress Note  05/20/2019 8:55 AM Julie Mckinney  MRN:  [id number]  Chief Complaint:  Chief Complaint    Follow-up     HPI: Julie Mckinney is an 85 year old Caucasian female, lives in a senior living community in [place], married, has a history of MDD, GAD, COPD, chronic pain, rheumatoid arthritis, history of breast cancer in remission, on tamoxifen was evaluated by phone today.  Patient reported will phone call.  She reports she is currently doing well on the current medication regimen.  She does report she had a fracture of her spine in December on Christmas Day.  She reports she is currently recovering however continues to have physical limitations due to the same.  She uses a cane or a walker to walk.  She reports her husband supports her and helps her out throughout the day.  She reports her physical limitations does make her depressed however overall she is coping okay.  She is compliant on the Celexa as prescribed.  She  denies side effects.  She reports sleep is good.  Since she started sleeping better she stopped taking the mirtazapine.  She does not believe she needs it anymore.  She denies any suicidality, homicidality or perceptual disturbances.  Patient denies any other concerns today. Visit Diagnosis:    ICD-10-CM   1. MDD (major depressive disorder), recurrent, in full remission ([place])  F33.42 citalopram (CELEXA) 20 MG tablet  2. GAD (generalized anxiety disorder)  F41.1 citalopram (CELEXA) 20 MG tablet  3. Insomnia due to medical condition  G47.01     Past Psychiatric History: I have reviewed past psychiatric history from my progress note on 10/23/2017.  Past trials of Celexa, mirtazapine, melatonin.  Past Medical History:  Past Medical History:  Diagnosis Date  . Anxiety   . Asthma   . Breast cancer ([place]) Right  . COPD (chronic obstructive pulmonary disease) ([place])   . Depression   . Personal history of radiation therapy     Past Surgical History:  Procedure Laterality Date  . ABDOMINAL HYSTERECTOMY    . APPENDECTOMY    . BACK SURGERY    . BREAST BIOPSY Right 2015   +  . BREAST EXCISIONAL BIOPSY Right 1975   neg  . BREAST LUMPECTOMY  2015  . CHOLECYSTECTOMY    . COLONOSCOPY     Scheduled for July 2018  . COLONOSCOPY WITH PROPOFOL N/A 08/14/2016   Procedure: COLONOSCOPY WITH PROPOFOL;  Surgeon: Manya Silvas, MD;  Location: [facility] ENDOSCOPY;  Service: Endoscopy;  Laterality: N/A;  .  ESOPHAGOGASTRODUODENOSCOPY (EGD) WITH PROPOFOL N/A 08/14/2016   Procedure: ESOPHAGOGASTRODUODENOSCOPY (EGD) WITH PROPOFOL;  Surgeon: Manya Silvas, MD;  Location: [facility] ENDOSCOPY;  Service: Endoscopy;  Laterality: N/A;    Family Psychiatric History: Reviewed family history from my progress note on 10/23/2017  Family History:  Family History  Problem Relation Age of Onset  . Cancer Father   . Breast cancer Neg Hx     Social History: I have reviewed social history from my progress note on  10/23/2017. Social History   Socioeconomic History  . Marital status: Married    Spouse name: ronald  . Number of children: 2  . Years of education: Not on file  . Highest education level: Some college, no degree  Occupational History  . Not on file  Tobacco Use  . Smoking status: Former Smoker    Packs/day: 0.50    Years: 30.00    Pack years: 15.00    Types: Cigarettes    Quit date: 06/22/1995    Years since quitting: 23.9  . Smokeless tobacco: Never Used  Substance and Sexual Activity  . Alcohol use: Yes    Alcohol/week: 1.0 standard drinks    Types: 1 Glasses of wine per week    Comment: Occasional  . Drug use: No  . Sexual activity: Not Currently  Other Topics Concern  . Not on file  Social History Narrative  . Not on file   Social Determinants of Health   Financial Resource Strain:   . Difficulty of Paying Living Expenses:   Food Insecurity:   . Worried About Charity fundraiser in the Last Year:   . Arboriculturist in the Last Year:   Transportation Needs:   . Film/video editor (Medical):   Marland Kitchen Lack of Transportation (Non-Medical):   Physical Activity:   . Days of Exercise per Week:   . Minutes of Exercise per Session:   Stress:   . Feeling of Stress :   Social Connections:   . Frequency of Communication with Friends and Family:   . Frequency of Social Gatherings with Friends and Family:   . Attends Religious Services:   . Active Member of Clubs or Organizations:   . Attends Archivist Meetings:   Marland Kitchen Marital Status:     Allergies:  Allergies  Allergen Reactions  . Tape Rash    Metabolic Disorder Labs: No results found for: HGBA1C, MPG No results found for: PROLACTIN No results found for: CHOL, TRIG, HDL, CHOLHDL, VLDL, LDLCALC No results found for: TSH  Therapeutic Level Labs: No results found for: LITHIUM No results found for: VALPROATE No components found for:  CBMZ  Current Medications: Current Outpatient Medications   Medication Sig Dispense Refill  . atorvastatin (LIPITOR) 40 MG tablet TAKE ONE TABLET BY MOUTH EVERY EVENING    . tiZANidine (ZANAFLEX) 2 MG tablet Take by mouth.    Marland Kitchen acetaminophen (TYLENOL) 500 MG tablet Take by mouth.    Marland Kitchen albuterol (PROVENTIL HFA;VENTOLIN HFA) 108 (90 Base) MCG/ACT inhaler Inhale 2 puffs into the lungs every 6 (six) hours as needed.    Marland Kitchen amLODipine (NORVASC) 2.5 MG tablet Take 2.5 mg by mouth daily.    . Azelastine-Fluticasone 137-50 MCG/ACT SUSP Place 1 spray into the nose daily.     . calcium carbonate (CALCIUM 600) 600 MG TABS tablet Take 600 mg by mouth 2 (two) times daily.    . Cholecalciferol (VITAMIN D3) 1000 units CAPS Take 1,000 mg by mouth daily.    Marland Kitchen  citalopram (CELEXA) 20 MG tablet Take 1.5 tablets (30 mg total) by mouth daily. 135 tablet 1  . clobetasol cream (TEMOVATE) [id number] % Apply 1 application topically as needed.     . clotrimazole-betamethasone (LOTRISONE) cream Apply 1 application topically 2 (two) times daily as needed.     . diclofenac (VOLTAREN) 75 MG EC tablet Take 75 mg by mouth 2 (two) times daily.    . fluconazole (DIFLUCAN) 150 MG tablet Take 150 mg by mouth once.     . fluticasone (FLONASE) 50 MCG/ACT nasal spray Place 1 spray into the nose as needed.    . Fluticasone-Umeclidin-Vilant (TRELEGY ELLIPTA) 100-62.5-25 MCG/INH AEPB Inhale 1 puff into the lungs daily.    Marland Kitchen GLUCOSAMINE SULFATE PO Take 1 capsule by mouth 2 (two) times daily.    . hydrOXYzine (ATARAX/VISTARIL) 25 MG tablet Take 1 tablet (25 mg total) by mouth daily as needed for anxiety. (Patient taking differently: Take 25 mg by mouth every 8 (eight) hours as needed for anxiety. ) 90 tablet 1  . levothyroxine (SYNTHROID, LEVOTHROID) 50 MCG tablet Take 50 mcg by mouth daily.    . Melatonin 5 MG TABS Take 1 tablet by mouth as needed.     . Melatonin-Pyridoxine 5-1 MG TABS Take by mouth.    . mirabegron ER (MYRBETRIQ) 25 MG TB24 tablet Take by mouth.    . mirabegron ER (MYRBETRIQ) 50 MG  TB24 tablet Take by mouth.    . Multiple Vitamin (MULTI-VITAMINS) TABS Take 1 tablet by mouth daily.    . Nebulizers MISC Use    . neomycin-polymyxin b-dexamethasone (MAXITROL) 3.5-10000-0.1 OINT SMARTSIG:1 Sparingly In Eye(s) Every Night    . omeprazole (PRILOSEC) 20 MG capsule Take 20 mg by mouth daily.    Marland Kitchen PATADAY 0.2 % SOLN     . Potassium 99 MG TABS Take 1 tablet by mouth daily.     . Potassium Gluconate 2.5 MEQ TABS Take by mouth.    . simvastatin (ZOCOR) 80 MG tablet Take 80 mg by mouth daily.    . vitamin B-12 (CYANOCOBALAMIN) 1000 MCG tablet Take 1,000 mcg by mouth daily.     No current facility-administered medications for this visit.     Musculoskeletal: Strength & Muscle Tone: UTA Gait & Station: Reports as walks with a cane or walker  Patient leans: N/A  Psychiatric Specialty Exam: Review of Systems  Musculoskeletal: Positive for back pain.  Psychiatric/Behavioral: Negative for agitation, behavioral problems, confusion, decreased concentration, dysphoric mood, hallucinations, self-injury, sleep disturbance and suicidal ideas. The patient is not nervous/anxious and is not hyperactive.   All other systems reviewed and are negative.   There were no vitals taken for this visit.There is no height or weight on file to calculate BMI.  General Appearance: UTA  Eye Contact:  UTA  Speech:  Clear and Coherent  Volume:  Normal  Mood:  Euthymic  Affect:  UTA  Thought Process:  Goal Directed and Descriptions of Associations: Intact  Orientation:  Full (Time, Place, and Person)  Thought Content: Logical   Suicidal Thoughts:  No  Homicidal Thoughts:  No  Memory:  Immediate;   Fair Recent;   Fair Remote;   Fair  Judgement:  Fair  Insight:  Fair  Psychomotor Activity:  UTA  Concentration:  Concentration: Fair and Attention Span: Fair  Recall:  [employer] of Knowledge: Fair  Language: Fair  Akathisia:  No  Handed:  Right  AIMS (if indicated): UTA  Assets:  Music therapist  Desire for Improvement Housing Social Support  ADL's:  Intact  Cognition: WNL  Sleep:  Fair   Screenings:   Assessment and Plan: Kimanh is an 85 year old female, married, lives in an independent senior living community in [place], has a history of depression, anxiety, sleep problems, COPD, chronic pain, rheumatoid arthritis, breast cancer in remission, occipital was evaluated by phone today.  Patient preferred to do a phone call.  Patient is biologically predisposed given her multiple medical problems.  She also has psychosocial stressors of her own health issues.  Patient however is currently stable on medications.  Plan as noted below.  Plan  MDD in full remission Celexa 40 mg p.o. daily Discontinue mirtazapine for noncompliance.  GAD-stable Hydroxyzine 25 mg p.o. daily as needed Celexa 30 mg p.o. daily  Insomnia-stable Patient is able to sleep without any medications.  We will monitor closely  I have reviewed her medical records in [facility] for neurosurgeon Dr. Cari Caraway dated 04/22/2019-patient had T10 burst fracture on 01/15/2019.'  Follow-up in clinic in 5 to 6 months or sooner if needed.  I have spent atleast 20 minutes non face to face with patient today. More than 50 % of the time was spent for preparing to see the patient ( e.g., review of test, records ),  ordering medications and test ,psychoeducation and supportive psychotherapy and care coordination,as well as documenting clinical information in electronic health record. This note was generated in part or whole with voice recognition software. Voice recognition is usually quite accurate but there are transcription errors that can and very often do occur. I apologize for any typographical errors that were not detected and corrected.       Ursula Alert, MD 05/20/2019, 8:55 AM

## 2019-05-25 ENCOUNTER — Other Ambulatory Visit: Payer: Self-pay | Admitting: *Deleted

## 2019-05-25 ENCOUNTER — Telehealth: Payer: Self-pay | Admitting: *Deleted

## 2019-05-25 DIAGNOSIS — N644 Mastodynia: Secondary | ICD-10-CM

## 2019-05-25 DIAGNOSIS — N6489 Other specified disorders of breast: Secondary | ICD-10-CM

## 2019-05-25 NOTE — Telephone Encounter (Signed)
Pt called and said that she still has a sore spot of the breast. It still has pains comes and goes. Dr. Janese Banks said that if it continued that she would need a u/s and per mammography she will also have . It is scheduled for May 6 at 3:20 for both u/s and mammogram and she knows to go to  [facility]. Then appt to see Dr Janese Banks on 5/7 at 2 pm.  Pt agreeable to above

## 2019-05-27 ENCOUNTER — Ambulatory Visit
Admission: RE | Admit: 2019-05-27 | Discharge: 2019-05-27 | Disposition: A | Discharge status: Home / Self Care | Payer: Medicare Other | Source: Ambulatory Visit | Attending: Oncology | Admitting: Oncology

## 2019-05-27 DIAGNOSIS — N644 Mastodynia: Secondary | ICD-10-CM

## 2019-05-27 DIAGNOSIS — N6489 Other specified disorders of breast: Secondary | ICD-10-CM | POA: Diagnosis present

## 2019-05-28 ENCOUNTER — Telehealth: Payer: Self-pay | Admitting: *Deleted

## 2019-05-28 ENCOUNTER — Other Ambulatory Visit: Payer: Self-pay | Admitting: Oncology

## 2019-05-28 ENCOUNTER — Inpatient Hospital Stay: Payer: Medicare Other | Admitting: Oncology

## 2019-05-28 DIAGNOSIS — R928 Other abnormal and inconclusive findings on diagnostic imaging of breast: Secondary | ICD-10-CM

## 2019-05-28 NOTE — Telephone Encounter (Signed)
Called the patient and let her know that we see that she had mammogram and that it was rec: that she get a bx. [facility] should call her with a date. Once we know what the bx date then we will schedule her for visit to come in . For today we would like to cancel the appt since she already got her mammogram results and we have a plan in place. The pt. Was happy not to have to come today. We will get in touch with her for future appts. Pt agreeable to the plan

## 2019-05-31 ENCOUNTER — Telehealth: Payer: Self-pay | Admitting: *Deleted

## 2019-05-31 NOTE — Telephone Encounter (Signed)
Patient called reporting that she as gotten an appointment for her breast biopsy and to call her for the information. Per her appointment desk, she is scheduled for 06/03/19

## 2019-05-31 NOTE — Telephone Encounter (Signed)
Called pt. And told her that now that we know the bx date we made her an appt for 5/20 at 11:30. If we gets results earlier we will notify her of that. She is agreeable to this

## 2019-06-03 ENCOUNTER — Ambulatory Visit
Admission: RE | Admit: 2019-06-03 | Discharge: 2019-06-03 | Disposition: A | Discharge status: Home / Self Care | Payer: Medicare Other | Source: Ambulatory Visit | Attending: Oncology | Admitting: Oncology

## 2019-06-03 DIAGNOSIS — R928 Other abnormal and inconclusive findings on diagnostic imaging of breast: Secondary | ICD-10-CM

## 2019-06-07 LAB — SURGICAL PATHOLOGY

## 2019-06-08 ENCOUNTER — Telehealth: Payer: Self-pay | Admitting: *Deleted

## 2019-06-08 NOTE — Telephone Encounter (Addendum)
She said that [facility] told her that she needed it, I think you will have to order it.  ADDENDUM REPORT: 06/07/2019 13:14  ADDENDUM: PATHOLOGY revealed: A. 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 - NEGATIVE FOR ATYPIA AND MALIGNANCY. - DEEPER SECTIONS EXAMINED.  Comment: The majority of the specimen consists of unremarkable mature adipose tissue. There is focal sampling of tissue with mammary ducts and lobules demonstrating usual ductal hyperplasia. Correlation with clinical impression and radiographic findings is required.  Pathology results are CONCORDANT with imaging findings, per Dr. Claudie Revering.  Pathology results and recommendations below were discussed with patient by telephone on 06/07/2019. Patient reported biopsy site doing well with slight tenderness at the site. Post biopsy care instructions were reviewed and questions were answered. Patient was instructed to call [facility] if any concerns or questions arise related to the biopsy.  Recommendation: Bilateral breast MRI to further assess the findings of concern on the recent diagnostic mammogram and ultrasound of the right breast, especially in view of the patient's history of lobular cancer in 2015.  Addendum by Electa Sniff RN on 06/07/2019.   Electronically Signed   By: Claudie Revering M.D.   On: 06/07/2019 13:14

## 2019-06-08 NOTE — Telephone Encounter (Signed)
Patient called stating that the breast center called her and told her that the biopsy was negative and that they want to do an MRI of the breast which has not been scheduled yet. She is asking if she needs to come to the appointment on the 20th or wait until after the MRI is done. Please advise

## 2019-06-08 NOTE — Telephone Encounter (Signed)
Per Bx report  Recommendation: Bilateral breast MRI to further assess the findings of concern on the recent diagnostic mammogram and ultrasound of the right breast, especially in view of the patient's history of lobular cancer in 2015.

## 2019-06-08 NOTE — Telephone Encounter (Signed)
Called mammography and got voicemail and left message to ask radiologist about whether pt should need MRI after bx results came back. MD to see pt on 5/20 and she would like to hold off on the visit if she needs a MRI.

## 2019-06-08 NOTE — Telephone Encounter (Signed)
Ok in that case lets get MRI b/l breasts in that case, add her to breast conference following that and I will see her after that

## 2019-06-08 NOTE — Telephone Encounter (Signed)
Can you find out from [facility] if she needs MRI? If so- why?. We can order it after that

## 2019-06-08 NOTE — Telephone Encounter (Signed)
Who is ordering MRI? Ok to wait until after MRI

## 2019-06-09 ENCOUNTER — Other Ambulatory Visit: Payer: Self-pay | Admitting: *Deleted

## 2019-06-09 DIAGNOSIS — R928 Other abnormal and inconclusive findings on diagnostic imaging of breast: Secondary | ICD-10-CM

## 2019-06-09 DIAGNOSIS — Z08 Encounter for follow-up examination after completed treatment for malignant neoplasm: Secondary | ICD-10-CM

## 2019-06-09 NOTE — Telephone Encounter (Signed)
I called pt home and got voicemail and said that MRI was rec; by radiologist. We have scheduled the MRI for 5/26 with appt 1 pm and [facility] . Will need to not eat or drink anything 4 hours prior to scan. The scheduler will cancel the appt for Dr. Janese Banks for 5/20 and change the appt to see pt after MRI has been performed. I have asked Heather to r/s her and call pt back with new appt.

## 2019-06-09 NOTE — Telephone Encounter (Signed)
Yeah hold off until MRI

## 2019-06-10 ENCOUNTER — Inpatient Hospital Stay: Payer: Medicare Other | Admitting: Oncology

## 2019-06-16 ENCOUNTER — Ambulatory Visit
Admission: RE | Admit: 2019-06-16 | Discharge: 2019-06-16 | Disposition: A | Discharge status: Home / Self Care | Payer: Medicare Other | Source: Ambulatory Visit | Attending: Oncology | Admitting: Oncology

## 2019-06-16 ENCOUNTER — Other Ambulatory Visit: Payer: Self-pay

## 2019-06-16 DIAGNOSIS — R928 Other abnormal and inconclusive findings on diagnostic imaging of breast: Secondary | ICD-10-CM | POA: Diagnosis present

## 2019-06-16 DIAGNOSIS — Z08 Encounter for follow-up examination after completed treatment for malignant neoplasm: Secondary | ICD-10-CM | POA: Diagnosis present

## 2019-06-16 DIAGNOSIS — Z853 Personal history of malignant neoplasm of breast: Secondary | ICD-10-CM | POA: Diagnosis present

## 2019-06-16 MED ORDER — GADOBUTROL 1 MMOL/ML IV SOLN
5.0000 mL | Freq: Once | INTRAVENOUS | Status: AC | PRN
  Administered 2019-06-16: 5 mL via INTRAVENOUS

## 2019-06-17 ENCOUNTER — Inpatient Hospital Stay: Payer: Medicare Other | Attending: Oncology | Admitting: Oncology

## 2019-06-17 ENCOUNTER — Encounter: Payer: Self-pay | Admitting: Oncology

## 2019-06-17 VITALS — BP 125/69 | HR 70 | Temp 97.8°F | Resp 18 | Wt 117.7 lb

## 2019-06-17 DIAGNOSIS — Z809 Family history of malignant neoplasm, unspecified: Secondary | ICD-10-CM | POA: Diagnosis not present

## 2019-06-17 DIAGNOSIS — M8580 Other specified disorders of bone density and structure, unspecified site: Secondary | ICD-10-CM | POA: Diagnosis not present

## 2019-06-17 DIAGNOSIS — R928 Other abnormal and inconclusive findings on diagnostic imaging of breast: Secondary | ICD-10-CM | POA: Diagnosis not present

## 2019-06-17 DIAGNOSIS — Z87891 Personal history of nicotine dependence: Secondary | ICD-10-CM | POA: Diagnosis not present

## 2019-06-17 DIAGNOSIS — Z79899 Other long term (current) drug therapy: Secondary | ICD-10-CM | POA: Insufficient documentation

## 2019-06-17 DIAGNOSIS — F418 Other specified anxiety disorders: Secondary | ICD-10-CM | POA: Insufficient documentation

## 2019-06-17 DIAGNOSIS — J449 Chronic obstructive pulmonary disease, unspecified: Secondary | ICD-10-CM | POA: Diagnosis not present

## 2019-06-17 DIAGNOSIS — Z170 Estrogen receptor positive status [ER+]: Secondary | ICD-10-CM | POA: Insufficient documentation

## 2019-06-17 DIAGNOSIS — C50511 Malignant neoplasm of lower-outer quadrant of right female breast: Secondary | ICD-10-CM | POA: Diagnosis present

## 2019-06-17 DIAGNOSIS — Z79810 Long term (current) use of selective estrogen receptor modulators (SERMs): Secondary | ICD-10-CM | POA: Insufficient documentation

## 2019-06-17 NOTE — Progress Notes (Signed)
Patient here today for follow up regarding right breast swelling and discomfort.

## 2019-06-21 NOTE — Progress Notes (Signed)
Hematology/Oncology Consult note [facility]  Telephone:([phone] [phone]  Patient Care Team: Dion Body, MD as PCP - General (Family Medicine)   Name of the patient: Julie Mckinney  [id number]  [birth year]   Date of visit: 06/21/19  Diagnosis- history of breast cancer s/p 5 years of tamoxifen  Chief complaint/ Reason for visit- -discuss MRI results and further management  Heme/Onc history: 1. Patient is a 85 year old female with a history of invasive lobular carcinoma of the right breast diagnosed in 2015 lumpectomy and adjuvant radiation therapy. She was found to have a 6 x 5 x 10 mm mass in her right breast on core biopsy that was ER/PR positive and HER-2/neu negative. She underwent lumpectomy and sentinel lymph node biopsy in November 2015 which showed T2 N0 invasive lobular carcinoma measuring 2.6 cm, low-grade. 0 out of 4 sentinel lymph nodes were involved. Focally positive margin with involvement of the pectoralis muscle alone. Reexcision was not pursued and she received radiation boost to this area. She was subsequently started on hormone therapy with letrozole but had problems tolerating it due to myalgias and arthralgias and is currently on tamoxifen. She has had osteopenia in the past and has had nontraumatic bilateral were fractures despite taking Fosamax.She was then switched to tamoxifenand completed 5 years  Interval history-discomfort in the area of the right breast has decreased however patient still feels some hardness in the area of surgery.  ECOG PS- 1 Pain scale- 0   Review of systems- Review of Systems  Constitutional: Positive for malaise/fatigue. Negative for chills, fever and weight loss.  HENT: Negative for congestion, ear discharge and nosebleeds.   Eyes: Negative for blurred vision.  Respiratory: Negative for cough, hemoptysis, sputum production, shortness of breath and wheezing.   Cardiovascular:  Negative for chest pain, palpitations, orthopnea and claudication.  Gastrointestinal: Negative for abdominal pain, blood in stool, constipation, diarrhea, heartburn, melena, nausea and vomiting.  Genitourinary: Negative for dysuria, flank pain, frequency, hematuria and urgency.  Musculoskeletal: Negative for back pain, joint pain and myalgias.  Skin: Negative for rash.  Neurological: Negative for dizziness, tingling, focal weakness, seizures, weakness and headaches.  Endo/Heme/Allergies: Does not bruise/bleed easily.  Psychiatric/Behavioral: Negative for depression and suicidal ideas. The patient does not have insomnia.     Allergies  Allergen Reactions  . Tape Rash     Past Medical History:  Diagnosis Date  . Anxiety   . Asthma   . Breast cancer ([place]) Right  . COPD (chronic obstructive pulmonary disease) ([place])   . Depression   . Personal history of radiation therapy      Past Surgical History:  Procedure Laterality Date  . ABDOMINAL HYSTERECTOMY    . APPENDECTOMY    . BACK SURGERY    . BREAST BIOPSY Right 2015   +  . BREAST EXCISIONAL BIOPSY Right 1975   neg  . BREAST LUMPECTOMY Right 2015   invasive lobular carcinoma  . CHOLECYSTECTOMY    . COLONOSCOPY     Scheduled for July 2018  . COLONOSCOPY WITH PROPOFOL N/A 08/14/2016   Procedure: COLONOSCOPY WITH PROPOFOL;  Surgeon: Manya Silvas, MD;  Location[facility] ENDOSCOPY;  Service: Endoscopy;  Laterality: N/A;  . ESOPHAGOGASTRODUODENOSCOPY (EGD) WITH PROPOFOL N/A 08/14/2016   Procedure: ESOPHAGOGASTRODUODENOSCOPY (EGD) WITH PROPOFOL;  Surgeon: Manya Silvas, MD;  Location: [facility] ENDOSCOPY;  Service: Endoscopy;  Laterality: N/A;    Social History   Socioeconomic History  . Marital status: Married    Spouse  name: ronald  . Number of children: 2  . Years of education: Not on file  . Highest education level: Some college, no degree  Occupational History  . Not on file  Tobacco Use  . Smoking status: Former  Smoker    Packs/day: 0.50    Years: 30.00    Pack years: 15.00    Types: Cigarettes    Quit date: 06/22/1995    Years since quitting: 24.0  . Smokeless tobacco: Never Used  Substance and Sexual Activity  . Alcohol use: Yes    Alcohol/week: 1.0 standard drinks    Types: 1 Glasses of wine per week    Comment: Occasional  . Drug use: No  . Sexual activity: Not Currently  Other Topics Concern  . Not on file  Social History Narrative  . Not on file   Social Determinants of Health   Financial Resource Strain:   . Difficulty of Paying Living Expenses:   Food Insecurity:   . Worried About Charity fundraiser in the Last Year:   . Arboriculturist in the Last Year:   Transportation Needs:   . Film/video editor (Medical):   Marland Kitchen Lack of Transportation (Non-Medical):   Physical Activity:   . Days of Exercise per Week:   . Minutes of Exercise per Session:   Stress:   . Feeling of Stress :   Social Connections:   . Frequency of Communication with Friends and Family:   . Frequency of Social Gatherings with Friends and Family:   . Attends Religious Services:   . Active Member of Clubs or Organizations:   . Attends Archivist Meetings:   Marland Kitchen Marital Status:   Intimate Partner Violence:   . Fear of Current or Ex-Partner:   . Emotionally Abused:   Marland Kitchen Physically Abused:   . Sexually Abused:     Family History  Problem Relation Age of Onset  . Cancer Father   . Breast cancer Neg Hx      Current Outpatient Medications:  .  acetaminophen (TYLENOL) 500 MG tablet, Take by mouth., Disp: , Rfl:  .  albuterol (PROVENTIL HFA;VENTOLIN HFA) 108 (90 Base) MCG/ACT inhaler, Inhale 2 puffs into the lungs every 6 (six) hours as needed., Disp: , Rfl:  .  amLODipine (NORVASC) 2.5 MG tablet, Take 2.5 mg by mouth daily., Disp: , Rfl:  .  atorvastatin (LIPITOR) 40 MG tablet, TAKE ONE TABLET BY MOUTH EVERY EVENING, Disp: , Rfl:  .  Azelastine-Fluticasone 137-50 MCG/ACT SUSP, Place 1 spray  into the nose daily. , Disp: , Rfl:  .  calcium carbonate (CALCIUM 600) 600 MG TABS tablet, Take 600 mg by mouth 2 (two) times daily., Disp: , Rfl:  .  Cholecalciferol (VITAMIN D3) 1000 units CAPS, Take 1,000 mg by mouth daily., Disp: , Rfl:  .  citalopram (CELEXA) 20 MG tablet, Take 1.5 tablets (30 mg total) by mouth daily., Disp: 135 tablet, Rfl: 1 .  clobetasol cream (TEMOVATE) 8.31 %, Apply 1 application topically as needed. , Disp: , Rfl:  .  clotrimazole-betamethasone (LOTRISONE) cream, Apply 1 application topically 2 (two) times daily as needed. , Disp: , Rfl:  .  diclofenac (VOLTAREN) 75 MG EC tablet, Take 75 mg by mouth 2 (two) times daily., Disp: , Rfl:  .  fluconazole (DIFLUCAN) 150 MG tablet, Take 150 mg by mouth once. , Disp: , Rfl:  .  fluticasone (FLONASE) 50 MCG/ACT nasal spray, Place 1 spray into the nose  as needed., Disp: , Rfl:  .  Fluticasone-Umeclidin-Vilant (TRELEGY ELLIPTA) 100-62.5-25 MCG/INH AEPB, Inhale 1 puff into the lungs daily., Disp: , Rfl:  .  GLUCOSAMINE SULFATE PO, Take 1 capsule by mouth 2 (two) times daily., Disp: , Rfl:  .  hydrOXYzine (ATARAX/VISTARIL) 25 MG tablet, Take 1 tablet (25 mg total) by mouth daily as needed for anxiety. (Patient taking differently: Take 25 mg by mouth every 8 (eight) hours as needed for anxiety. ), Disp: 90 tablet, Rfl: 1 .  levothyroxine (SYNTHROID, LEVOTHROID) 50 MCG tablet, Take 50 mcg by mouth daily., Disp: , Rfl:  .  Melatonin 5 MG TABS, Take 1 tablet by mouth as needed. , Disp: , Rfl:  .  Melatonin-Pyridoxine 5-1 MG TABS, Take by mouth., Disp: , Rfl:  .  mirabegron ER (MYRBETRIQ) 25 MG TB24 tablet, Take by mouth., Disp: , Rfl:  .  mirabegron ER (MYRBETRIQ) 50 MG TB24 tablet, Take by mouth., Disp: , Rfl:  .  Multiple Vitamin (MULTI-VITAMINS) TABS, Take 1 tablet by mouth daily., Disp: , Rfl:  .  Nebulizers MISC, Use, Disp: , Rfl:  .  neomycin-polymyxin b-dexamethasone (MAXITROL) 3.5-10000-0.1 OINT, SMARTSIG:1 Sparingly In Eye(s)  Every Night, Disp: , Rfl:  .  omeprazole (PRILOSEC) 20 MG capsule, Take 20 mg by mouth daily., Disp: , Rfl:  .  PATADAY 0.2 % SOLN, , Disp: , Rfl:  .  Potassium 99 MG TABS, Take 1 tablet by mouth daily. , Disp: , Rfl:  .  Potassium Gluconate 2.5 MEQ TABS, Take by mouth., Disp: , Rfl:  .  simvastatin (ZOCOR) 80 MG tablet, Take 80 mg by mouth daily., Disp: , Rfl:  .  tiZANidine (ZANAFLEX) 2 MG tablet, Take by mouth., Disp: , Rfl:  .  vitamin B-12 (CYANOCOBALAMIN) 1000 MCG tablet, Take 1,000 mcg by mouth daily., Disp: , Rfl:   Physical exam:  Vitals:   06/17/19 0937 06/17/19 0940  BP:  125/69  Pulse:  70  Resp:  18  Temp:  97.8 F (36.6 C)  Weight: 117 lb 11.2 oz (53.4 kg)    Physical Exam Constitutional:      General: She is not in acute distress. Cardiovascular:     Rate and Rhythm: Normal rate and regular rhythm.     Heart sounds: Normal heart sounds.  Pulmonary:     Effort: Pulmonary effort is normal.     Breath sounds: Normal breath sounds.  Abdominal:     General: Bowel sounds are normal.     Palpations: Abdomen is soft.  Skin:    General: Skin is warm and dry.  Neurological:     Mental Status: She is alert and oriented to person, place, and time.   Breast exam: There is some induration and superficial edema noted at the site of prior lumpectomy in the right breast.  No palpable masses in bilateral breasts.  No palpable bilateral axillary adenopathy.  CMP Latest Ref Rng & Units 01/19/2019  Glucose 70 - 99 mg/dL 104(H)  BUN 8 - 23 mg/dL 27(H)  Creatinine 0.44 - 1.00 mg/dL 0.56  Sodium 135 - 145 mmol/L 142  Potassium 3.5 - 5.1 mmol/L 4.0  Chloride 98 - 111 mmol/L 111  CO2 22 - 32 mmol/L 22  Calcium 8.9 - 10.3 mg/dL 8.5(L)  Total Protein 6.5 - 8.1 g/dL -  Total Bilirubin 0.3 - 1.2 mg/dL -  Alkaline Phos 38 - 126 U/L -  AST 15 - 41 U/L -  ALT 0 - 44 U/L -  CBC Latest Ref Rng & Units 03/16/2019  WBC 4.0 - 10.5 K/uL 6.5  Hemoglobin 12.0 - 15.0 g/dL 12.1    Hematocrit 36.0 - 46.0 % 39.0  Platelets 150 - 400 K/uL 233    No images are attached to the encounter.  MR BREAST BILATERAL W WO CONTRAST INC CAD  Result Date: 06/17/2019 CLINICAL DATA:  History of right breast lobular carcinoma treated with lumpectomy and radiation in 2015. Patient recently presented with a palpable abnormality in the right retroareolar region. Biopsies were performed demonstrating normal tissue and usual ductal hyperplasia. LABS:  None EXAM: BILATERAL BREAST MRI WITH AND WITHOUT CONTRAST TECHNIQUE: Multiplanar, multisequence MR images of both breasts were obtained prior to and following the intravenous administration of 5 ml of Gadavist Three-dimensional MR images were rendered by post-processing of the original MR data on an independent workstation. The three-dimensional MR images were interpreted, and findings are reported in the following complete MRI report for this study. Three dimensional images were evaluated at the independent DynaCad workstation COMPARISON:  Recent mammography FINDINGS: Breast composition: c. Heterogeneous fibroglandular tissue. Background parenchymal enhancement: Mild Right breast: No mass or abnormal enhancement. Skin thickening is identified on the right. Left breast: No mass or abnormal enhancement. Lymph nodes: No abnormal appearing lymph nodes. Ancillary findings:  None. IMPRESSION: No MRI evidence of malignancy. The skin thickening on the right has been present since radiation. The increased prominence this year could be due to mild asymmetric edema. There is no underlying malignancy identified to suggest inflammatory breast cancer on MRI. RECOMMENDATION: Recommend continued annual mammography next due in August of 2021. BI-RADS CATEGORY  2: Benign. Electronically Signed   By: Dorise Bullion III M.D   On: 06/17/2019 16:35   US Breast Limited Uni Right Inc Axilla  Result Date: 05/27/2019 CLINICAL DATA:  Patient with history of right breast lobular  cancer diagnosed in 2015 status post lumpectomy and radiation treatment. Patient presents for palpable abnormality within the retroareolar right breast. EXAM: DIGITAL DIAGNOSTIC RIGHT MAMMOGRAM WITH CAD AND TOMO ULTRASOUND RIGHT BREAST COMPARISON:  Previous exam(s). ACR Breast Density Category c: The breast tissue is heterogeneously dense, which may obscure small masses. FINDINGS: When compared with multiple prior exams the right breast appears to have decreased in size with associated skin thickening. Additionally, there is increased density within the retroareolar right breast with suggestion of mild associated distortion which is nonfocal. No additional concerning masses, calcifications or nonsurgical distortion identified within the right breast. Mammographic images were processed with CAD. On physical exam, there is thickening of the skin with associated dimpling involving the right breast. Targeted ultrasound is performed, showing vague echogenic tissue within the retroareolar right breast with mild posterior acoustic shadowing. This is most defined within the right breast 7 o'clock position 1 cm from nipple at the patient reported site of palpable concern. Additional similar-appearing tissue is demonstrated within the right breast 12 o'clock and 3 o'clock position. No right axillary adenopathy. IMPRESSION: Findings are indeterminate however raise the possibility of retroareolar lobular carcinoma given the increased density within the retroareolar right breast, overall decrease in size of the right breast and associated skin thickening. RECOMMENDATION: Recommend ultrasound-guided core needle biopsy of the vague echogenic tissue within the retroareolar right breast, most pronounced at the 7 o'clock position. Three cores from slightly different retroareolar locations can be obtained to exclude the possibility of lobular carcinoma involving the retroareolar tissue. If the ultrasound-guided core needle biopsies  are benign, recommend further evaluation of the breast with bilateral  breast MRI. I have discussed the findings and recommendations with the patient. If applicable, a reminder letter will be sent to the patient regarding the next appointment. BI-RADS CATEGORY  4: Suspicious. Electronically Signed   By: Lovey Newcomer M.D.   On: 05/27/2019 16:33   MM DIAG BREAST TOMO UNI RIGHT  Result Date: 05/27/2019 CLINICAL DATA:  Patient with history of right breast lobular cancer diagnosed in 2015 status post lumpectomy and radiation treatment. Patient presents for palpable abnormality within the retroareolar right breast. EXAM: DIGITAL DIAGNOSTIC RIGHT MAMMOGRAM WITH CAD AND TOMO ULTRASOUND RIGHT BREAST COMPARISON:  Previous exam(s). ACR Breast Density Category c: The breast tissue is heterogeneously dense, which may obscure small masses. FINDINGS: When compared with multiple prior exams the right breast appears to have decreased in size with associated skin thickening. Additionally, there is increased density within the retroareolar right breast with suggestion of mild associated distortion which is nonfocal. No additional concerning masses, calcifications or nonsurgical distortion identified within the right breast. Mammographic images were processed with CAD. On physical exam, there is thickening of the skin with associated dimpling involving the right breast. Targeted ultrasound is performed, showing vague echogenic tissue within the retroareolar right breast with mild posterior acoustic shadowing. This is most defined within the right breast 7 o'clock position 1 cm from nipple at the patient reported site of palpable concern. Additional similar-appearing tissue is demonstrated within the right breast 12 o'clock and 3 o'clock position. No right axillary adenopathy. IMPRESSION: Findings are indeterminate however raise the possibility of retroareolar lobular carcinoma given the increased density within the retroareolar right  breast, overall decrease in size of the right breast and associated skin thickening. RECOMMENDATION: Recommend ultrasound-guided core needle biopsy of the vague echogenic tissue within the retroareolar right breast, most pronounced at the 7 o'clock position. Three cores from slightly different retroareolar locations can be obtained to exclude the possibility of lobular carcinoma involving the retroareolar tissue. If the ultrasound-guided core needle biopsies are benign, recommend further evaluation of the breast with bilateral breast MRI. I have discussed the findings and recommendations with the patient. If applicable, a reminder letter will be sent to the patient regarding the next appointment. BI-RADS CATEGORY  4: Suspicious. Electronically Signed   By: Lovey Newcomer M.D.   On: 05/27/2019 16:33   MM CLIP PLACEMENT RIGHT  Result Date: 06/03/2019 CLINICAL DATA:  Status post ultrasound-guided core needle biopsy of an ill-defined area of increased echogenicity in the 8 to 9 o'clock position of the right breast. EXAM: DIAGNOSTIC RIGHT MAMMOGRAM POST ULTRASOUND BIOPSY COMPARISON:  Previous exam(s). FINDINGS: Mammographic images were obtained following ultrasound guided biopsy of an area of ill-defined increased echogenicity in the 8-9 o'clock position of the right breast. The biopsy marking clip is in expected position at the site of biopsy. IMPRESSION: Appropriate positioning of the heart shaped biopsy marking clip at the site of biopsy in the outer right breast anteriorly. As currently positioned, this is in the upper outer quadrant. Final Assessment: Post Procedure Mammograms for Marker Placement Electronically Signed   By: Claudie Revering M.D.   On: 06/03/2019 13:53   [place] RT BREAST BX W LOC DEV 1ST LESION IMG BX SPEC US GUIDE  Addendum Date: 06/07/2019   ADDENDUM REPORT: 06/07/2019 13:14 ADDENDUM: PATHOLOGY revealed: A. BREAST, RIGHT 8:00 4 CM FN; ULTRASOUND-GUIDED BIOPSY: - BENIGN BREAST TISSUE WITH USUAL DUCTAL  HYPERPLASIA. - NEGATIVE FOR ATYPIA AND MALIGNANCY. - DEEPER SECTIONS EXAMINED. Comment: The majority of the specimen consists of unremarkable mature  adipose tissue. There is focal sampling of tissue with mammary ducts and lobules demonstrating usual ductal hyperplasia. Correlation with clinical impression and radiographic findings is required. Pathology results are CONCORDANT with imaging findings, per Dr. Claudie Revering. Pathology results and recommendations below were discussed with patient by telephone on 06/07/2019. Patient reported biopsy site doing well with slight tenderness at the site. Post biopsy care instructions were reviewed and questions were answered. Patient was instructed to call [facility] if any concerns or questions arise related to the biopsy. Recommendation: Bilateral breast MRI to further assess the findings of concern on the recent diagnostic mammogram and ultrasound of the right breast, especially in view of the patient's history of lobular cancer in 2015. Addendum by Electa Sniff RN on 06/07/2019. Electronically Signed   By: Claudie Revering M.D.   On: 06/07/2019 13:14   Result Date: 06/07/2019 CLINICAL DATA:  Mass felt by the patient in the lower outer right breast centered in the 7 o'clock position. There was ill-defined increased echogenicity in that region of the breast on recent ultrasound without a discrete mass visualized. She says she fell on the right breast 2 months ago and bruises easily. She had a right lumpectomy and radiation therapy for lobular cancer in 2015. EXAM: ULTRASOUND GUIDED RIGHT BREAST CORE NEEDLE BIOPSY COMPARISON:  Previous exam(s). PROCEDURE: I met with the patient and we discussed the procedure of ultrasound-guided biopsy, including benefits and alternatives. We discussed the high likelihood of a successful procedure. We discussed the risks of the procedure, including infection, bleeding, tissue injury, clip migration, and inadequate sampling. Informed  written consent was given. The usual time-out protocol was performed immediately prior to the procedure. Lesion quadrant: Lower outer quadrant Using sterile technique and 1% Lidocaine as local anesthetic, under direct ultrasound visualization, a 12 gauge spring-loaded device was used to perform biopsy of the recently demonstrated ill-defined increased echogenicity in the lower outer quadrant of the right breast. This was most prominent today in the 8-9 o'clock position, 4 cm from the nipple and not easily visualized in the 7 o'clock position. There is also diffuse soft tissue thickening and mild brown discoloration of the skin throughout that portion of the breast. The more confluent increased echogenicity in the 8-9 o'clock position was biopsied using an inferomedial approach. At the conclusion of the procedure a heart shaped tissue marker clip was deployed into the biopsy cavity. Follow up 2 view mammogram was performed and dictated separately. IMPRESSION: Ultrasound guided biopsy of ill-defined increased echogenicity in the 8 to 9 o'clock position of the right breast. No apparent complications. Electronically Signed: By: Claudie Revering M.D. On: 06/03/2019 13:37     Assessment and plan- Patient is a 85 y.o. female with stage I right breast cancer invasive lobular carcinoma s/p lumpectomyadjuvant radiation treatment and 5 years of tamoxifen.  She is here to discuss MRI results and further management  Patient had a fall following which she noticed swelling in the area of her right breast.  She underwent a diagnostic right breast mammogram which did not reveal any distinct mass but there was increased density noted in the retroareolar region of the right breast.  This was biopsied and was consistent with usual ductal hyperplasia.  This is a benign finding and does not require any excision.    However given her prior history of lobular carcinoma she underwent MRI of her bilateral breasts.  MRI results were not  back at the time of the visit but did come back in  a few hours after the patient left and I called the patient with those findings.  MRI did not reveal any evidence of malignancy.  There was mild prominence in the area of the lumpectomy likely due to asymmetric edema.  At this time no further intervention needs to be done.  She is due for her routine screening mammogram in August 2021 which she will keep.  I will see her back in 6 months   Visit Diagnosis 1. Abnormal mammogram      Dr. Randa Evens, MD, MPH [facility] [phone] 06/21/2019 10:06 AM

## 2019-07-14 ENCOUNTER — Emergency Department: Payer: Medicare Other

## 2019-07-14 ENCOUNTER — Emergency Department
Admission: EM | Admit: 2019-07-14 | Discharge: 2019-07-14 | Disposition: A | Discharge status: Home / Self Care | Payer: Medicare Other | Attending: Emergency Medicine | Admitting: Emergency Medicine

## 2019-07-14 ENCOUNTER — Other Ambulatory Visit: Payer: Self-pay

## 2019-07-14 DIAGNOSIS — S0181XA Laceration without foreign body of other part of head, initial encounter: Secondary | ICD-10-CM | POA: Insufficient documentation

## 2019-07-14 DIAGNOSIS — J449 Chronic obstructive pulmonary disease, unspecified: Secondary | ICD-10-CM | POA: Diagnosis not present

## 2019-07-14 DIAGNOSIS — Z853 Personal history of malignant neoplasm of breast: Secondary | ICD-10-CM | POA: Diagnosis not present

## 2019-07-14 DIAGNOSIS — S0121XA Laceration without foreign body of nose, initial encounter: Secondary | ICD-10-CM | POA: Diagnosis not present

## 2019-07-14 DIAGNOSIS — Z79899 Other long term (current) drug therapy: Secondary | ICD-10-CM | POA: Insufficient documentation

## 2019-07-14 DIAGNOSIS — W1830XA Fall on same level, unspecified, initial encounter: Secondary | ICD-10-CM | POA: Insufficient documentation

## 2019-07-14 DIAGNOSIS — T07XXXA Unspecified multiple injuries, initial encounter: Secondary | ICD-10-CM | POA: Diagnosis present

## 2019-07-14 DIAGNOSIS — Y92481 Parking lot as the place of occurrence of the external cause: Secondary | ICD-10-CM | POA: Insufficient documentation

## 2019-07-14 DIAGNOSIS — M25512 Pain in left shoulder: Secondary | ICD-10-CM | POA: Insufficient documentation

## 2019-07-14 DIAGNOSIS — Z87891 Personal history of nicotine dependence: Secondary | ICD-10-CM | POA: Insufficient documentation

## 2019-07-14 DIAGNOSIS — Y9301 Activity, walking, marching and hiking: Secondary | ICD-10-CM | POA: Insufficient documentation

## 2019-07-14 DIAGNOSIS — Y999 Unspecified external cause status: Secondary | ICD-10-CM | POA: Diagnosis not present

## 2019-07-14 DIAGNOSIS — M25552 Pain in left hip: Secondary | ICD-10-CM | POA: Diagnosis not present

## 2019-07-14 DIAGNOSIS — Z23 Encounter for immunization: Secondary | ICD-10-CM | POA: Insufficient documentation

## 2019-07-14 DIAGNOSIS — W19XXXA Unspecified fall, initial encounter: Secondary | ICD-10-CM

## 2019-07-14 MED ORDER — TETANUS-DIPHTH-ACELL PERTUSSIS 5-2.5-18.5 LF-MCG/0.5 IM SUSP
0.5000 mL | Freq: Once | INTRAMUSCULAR | Status: AC
  Administered 2019-07-14: 0.5 mL via INTRAMUSCULAR
  Filled 2019-07-14: qty 0.5

## 2019-07-14 MED ORDER — ONDANSETRON 4 MG PO TBDP
4.0000 mg | ORAL_TABLET | Freq: Once | ORAL | Status: AC
  Administered 2019-07-14: 4 mg via ORAL
  Filled 2019-07-14: qty 1

## 2019-07-14 MED ORDER — HYDROCODONE-ACETAMINOPHEN 5-325 MG PO TABS
1.0000 | ORAL_TABLET | Freq: Once | ORAL | Status: AC
  Administered 2019-07-14: 1 via ORAL
  Filled 2019-07-14: qty 1

## 2019-07-14 NOTE — ED Provider Notes (Signed)
Emergency Department Provider Note  ____________________________________________  Time seen: Approximately 3:43 PM  I have reviewed the triage vital signs and the nursing notes.   HISTORY  Chief Complaint Fall   Historian Patient     HPI Julie Mckinney is a 85 y.o. female presents to the emergency department after a mechanical, nonsyncopal fall.  Patient states that she did hit her head and sustained a 3 cm triangular-shaped laceration along left temple and a small linear laceration along the bridge of her nose.  She did not lose consciousness and denies current neck pain.  She does state that she is having some left shoulder discomfort that is worsened with movement and some left hip pain.  She states that she ordinarily ambulates with a walker.  She denies chest pain, chest tightness or abdominal pain.  Fall was witnessed by her husband.   Past Medical History:  Diagnosis Date  . Anxiety   . Asthma   . Breast cancer ([place]) Right  . COPD (chronic obstructive pulmonary disease) ([place])   . Depression   . Personal history of radiation therapy      Immunizations up to date:  Yes.     Past Medical History:  Diagnosis Date  . Anxiety   . Asthma   . Breast cancer ([place]) Right  . COPD (chronic obstructive pulmonary disease) ([place])   . Depression   . Personal history of radiation therapy     Patient Active Problem List   Diagnosis Date Noted  . Hammer toes, bilateral 05/17/2019  . Pain due to onychomycosis of toenails of both feet 05/17/2019  . Other hyperlipidemia   . T10 vertebral fracture ([place]) 01/17/2019  . Closed stable burst fracture of T12 vertebra ([place])   . Fall   . MDD (major depressive disorder), recurrent, in partial remission ([place]) 01/06/2019  . H/O macrocytic anemia 12/08/2018  . DNR (do not resuscitate) 12/08/2018  . MDD (major depressive disorder), recurrent episode, mild ([place]) 09/22/2018  . GAD (generalized anxiety disorder) 09/22/2018  . Insomnia  due to medical condition 09/22/2018  . Medicare annual wellness visit, initial 08/04/2018  . H/O orthostatic hypotension 02/05/2018  . Essential hypertension 02/05/2018  . Acute blood loss anemia 05/13/2017  . Postoperative CSF leak 05/13/2017  . History of CVA (cerebrovascular accident) without residual deficits 05/12/2017  . Anemia 05/02/2017  . Vitamin B 12 deficiency 10/28/2016  . Diarrhea of infectious origin 09/12/2016  . Acquired hypothyroidism 07/02/2016  . Chronic obstructive pulmonary disease ([place]) 07/02/2016  . Depression, major, single episode, complete remission ([place]) 07/02/2016  . Gastroesophageal reflux disease without esophagitis 07/02/2016  . Lumbar radiculopathy 07/02/2016  . Osteopenia of multiple sites 07/02/2016  . Pure hypercholesterolemia 07/02/2016  . History of diarrhea 06/10/2016  . Weight loss, abnormal 06/10/2016  . Abnormal CT scan, colon 06/10/2016  . Breast cancer of upper-outer quadrant of right female breast ([place]) 11/02/2013  . History of right breast cancer 11/02/2013    Past Surgical History:  Procedure Laterality Date  . ABDOMINAL HYSTERECTOMY    . APPENDECTOMY    . BACK SURGERY    . BREAST BIOPSY Right 2015   +  . BREAST EXCISIONAL BIOPSY Right 1975   neg  . BREAST LUMPECTOMY Right 2015   invasive lobular carcinoma  . CHOLECYSTECTOMY    . COLONOSCOPY     Scheduled for July 2018  . COLONOSCOPY WITH PROPOFOL N/A 08/14/2016   Procedure: COLONOSCOPY WITH PROPOFOL;  Surgeon: Manya Silvas, MD;  Location: [facility] ENDOSCOPY;  Service: Endoscopy;  Laterality: N/A;  . ESOPHAGOGASTRODUODENOSCOPY (EGD) WITH PROPOFOL N/A 08/14/2016   Procedure: ESOPHAGOGASTRODUODENOSCOPY (EGD) WITH PROPOFOL;  Surgeon: Manya Silvas, MD;  Location: [facility] ENDOSCOPY;  Service: Endoscopy;  Laterality: N/A;    Prior to Admission medications   Medication Sig Start Date End Date Taking? Authorizing Provider  acetaminophen (TYLENOL) 500 MG tablet Take by mouth.     [provider]  albuterol (PROVENTIL HFA;VENTOLIN HFA) 108 (90 Base) MCG/ACT inhaler Inhale 2 puffs into the lungs every 6 (six) hours as needed.    [provider]  amLODipine (NORVASC) 2.5 MG tablet Take 2.5 mg by mouth daily.    [provider]  atorvastatin (LIPITOR) 40 MG tablet TAKE ONE TABLET BY MOUTH EVERY EVENING 03/05/19   [provider]  Azelastine-Fluticasone 137-50 MCG/ACT SUSP Place 1 spray into the nose daily.  01/27/18   [provider]  calcium carbonate (CALCIUM 600) 600 MG TABS tablet Take 600 mg by mouth 2 (two) times daily.    [provider]  Cholecalciferol (VITAMIN D3) 1000 units CAPS Take 1,000 mg by mouth daily.    [provider]  citalopram (CELEXA) 20 MG tablet Take 1.5 tablets (30 mg total) by mouth daily. 05/20/19   Ursula Alert, MD  clobetasol cream (TEMOVATE) 0.96 % Apply 1 application topically as needed.  09/26/17   [provider]  clotrimazole-betamethasone (LOTRISONE) cream Apply 1 application topically 2 (two) times daily as needed.     [provider]  diclofenac (VOLTAREN) 75 MG EC tablet Take 75 mg by mouth 2 (two) times daily.    [provider]  fluconazole (DIFLUCAN) 150 MG tablet Take 150 mg by mouth once.  09/10/17   [provider]  fluticasone (FLONASE) 50 MCG/ACT nasal spray Place 1 spray into the nose as needed.    [provider]  Fluticasone-Umeclidin-Vilant (TRELEGY ELLIPTA) 100-62.5-25 MCG/INH AEPB Inhale 1 puff into the lungs daily.    [provider]  GLUCOSAMINE SULFATE PO Take 1 capsule by mouth 2 (two) times daily.    [provider]  hydrOXYzine (ATARAX/VISTARIL) 25 MG tablet Take 1 tablet (25 mg total) by mouth daily as needed for anxiety. Patient taking differently: Take 25 mg by mouth every 8 (eight) hours as needed for anxiety.  09/22/18   Ursula Alert, MD  levothyroxine (SYNTHROID, LEVOTHROID) 50 MCG tablet Take  50 mcg by mouth daily.    [provider]  Melatonin 5 MG TABS Take 1 tablet by mouth as needed.     [provider]  Melatonin-Pyridoxine 5-1 MG TABS Take by mouth.    [provider]  mirabegron ER (MYRBETRIQ) 25 MG TB24 tablet Take by mouth. 08/19/18   [provider]  mirabegron ER (MYRBETRIQ) 50 MG TB24 tablet Take by mouth. 09/24/18   [provider]  Multiple Vitamin (MULTI-VITAMINS) TABS Take 1 tablet by mouth daily.    [provider]  Nebulizers MISC Use    [provider]  neomycin-polymyxin b-dexamethasone (MAXITROL) 3.5-10000-0.1 OINT SMARTSIG:1 Sparingly In Eye(s) Every Night 03/15/19   [provider]  omeprazole (PRILOSEC) 20 MG capsule Take 20 mg by mouth daily.    [provider]  PATADAY 0.2 % SOLN  04/06/19   [provider]  Potassium 99 MG TABS Take 1 tablet by mouth daily.     [provider]  Potassium Gluconate 2.5 MEQ TABS Take by mouth.    [provider]  simvastatin (ZOCOR) 80  MG tablet Take 80 mg by mouth daily.    [provider]  tiZANidine (ZANAFLEX) 2 MG tablet Take by mouth. 04/22/19 04/21/20  [provider]  vitamin B-12 (CYANOCOBALAMIN) 1000 MCG tablet Take 1,000 mcg by mouth daily.    [provider]    Allergies Tape  Family History  Problem Relation Age of Onset  . Cancer Father   . Breast cancer Neg Hx     Social History Social History   Tobacco Use  . Smoking status: Former Smoker    Packs/day: 0.50    Years: 30.00    Pack years: 15.00    Types: Cigarettes    Quit date: 06/22/1995    Years since quitting: 24.0  . Smokeless tobacco: Never Used  Vaping Use  . Vaping Use: Never used  Substance Use Topics  . Alcohol use: Yes    Alcohol/week: 1.0 standard drink    Types: 1 Glasses of wine per week    Comment: Occasional  . Drug use: No     Review of Systems  Constitutional: No fever/chills Eyes:  No  discharge ENT: No upper respiratory complaints. Respiratory: no cough. No SOB/ use of accessory muscles to breath Gastrointestinal:   No nausea, no vomiting.  No diarrhea.  No constipation. Musculoskeletal: Negative for musculoskeletal pain. Skin: Patient has lacerations.    ____________________________________________   PHYSICAL EXAM:  VITAL SIGNS: ED Triage Vitals [07/14/19 1343]  Enc Vitals Group     BP 131/60     Pulse Rate 68     Resp 18     Temp 98.9 F (37.2 C)     Temp src      SpO2 99 %     Weight 114 lb (51.7 kg)     Height 5' (1.524 m)     Head Circumference      Peak Flow      Pain Score 6     Pain Loc      Pain Edu?      Excl. in [place]?      Constitutional: Alert and oriented. Well appearing and in no acute distress. Eyes: Conjunctivae are normal. PERRL. EOMI. Head: Atraumatic. ENT:      Nose: No congestion/rhinnorhea.      Mouth/Throat: Mucous membranes are moist.  Neck: No stridor.  Full range of motion.  No midline C-spine tenderness to palpation. Cardiovascular: Normal rate, regular rhythm. Normal S1 and S2.  Good peripheral circulation. Respiratory: Normal respiratory effort without tachypnea or retractions. Lungs CTAB. Good air entry to the bases with no decreased or absent breath sounds Gastrointestinal: Bowel sounds x 4 quadrants. Soft and nontender to palpation. No guarding or rigidity. No distention. Musculoskeletal: Full range of motion to all extremities. No obvious deformities noted Neurologic:  Normal for age. No gross focal neurologic deficits are appreciated.  Skin:  Skin is warm, dry and intact. No rash noted. Psychiatric: Mood and affect are normal for age. Speech and behavior are normal.   ____________________________________________   LABS (all labs ordered are listed, but only abnormal results are displayed)  Labs Reviewed - No data to  display ____________________________________________  EKG   ____________________________________________  RADIOLOGY Unk Pinto, personally viewed and evaluated these images (plain radiographs) as part of my medical decision making, as well as reviewing the written report by the radiologist.  CT Head Wo Contrast  Result Date: 07/14/2019 CLINICAL DATA:  Ataxia, head trauma. EXAM: CT HEAD WITHOUT CONTRAST TECHNIQUE: Contiguous axial images were  obtained from the base of the skull through the vertex without intravenous contrast. COMPARISON:  Head CT 01/17/2019 FINDINGS: Brain: There is stable, mild generalized parenchymal atrophy. Stable mild ill-defined hypoattenuation within cerebral white matter which is nonspecific, but consistent with chronic small vessel ischemic disease. Redemonstrated small chronic infarct within the left cerebellum. There is no acute intracranial hemorrhage. No demarcated cortical infarct is identified. No extra-axial fluid collection. No evidence of intracranial mass. No midline shift. Vascular: No hyperdense vessel.  Atherosclerotic calcifications. Skull: Normal. Negative for fracture or focal lesion. Sinuses/Orbits: Left periorbital soft tissue swelling. A punctate hyperdensity in the left periorbital soft tissues may reflect a calcification or foreign body. IMPRESSION: No evidence of acute intracranial abnormality. Left periorbital soft tissue swelling. A punctate hyperdensity within the left periorbital soft tissues may reflect a calcification or foreign body. Correlate with direct visualization. Stable mild generalized parenchymal atrophy and chronic small vessel ischemic disease. Redemonstrated small chronic infarct within the left cerebellum. Electronically Signed   By: Kellie Simmering DO   On: 07/14/2019 14:30   DG Shoulder Left  Result Date: 07/14/2019 CLINICAL DATA:  Status post fall. EXAM: LEFT SHOULDER - 2+ VIEW COMPARISON:  January 17, 2019 FINDINGS: There is  no evidence of fracture or dislocation. Moderate severity degenerative changes seen involving the left acromioclavicular joint. Soft tissues are unremarkable. IMPRESSION: No acute osseous abnormality. Electronically Signed   By: Virgina Norfolk M.D.   On: 07/14/2019 16:03   DG Hip Unilat W or Wo Pelvis 2-3 Views Left  Result Date: 07/14/2019 CLINICAL DATA:  Status post fall. EXAM: DG HIP (WITH OR WITHOUT PELVIS) 2-3V LEFT COMPARISON:  January 17, 2019 FINDINGS: Radiopaque intramedullary rod and fixation screws are seen within the proximal femurs, bilaterally. These are seen on the prior study. There is no evidence of an acute hip fracture or dislocation. Mild degenerative changes seen involving the bilateral hips. Bilateral radiopaque pedicle screws are seen at the levels of L5 and S1 with radiopaque operative material seen at the level of L5-S1. This is also seen on the prior exam. IMPRESSION: Postoperative changes, as described above, without evidence of acute osseous abnormality. Electronically Signed   By: Virgina Norfolk M.D.   On: 07/14/2019 16:02    ____________________________________________    PROCEDURES  Procedure(s) performed:     Marland KitchenMarland KitchenLaceration Repair  Date/Time: 07/14/2019 5:25 PM Performed by: Lannie Fields, PA-C Authorized by: Lannie Fields, PA-C   Consent:    Consent obtained:  Verbal   Consent given by:  Patient Anesthesia (see MAR for exact dosages):    Anesthesia method:  None Laceration details:    Location:  Face   Facial location: left temple.   Length (cm):  3   Depth (mm):  2 Repair type:    Repair type:  Simple Exploration:    Contaminated: no   Treatment:    Amount of cleaning:  Standard   Irrigation solution:  Sterile saline   Visualized foreign bodies/material removed: no   Skin repair:    Repair method:  Tissue adhesive Approximation:    Approximation:  Close Post-procedure details:    Dressing:  Open (no dressing)   Patient tolerance  of procedure:  Tolerated well, no immediate complications       Medications  HYDROcodone-acetaminophen (NORCO/VICODIN) 5-325 MG per tablet 1 tablet (1 tablet Oral Given 07/14/19 1556)  ondansetron (ZOFRAN-ODT) disintegrating tablet 4 mg (4 mg Oral Given 07/14/19 1556)  Tdap (BOOSTRIX) injection 0.5 mL (0.5 mLs Intramuscular Given 07/14/19  1637)     ____________________________________________   INITIAL IMPRESSION / ASSESSMENT AND PLAN / ED COURSE  Pertinent labs & imaging results that were available during my care of the patient were reviewed by me and considered in my medical decision making (see chart for details).      Assessment and Plan:  Fall 85 year old female presents to the emergency department after a mechanical, nonsyncopal fall.  Vital signs are reassuring in triage.  Neuro exam was without acute deficits.  Patient's lacerations were repaired with Dermabond in the emergency department.  X-ray of the left shoulder and left hip revealed no acute bony abnormality.  Patient was discharged with a short course of tramadol.  Return precautions were given to return with new or worsening symptoms.   ____________________________________________  FINAL CLINICAL IMPRESSION(S) / ED DIAGNOSES  Final diagnoses:  Fall, initial encounter      NEW MEDICATIONS STARTED DURING THIS VISIT:  ED Discharge Orders    None          This chart was dictated using voice recognition software/Dragon. Despite best efforts to proofread, errors can occur which can change the meaning. Any change was purely unintentional.     Karren Cobble 07/14/19 1727    Carrie Mew, MD 07/14/19 2245

## 2019-07-14 NOTE — ED Notes (Signed)
See triage note  Presents s/p fall  States she lost her balance  Abrasions to left arm and shoulder  Small laceration to face  Bruising noted to face

## 2019-07-14 NOTE — ED Triage Notes (Signed)
Pt comes via pOV from home with c/o fall. Pt states she tripped and fell onto asphalt outside in the parking lot of [facility]. Pt states she hit her head, nose, left arm and shoulder. Pt denies any LOC. Pt not on blood thinners.  Pt states left arm abrasion and shoulder.  Pt has bandage noted to head. No bleeding at this time.

## 2019-08-25 ENCOUNTER — Other Ambulatory Visit: Payer: Self-pay

## 2019-08-25 ENCOUNTER — Ambulatory Visit
Admission: RE | Admit: 2019-08-25 | Discharge: 2019-08-25 | Disposition: A | Discharge status: Home / Self Care | Payer: Medicare Other | Source: Ambulatory Visit | Attending: Oncology | Admitting: Oncology

## 2019-08-25 DIAGNOSIS — Z1231 Encounter for screening mammogram for malignant neoplasm of breast: Secondary | ICD-10-CM | POA: Diagnosis present

## 2019-08-25 DIAGNOSIS — Z853 Personal history of malignant neoplasm of breast: Secondary | ICD-10-CM | POA: Insufficient documentation

## 2019-08-25 DIAGNOSIS — Z08 Encounter for follow-up examination after completed treatment for malignant neoplasm: Secondary | ICD-10-CM | POA: Insufficient documentation

## 2019-08-27 ENCOUNTER — Other Ambulatory Visit: Payer: Self-pay | Admitting: Neurosurgery

## 2019-08-27 DIAGNOSIS — S22071D Stable burst fracture of T9-T10 vertebra, subsequent encounter for fracture with routine healing: Secondary | ICD-10-CM

## 2019-08-27 DIAGNOSIS — R2689 Other abnormalities of gait and mobility: Secondary | ICD-10-CM

## 2019-09-15 ENCOUNTER — Other Ambulatory Visit: Payer: Self-pay

## 2019-09-15 ENCOUNTER — Ambulatory Visit
Admission: RE | Admit: 2019-09-15 | Discharge: 2019-09-15 | Disposition: A | Discharge status: Home / Self Care | Payer: Medicare Other | Source: Ambulatory Visit | Attending: Neurosurgery | Admitting: Neurosurgery

## 2019-09-15 DIAGNOSIS — R2689 Other abnormalities of gait and mobility: Secondary | ICD-10-CM | POA: Insufficient documentation

## 2019-09-15 DIAGNOSIS — S22071D Stable burst fracture of T9-T10 vertebra, subsequent encounter for fracture with routine healing: Secondary | ICD-10-CM | POA: Insufficient documentation

## 2019-09-30 ENCOUNTER — Telehealth: Payer: Medicare Other | Admitting: Psychiatry

## 2019-10-19 IMAGING — MG DIGITAL DIAGNOSTIC BILATERAL MAMMOGRAM WITH TOMO AND CAD
8 of 13 series · 8 of 37 positions shown · non-contrast
Comparison: Previous exam(s).

CLINICAL DATA: History of right breast cancer status post
lumpectomy in 3496.

EXAM:
DIGITAL DIAGNOSTIC BILATERAL MAMMOGRAM WITH CAD AND TOMO

[R MLO]
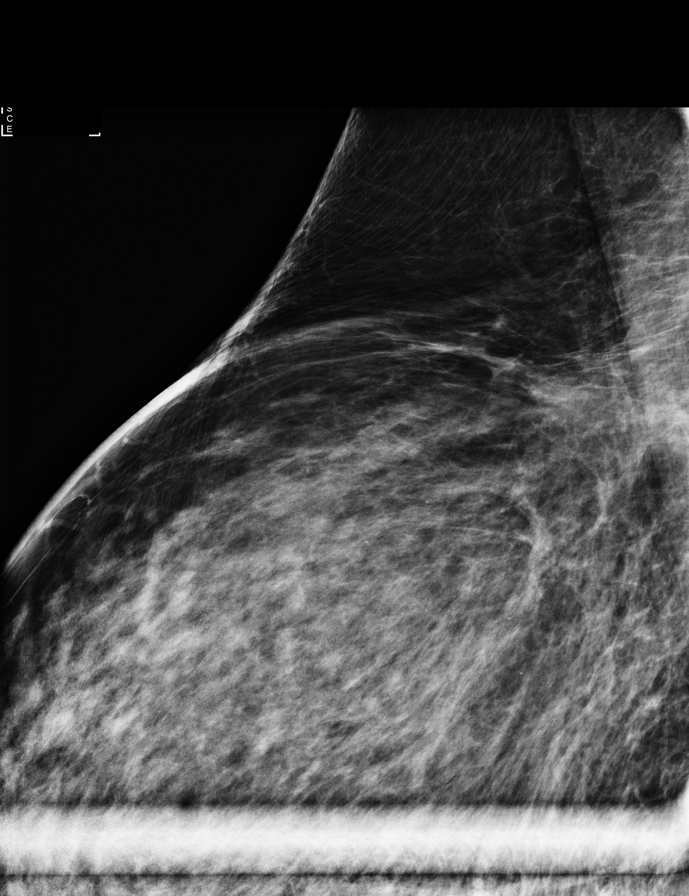

[L MLO synth-2D (1 of 2)]
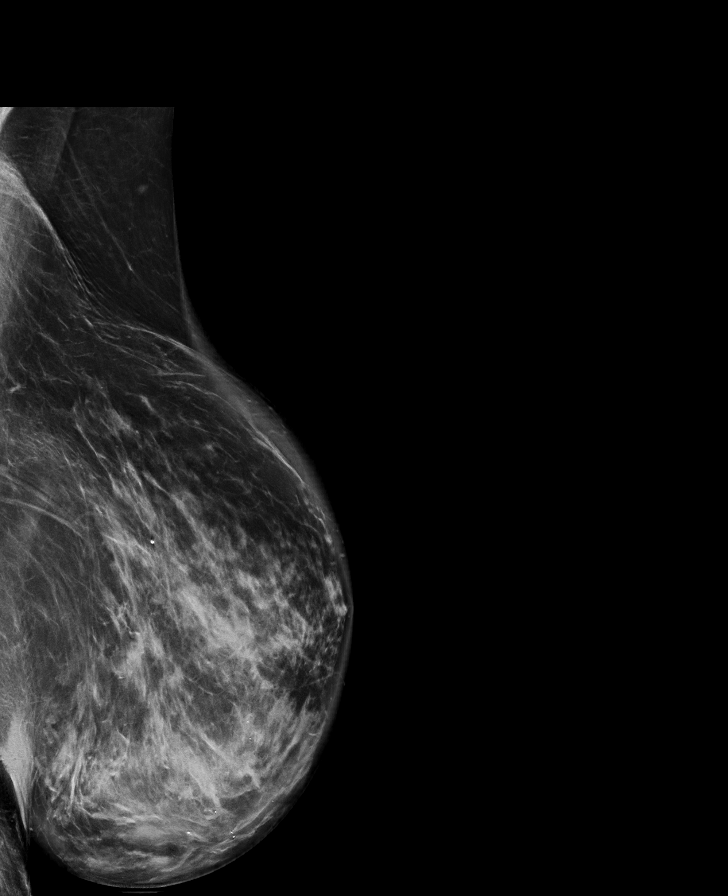

[R XCCL synth-2D]
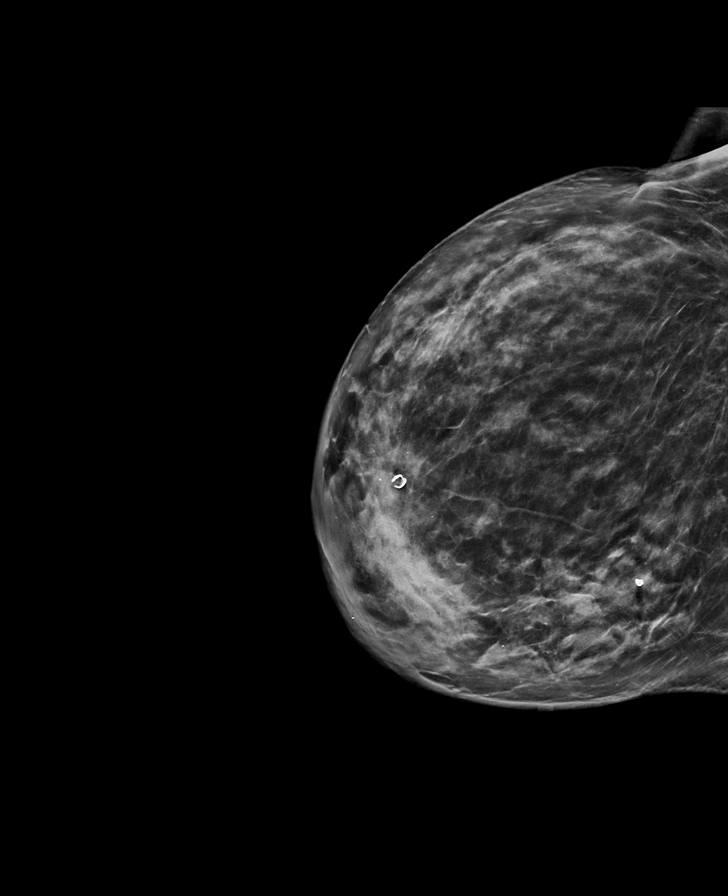

[L MLO synth-2D (2 of 2)]
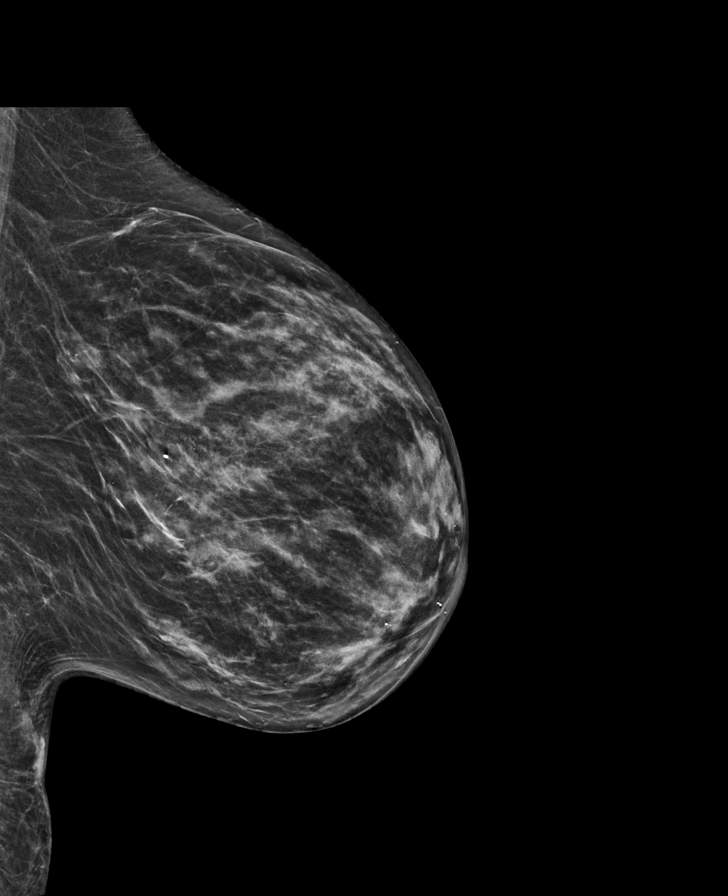

[R MLO synth-2D]
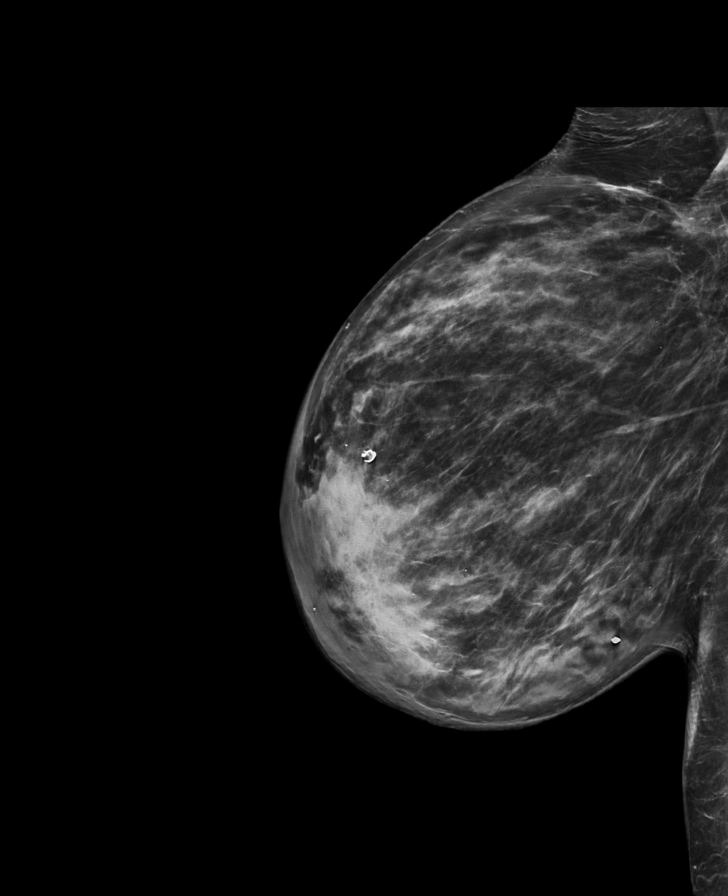

[R CC synth-2D]
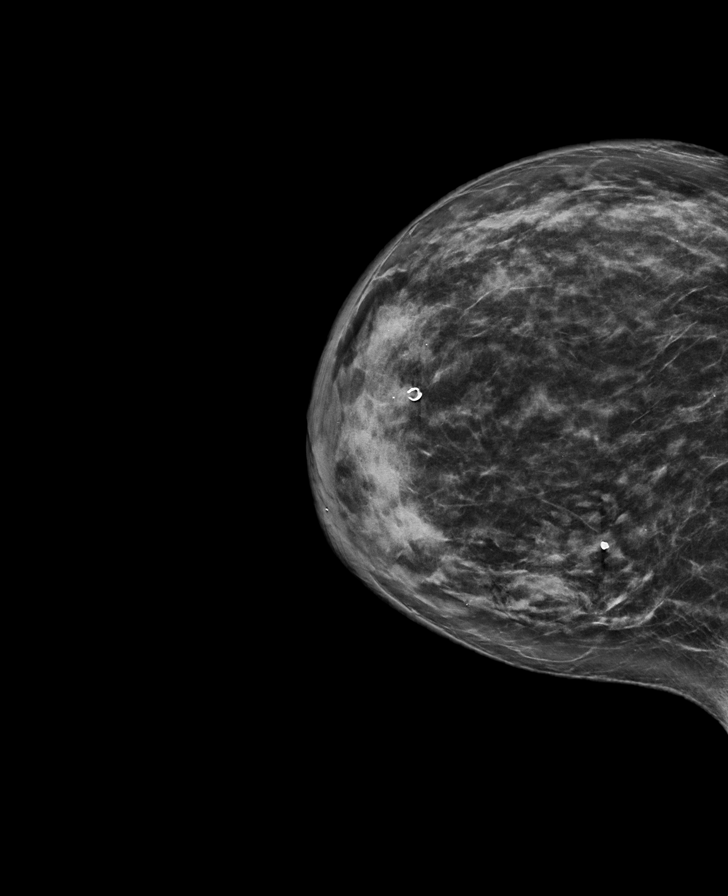

[L CC synth-2D]
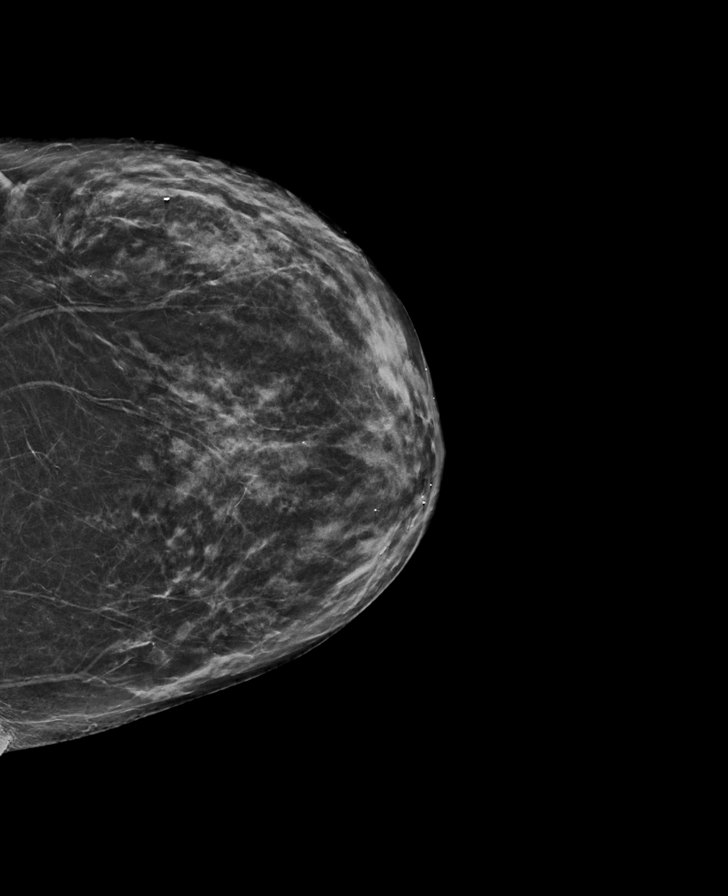

[R MLO tomo · tomo slice 40/79.0]
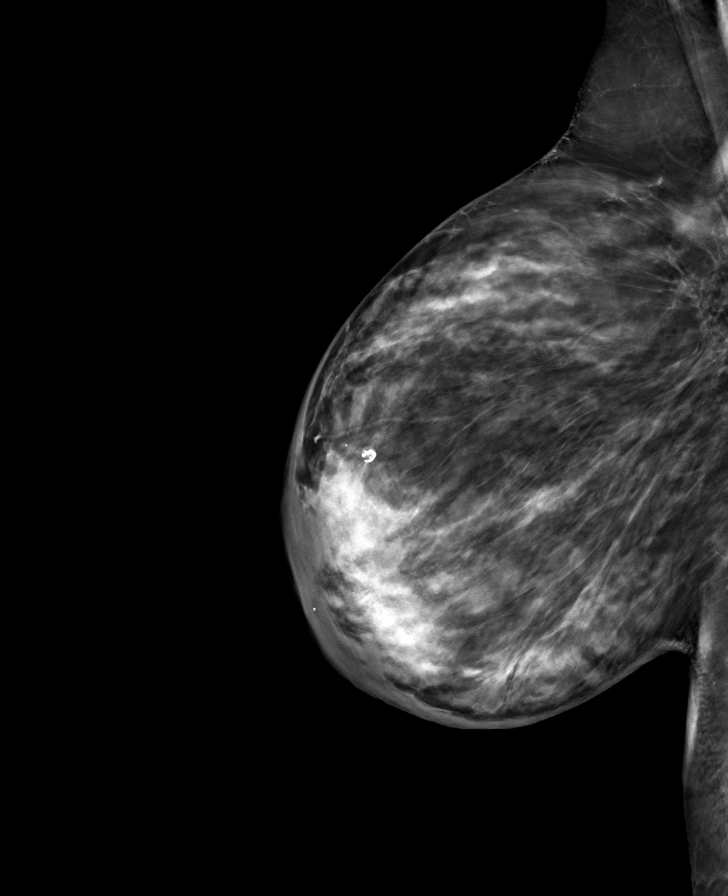

[8 of 37 positions shown; findings below may reference images not displayed]

ACR Breast Density Category c: The breast tissue is heterogeneously
dense, which may obscure small masses.
FINDINGS: Stable lumpectomy changes are seen in the right breast. No
suspicious mass or malignant type microcalcifications identified in
either breast.

Mammographic images were processed with CAD.
IMPRESSION: No evidence of malignancy in either breast.

RECOMMENDATION:
Bilateral screening mammogram in 1 year is recommended.

I have discussed the findings and recommendations with the patient.
Results were also provided in writing at the conclusion of the
visit. If applicable, a reminder letter will be sent to the patient
regarding the next appointment.

BI-RADS CATEGORY  2: Benign.

## 2019-10-21 ENCOUNTER — Telehealth (INDEPENDENT_AMBULATORY_CARE_PROVIDER_SITE_OTHER): Payer: Medicare Other | Admitting: Psychiatry

## 2019-10-21 ENCOUNTER — Encounter: Payer: Self-pay | Admitting: Psychiatry

## 2019-10-21 ENCOUNTER — Other Ambulatory Visit: Payer: Self-pay

## 2019-10-21 DIAGNOSIS — F411 Generalized anxiety disorder: Secondary | ICD-10-CM | POA: Diagnosis not present

## 2019-10-21 DIAGNOSIS — G4701 Insomnia due to medical condition: Secondary | ICD-10-CM

## 2019-10-21 DIAGNOSIS — F3342 Major depressive disorder, recurrent, in full remission: Secondary | ICD-10-CM

## 2019-10-21 MED ORDER — CITALOPRAM HYDROBROMIDE 20 MG PO TABS: 30.0000 mg | ORAL_TABLET | Freq: Every day | ORAL | 1 refills | Status: DC

## 2019-10-21 MED ORDER — BUSPIRONE HCL 7.5 MG PO TABS: 7.5000 mg | ORAL_TABLET | Freq: Two times a day (BID) | ORAL | 1 refills | Status: DC

## 2019-10-21 NOTE — Progress Notes (Signed)
Provider Location : [facility] Patient Location : Home  Participants: Patient , Provider  Virtual Visit via Telephone Note  I connected with Julie Mckinney on 10/21/19 at  8:30 AM EDT by telephone and verified that I am speaking with the correct person using two identifiers.   I discussed the limitations, risks, security and privacy concerns of performing an evaluation and management service by telephone and the availability of in person appointments. I also discussed with the patient that there may be a patient responsible charge related to this service. The patient expressed understanding and agreed to proceed.   I discussed the assessment and treatment plan with the patient. The patient was provided an opportunity to ask questions and all were answered. The patient agreed with the plan and demonstrated an understanding of the instructions.   The patient was advised to call back or seek an in-person evaluation if the symptoms worsen or if the condition fails to improve as anticipated.   [facility] Julie Mckinney OP Progress Note  10/21/2019 3:18 PM Julie Mckinney  MRN:  [id number]  Chief Complaint:  Chief Complaint    Follow-up     HPI: Julie Mckinney is a 85 year old Caucasian female, lives in a senior living community in [place], married, has a history of MDD, GAD, COPD, chronic pain rheumatoid arthritis, history of breast cancer in remission on tamoxifen, recent fracture of her spine in December 2020, chronic pain was evaluated by phone today.  Patient today reports she continues to struggle with spinal pain from the fracture that she had.  She continues to use a cane or a walker to walk.  She reports she was told she might need a spinal cord stimulator to manage her pain.  She reports her health problems does make her anxious.  She wonders whether her medications can be readjusted to help with that.  She reports sleep is okay as long as her pain is under control.  Patient denies any  suicidality, homicidality or perceptual disturbances.  She denies any other concerns today.  Visit Diagnosis:    ICD-10-CM   1. MDD (major depressive disorder), recurrent, in full remission ([place])  F33.42 citalopram (CELEXA) 20 MG tablet  2. GAD (generalized anxiety disorder)  F41.1 busPIRone (BUSPAR) 7.5 MG tablet    citalopram (CELEXA) 20 MG tablet  3. Insomnia due to medical condition  G47.01     Past Psychiatric History: I have reviewed past psychiatric history from my progress note on 10/23/2017.  Past trials of Celexa, mirtazapine, melatonin  Past Medical History:  Past Medical History:  Diagnosis Date  . Anxiety   . Asthma   . Breast cancer ([place]) Right  . COPD (chronic obstructive pulmonary disease) ([place])   . Depression   . Personal history of radiation therapy     Past Surgical History:  Procedure Laterality Date  . ABDOMINAL HYSTERECTOMY    . APPENDECTOMY    . BACK SURGERY    . BREAST BIOPSY Right 2015   +  . BREAST EXCISIONAL BIOPSY Right 1975   neg  . BREAST LUMPECTOMY Right 2015   invasive lobular carcinoma  . CHOLECYSTECTOMY    . COLONOSCOPY     Scheduled for July 2018  . COLONOSCOPY WITH PROPOFOL N/A 08/14/2016   Procedure: COLONOSCOPY WITH PROPOFOL;  Surgeon: Manya Silvas, Julie Mckinney;  Location: [facility] ENDOSCOPY;  Service: Endoscopy;  Laterality: N/A;  . ESOPHAGOGASTRODUODENOSCOPY (EGD) WITH PROPOFOL N/A 08/14/2016   Procedure: ESOPHAGOGASTRODUODENOSCOPY (EGD) WITH PROPOFOL;  Surgeon: Manya Silvas, Julie Mckinney;  Location:  [facility] ENDOSCOPY;  Service: Endoscopy;  Laterality: N/A;    Family Psychiatric History: I have reviewed family psychiatric history from my progress note on 10/23/2017  Family History:  Family History  Problem Relation Age of Onset  . Cancer Father   . Breast cancer Neg Hx     Social History: I have reviewed social history from my progress note on 10/23/2017 Social History   Socioeconomic History  . Marital status: Married    Spouse name:  ronald  . Number of children: 2  . Years of education: Not on file  . Highest education level: Some college, no degree  Occupational History  . Not on file  Tobacco Use  . Smoking status: Former Smoker    Packs/day: 0.50    Years: 30.00    Pack years: 15.00    Types: Cigarettes    Quit date: 06/22/1995    Years since quitting: 24.3  . Smokeless tobacco: Never Used  Vaping Use  . Vaping Use: Never used  Substance and Sexual Activity  . Alcohol use: Yes    Alcohol/week: 1.0 standard drink    Types: 1 Glasses of wine per week    Comment: Occasional  . Drug use: No  . Sexual activity: Not Currently  Other Topics Concern  . Not on file  Social History Narrative  . Not on file   Social Determinants of Health   Financial Resource Strain:   . Difficulty of Paying Living Expenses: Not on file  Food Insecurity:   . Worried About Charity fundraiser in the Last Year: Not on file  . Ran Out of Food in the Last Year: Not on file  Transportation Needs:   . Lack of Transportation (Medical): Not on file  . Lack of Transportation (Non-Medical): Not on file  Physical Activity:   . Days of Exercise per Week: Not on file  . Minutes of Exercise per Session: Not on file  Stress:   . Feeling of Stress : Not on file  Social Connections:   . Frequency of Communication with Friends and Family: Not on file  . Frequency of Social Gatherings with Friends and Family: Not on file  . Attends Religious Services: Not on file  . Active Member of Clubs or Organizations: Not on file  . Attends Archivist Meetings: Not on file  . Marital Status: Not on file    Allergies:  Allergies  Allergen Reactions  . Tape Rash    Metabolic Disorder Labs: No results found for: HGBA1C, MPG No results found for: PROLACTIN No results found for: CHOL, TRIG, HDL, CHOLHDL, VLDL, LDLCALC No results found for: TSH  Therapeutic Level Labs: No results found for: LITHIUM No results found for:  VALPROATE No components found for:  CBMZ  Current Medications: Current Outpatient Medications  Medication Sig Dispense Refill  . atorvastatin (LIPITOR) 40 MG tablet TAKE ONE TABLET BY MOUTH EVERY EVENING    . Azelastine-Fluticasone 137-50 MCG/ACT SUSP Place 1 spray into the nose daily.     . calcium carbonate (CALCIUM 600) 600 MG TABS tablet Take 600 mg by mouth 2 (two) times daily.    . Cholecalciferol (VITAMIN D3) 1000 units CAPS Take 1,000 mg by mouth daily.    . citalopram (CELEXA) 20 MG tablet Take 1.5 tablets (30 mg total) by mouth daily. 135 tablet 1  . diclofenac (VOLTAREN) 75 MG EC tablet Take 75 mg by mouth daily.     . fluconazole (DIFLUCAN) 150 MG tablet  Take 150 mg by mouth once.     . fluticasone (FLONASE) 50 MCG/ACT nasal spray Place 1 spray into the nose as needed.    . Fluticasone-Umeclidin-Vilant (TRELEGY ELLIPTA) 100-62.5-25 MCG/INH AEPB Inhale 1 puff into the lungs daily.    Marland Kitchen GLUCOSAMINE SULFATE PO Take 1 capsule by mouth 2 (two) times daily.    . hydrOXYzine (ATARAX/VISTARIL) 25 MG tablet Take 1 tablet (25 mg total) by mouth daily as needed for anxiety. (Patient taking differently: Take 25 mg by mouth every 8 (eight) hours as needed for anxiety. ) 90 tablet 1  . levothyroxine (SYNTHROID, LEVOTHROID) 50 MCG tablet Take 50 mcg by mouth daily.    . Melatonin 5 MG TABS Take 1 tablet by mouth as needed.     . mirabegron ER (MYRBETRIQ) 25 MG TB24 tablet Take by mouth.    . Multiple Vitamin (MULTI-VITAMINS) TABS Take 1 tablet by mouth daily.    . Nebulizers MISC Use    . neomycin-polymyxin b-dexamethasone (MAXITROL) 3.5-10000-0.1 OINT SMARTSIG:1 Sparingly In Eye(s) Every Night    . omeprazole (PRILOSEC) 20 MG capsule Take 20 mg by mouth daily.    Marland Kitchen PATADAY 0.2 % SOLN     . Potassium 99 MG TABS Take 1 tablet by mouth daily.     . Potassium Gluconate 2.5 MEQ TABS Take by mouth.    Marland Kitchen tiZANidine (ZANAFLEX) 2 MG tablet Take by mouth.    . traMADol (ULTRAM) 50 MG tablet Take 50  mg by mouth 2 (two) times daily as needed.    . vitamin B-12 (CYANOCOBALAMIN) 1000 MCG tablet Take 1,000 mcg by mouth daily.    Marland Kitchen acetaminophen (TYLENOL) 500 MG tablet Take by mouth. (Patient not taking: Reported on 10/21/2019)    . albuterol (PROVENTIL HFA;VENTOLIN HFA) 108 (90 Base) MCG/ACT inhaler Inhale 2 puffs into the lungs every 6 (six) hours as needed. (Patient not taking: Reported on 10/21/2019)    . amLODipine (NORVASC) 2.5 MG tablet Take 2.5 mg by mouth daily. (Patient not taking: Reported on 10/21/2019)    . busPIRone (BUSPAR) 7.5 MG tablet Take 1 tablet (7.5 mg total) by mouth 2 (two) times daily. 60 tablet 1  . clobetasol cream (TEMOVATE) 7.04 % Apply 1 application topically as needed.  (Patient not taking: Reported on 10/21/2019)    . clotrimazole-betamethasone (LOTRISONE) cream Apply 1 application topically 2 (two) times daily as needed.  (Patient not taking: Reported on 10/21/2019)    . Melatonin-Pyridoxine 5-1 MG TABS Take by mouth. (Patient not taking: Reported on 10/21/2019)    . mirabegron ER (MYRBETRIQ) 50 MG TB24 tablet Take by mouth. (Patient not taking: Reported on 10/21/2019)    . simvastatin (ZOCOR) 80 MG tablet Take 80 mg by mouth daily. (Patient not taking: Reported on 10/21/2019)     No current facility-administered medications for this visit.     Musculoskeletal: Strength & Muscle Tone: UTA Gait & Station: Uses a cane Patient leans: N/A  Psychiatric Specialty Exam: Review of Systems  Musculoskeletal: Positive for back pain.  Psychiatric/Behavioral: The patient is nervous/anxious.   All other systems reviewed and are negative.   There were no vitals taken for this visit.There is no height or weight on file to calculate BMI.  General Appearance: UTA  Eye Contact:  UTA  Speech:  Clear and Coherent  Volume:  Normal  Mood:  Anxious  Affect:  UTA  Thought Process:  Goal Directed and Descriptions of Associations: Intact  Orientation:  Full (Time, Place, and Person)  Thought Content: Logical   Suicidal Thoughts:  No  Homicidal Thoughts:  No  Memory:  Immediate;   Fair Recent;   Fair Remote;   Fair  Judgement:  Fair  Insight:  Fair  Psychomotor Activity:  UTA  Concentration:  Concentration: Fair and Attention Span: Fair  Recall:  [employer] of Knowledge: Fair  Language: Fair  Akathisia:  No  Handed:  Right  AIMS (if indicated): UTA  Assets:  Communication Skills Desire for Improvement Housing Intimacy Social Support  ADL's:  Intact  Cognition: WNL  Sleep:  Fair   Screenings:   Assessment and Plan: Stanley Helmuth is a 85 year old female, married, lives in an independent senior living community in [place], has a history of depression, anxiety, sleep problems, chronic pain, recent spinal fracture, COPD, breast cancer in remission on tamoxifen, was evaluated by phone today.  Patient continues to struggle with anxiety due to her chronic pain and health issues.  She will benefit from the following medication changes.  Plan MDD in full remission Celexa 30 mg p.o. daily   GAD-unstable Celexa 30 mg p.o. daily Start BuSpar 7.5 mg p.o. twice daily Hydroxyzine 25 to 50 mg p.o. daily as needed.  Insomnia-stable Patient does have chronic pain which does affect her sleep however overall she is sleeping okay.  Will monitor closely  Follow-up in clinic in 1 month or sooner if needed.  I have spent atleast 20 minutes non face to face with patient today. More than 50 % of the time was spent for preparing to see the patient ( e.g., review of test, records ), ordering medications and test ,psychoeducation and supportive psychotherapy and care coordination,as well as documenting clinical information in electronic health record. This note was generated in part or whole with voice recognition software. Voice recognition is usually quite accurate but there are transcription errors that can and very often do occur. I apologize for any typographical  errors that were not detected and corrected.       Ursula Alert, Julie Mckinney 10/21/2019, 3:18 PM

## 2019-10-21 NOTE — Patient Instructions (Signed)
Buspirone tablets What is this medicine? BUSPIRONE (byoo SPYE rone) is used to treat anxiety disorders. This medicine may be used for other purposes; ask your health care provider or pharmacist if you have questions. COMMON BRAND NAME(S): BuSpar What should I tell my health care provider before I take this medicine? They need to know if you have any of these conditions:  kidney or liver disease  an unusual or allergic reaction to buspirone, other medicines, foods, dyes, or preservatives  pregnant or trying to get pregnant  breast-feeding How should I use this medicine? Take this medicine by mouth with a glass of water. Follow the directions on the prescription label. You may take this medicine with or without food. To ensure that this medicine always works the same way for you, you should take it either always with or always without food. Take your doses at regular intervals. Do not take your medicine more often than directed. Do not stop taking except on the advice of your doctor or health care professional. Talk to your pediatrician regarding the use of this medicine in children. Special care may be needed. Overdosage: If you think you have taken too much of this medicine contact a poison control center or emergency room at once. NOTE: This medicine is only for you. Do not share this medicine with others. What if I miss a dose? If you miss a dose, take it as soon as you can. If it is almost time for your next dose, take only that dose. Do not take double or extra doses. What may interact with this medicine? Do not take this medicine with any of the following medications:  linezolid  MAOIs like Carbex, Eldepryl, Marplan, Nardil, and Parnate  methylene blue  procarbazine This medicine may also interact with the following medications:  diazepam  digoxin  diltiazem  erythromycin  grapefruit juice  haloperidol  medicines for mental depression or mood problems  medicines  for seizures like carbamazepine, phenobarbital and phenytoin  nefazodone  other medications for anxiety  rifampin  ritonavir  some antifungal medicines like itraconazole, ketoconazole, and voriconazole  verapamil  warfarin This list may not describe all possible interactions. Give your health care provider a list of all the medicines, herbs, non-prescription drugs, or dietary supplements you use. Also tell them if you smoke, drink alcohol, or use illegal drugs. Some items may interact with your medicine. What should I watch for while using this medicine? Visit your doctor or health care professional for regular checks on your progress. It may take 1 to 2 weeks before your anxiety gets better. You may get drowsy or dizzy. Do not drive, use machinery, or do anything that needs mental alertness until you know how this drug affects you. Do not stand or sit up quickly, especially if you are an older patient. This reduces the risk of dizzy or fainting spells. Alcohol can make you more drowsy and dizzy. Avoid alcoholic drinks. What side effects may I notice from receiving this medicine? Side effects that you should report to your doctor or health care professional as soon as possible:  blurred vision or other vision changes  chest pain  confusion  difficulty breathing  feelings of hostility or anger  muscle aches and pains  numbness or tingling in hands or feet  ringing in the ears  skin rash and itching  vomiting  weakness Side effects that usually do not require medical attention (report to your doctor or health care professional if they continue or   are bothersome):  disturbed dreams, nightmares  headache  nausea  restlessness or nervousness  sore throat and nasal congestion  stomach upset This list may not describe all possible side effects. Call your doctor for medical advice about side effects. You may report side effects to FDA at 1-800-FDA-1088. Where should I  keep my medicine? Keep out of the reach of children. Store at room temperature below 30 degrees C (86 degrees F). Protect from light. Keep container tightly closed. Throw away any unused medicine after the expiration date. NOTE: This sheet is a summary. It may not cover all possible information. If you have questions about this medicine, talk to your doctor, pharmacist, or health care provider.  2020 Elsevier/Gold Standard (2009-08-17 18:06:11)  

## 2019-11-04 ENCOUNTER — Telehealth: Payer: Self-pay

## 2019-11-04 NOTE — Telephone Encounter (Signed)
Called to confirm appt. No answer and no AM. Telephone rung approx 25 times.

## 2019-11-08 ENCOUNTER — Other Ambulatory Visit: Payer: Self-pay

## 2019-11-08 ENCOUNTER — Encounter: Payer: Self-pay | Admitting: Student in an Organized Health Care Education/Training Program

## 2019-11-08 ENCOUNTER — Ambulatory Visit
Admission: RE | Admit: 2019-11-08 | Discharge: 2019-11-08 | Disposition: A | Discharge status: Home / Self Care | Payer: Medicare Other | Source: Ambulatory Visit | Attending: Student in an Organized Health Care Education/Training Program | Admitting: Student in an Organized Health Care Education/Training Program

## 2019-11-08 ENCOUNTER — Ambulatory Visit (HOSPITAL_BASED_OUTPATIENT_CLINIC_OR_DEPARTMENT_OTHER): Payer: Medicare Other | Admitting: Student in an Organized Health Care Education/Training Program

## 2019-11-08 VITALS — BP 144/73 | HR 71 | Temp 97.9°F | Resp 16 | Ht 60.0 in | Wt 112.0 lb

## 2019-11-08 DIAGNOSIS — G894 Chronic pain syndrome: Secondary | ICD-10-CM

## 2019-11-08 DIAGNOSIS — M5416 Radiculopathy, lumbar region: Secondary | ICD-10-CM | POA: Diagnosis present

## 2019-11-08 DIAGNOSIS — G8929 Other chronic pain: Secondary | ICD-10-CM | POA: Insufficient documentation

## 2019-11-08 DIAGNOSIS — M792 Neuralgia and neuritis, unspecified: Secondary | ICD-10-CM | POA: Insufficient documentation

## 2019-11-08 DIAGNOSIS — M961 Postlaminectomy syndrome, not elsewhere classified: Secondary | ICD-10-CM | POA: Diagnosis present

## 2019-11-08 NOTE — Progress Notes (Signed)
Safety precautions to be maintained throughout the outpatient stay will include: orient to surroundings, keep bed in low position, maintain call bell within reach at all times, provide assistance with transfer out of bed and ambulation.  

## 2019-11-08 NOTE — Progress Notes (Signed)
Patient: Julie Mckinney  Service Category: E/M  Provider: Gillis Santa, MD  DOB: [birth year]  DOS: 11/08/2019  Referring Provider: Meade Maw, MD  MRN: [id number]  Setting: Ambulatory outpatient  PCP: Dion Body, MD  Type: New Patient  Specialty: Interventional Pain Management    Location: Office  Delivery: Face-to-face     Primary Reason(s) for Visit: Encounter for initial evaluation of one or more chronic problems (new to examiner) potentially causing chronic pain, and posing a threat to normal musculoskeletal function. (Level of risk: High) CC: Back Pain (lower)  HPI  Julie Mckinney is a 85 y.o. year old, female patient, who comes for the first time to our practice referred by Meade Maw, MD for our initial evaluation of her chronic pain. She has Acquired hypothyroidism; Chronic obstructive pulmonary disease ([place]); Depression, major, single episode, complete remission ([place]); Gastroesophageal reflux disease without esophagitis; History of diarrhea; Chronic radicular lumbar pain; Osteopenia of multiple sites; Weight loss, abnormal; Abnormal CT scan, colon; Acute blood loss anemia; Anemia; Breast cancer of upper-outer quadrant of right female breast ([place]); Diarrhea of infectious origin; History of CVA (cerebrovascular accident) without residual deficits; Postoperative CSF leak; Pure hypercholesterolemia; Vitamin B 12 deficiency; H/O orthostatic hypotension; Essential hypertension; History of right breast cancer; Medicare annual wellness visit, initial; MDD (major depressive disorder), recurrent episode, mild ([place]); GAD (generalized anxiety disorder); Insomnia due to medical condition; H/O macrocytic anemia; DNR (do not resuscitate); MDD (major depressive disorder), recurrent, in partial remission ([place]); T10 vertebral fracture ([place]); Closed stable burst fracture of T12 vertebra ([place]); Fall; Other hyperlipidemia; Hammer toes, bilateral; Pain due to onychomycosis of toenails of both  feet; Lumbar post-laminectomy syndrome; and Intractable neuropathic pain of left foot on their problem list. Today she comes in for evaluation of her Back Pain (lower)  Pain Assessment: Location: Right, Left, Lower Back Radiating: down left leg to toes; down right leg to ankle Onset: More than a month ago Duration: Chronic pain Quality: Aching, Burning Severity: 9 /10 (subjective, self-reported pain score)  Effect on ADL: limits daily activities Timing: Constant Modifying factors: biofreeze, Tramadol, reclining, heat BP: (!) 144/73  HR: 71  Onset and Duration: Present longer than 3 months Cause of pain: fall; 01/15/2019 Severity: No change since onset, NAS-11 at its worse: 8/10, NAS-11 now: 8/10 and NAS-11 on the average: 8/10 Timing: Not influenced by the time of the day Aggravating Factors: Bending, Kneeling, Lifiting, Motion, Prolonged sitting, Prolonged standing, Squatting, Stooping , Twisting and Walking Alleviating Factors: Hot packs, Lying down, Resting and Sleeping Associated Problems: Depression, Dizziness, Fatigue, Numbness, Sadness, Swelling, Temperature changes and Tingling Quality of Pain: Aching, Agonizing, Annoying, Constant, Disabling, Distressing, Exhausting, Horrible, Nagging, Pressure-like, Sharp, Shooting, Tender, Throbbing, Tingling and Uncomfortable Previous Examinations or Tests: CT scan, MRI scan, Nerve block and X-rays Previous Treatments: Epidural steroid injections  Julie Mckinney is a pleasant 85 year old female who is being referred by Dr. Cari Caraway for consideration of spinal cord stimulator trial.  Of note patient has a history of L4-L5 fusion in 2019 by Dr. Cari Caraway due to grade 2 anterior listhesis of L4 and L5 and severe central canal stenosis.  She had been doing well after her surgery until she fell and suffered a T10 burst fracture on 01/15/2019.  This is impacted her activity level and she can no longer walk her dogs which she used to walk 4 times a day.   The majority of her pain is in her low back with radiation into left buttock posterior lateral thigh and posterior lateral calf in  a dermatomal distribution.  Her pain improved somewhat with lying down.  Please see interventional injection history as below.  Her most recent injections did not provide any benefit.  This was done in July 2021.  Of note the patient's husband has a spinal cord stimulator and did well with this.  Her husband is a patient of mine at the pain clinic.  Of note, patient has done physical therapy in the past.  She tries to do home stretching exercises.   07/23/2019: Left S1 transforaminal ESI (minimal relief) 06/25/2019: Left S1 transforaminal ESI (30% relief) 10/30/2016: Bilateral S1 TF ESI (mild temporary relief) 10/09/2016: Bilateral S1 TF ESI   Meds   Current Outpatient Medications:  .  acetaminophen (TYLENOL) 500 MG tablet, Take by mouth. , Disp: , Rfl:  .  albuterol (PROVENTIL HFA;VENTOLIN HFA) 108 (90 Base) MCG/ACT inhaler, Inhale 2 puffs into the lungs every 6 (six) hours as needed. , Disp: , Rfl:  .  atorvastatin (LIPITOR) 40 MG tablet, TAKE ONE TABLET BY MOUTH EVERY EVENING, Disp: , Rfl:  .  Azelastine-Fluticasone 137-50 MCG/ACT SUSP, Place 1 spray into the nose daily. , Disp: , Rfl:  .  busPIRone (BUSPAR) 7.5 MG tablet, Take 1 tablet (7.5 mg total) by mouth 2 (two) times daily., Disp: 60 tablet, Rfl: 1 .  calcium carbonate (CALCIUM 600) 600 MG TABS tablet, Take 600 mg by mouth 2 (two) times daily., Disp: , Rfl:  .  citalopram (CELEXA) 20 MG tablet, Take 1.5 tablets (30 mg total) by mouth daily., Disp: 135 tablet, Rfl: 1 .  diclofenac (VOLTAREN) 75 MG EC tablet, Take 75 mg by mouth daily. , Disp: , Rfl:  .  fluconazole (DIFLUCAN) 150 MG tablet, Take 150 mg by mouth once. , Disp: , Rfl:  .  fluticasone (FLONASE) 50 MCG/ACT nasal spray, Place 1 spray into the nose as needed., Disp: , Rfl:  .  Fluticasone-Umeclidin-Vilant (TRELEGY ELLIPTA) 100-62.5-25 MCG/INH  AEPB, Inhale 1 puff into the lungs daily., Disp: , Rfl:  .  GLUCOSAMINE SULFATE PO, Take 1 capsule by mouth 2 (two) times daily., Disp: , Rfl:  .  hydrOXYzine (ATARAX/VISTARIL) 25 MG tablet, Take 1 tablet (25 mg total) by mouth daily as needed for anxiety. (Patient taking differently: Take 25 mg by mouth every 8 (eight) hours as needed for anxiety. ), Disp: 90 tablet, Rfl: 1 .  levothyroxine (SYNTHROID, LEVOTHROID) 50 MCG tablet, Take 50 mcg by mouth daily., Disp: , Rfl:  .  Multiple Vitamin (MULTI-VITAMINS) TABS, Take 1 tablet by mouth daily., Disp: , Rfl:  .  Nebulizers MISC, Use, Disp: , Rfl:  .  neomycin-polymyxin b-dexamethasone (MAXITROL) 3.5-10000-0.1 OINT, SMARTSIG:1 Sparingly In Eye(s) Every Night, Disp: , Rfl:  .  omeprazole (PRILOSEC) 20 MG capsule, Take 20 mg by mouth daily., Disp: , Rfl:  .  PATADAY 0.2 % SOLN, , Disp: , Rfl:  .  Potassium 99 MG TABS, Take 1 tablet by mouth daily. , Disp: , Rfl:  .  Potassium Gluconate 2.5 MEQ TABS, Take by mouth., Disp: , Rfl:  .  traMADol (ULTRAM) 50 MG tablet, Take 50 mg by mouth 2 (two) times daily as needed., Disp: , Rfl:  .  vitamin B-12 (CYANOCOBALAMIN) 1000 MCG tablet, Take 1,000 mcg by mouth daily., Disp: , Rfl:  .  amLODipine (NORVASC) 2.5 MG tablet, Take 2.5 mg by mouth daily. (Patient not taking: Reported on 11/08/2019), Disp: , Rfl:  .  Cholecalciferol (VITAMIN D3) 1000 units CAPS, Take 1,000 mg by mouth  daily. (Patient not taking: Reported on 11/08/2019), Disp: , Rfl:  .  clobetasol cream (TEMOVATE) 0.17 %, Apply 1 application topically as needed.  (Patient not taking: Reported on 10/21/2019), Disp: , Rfl:  .  clotrimazole-betamethasone (LOTRISONE) cream, Apply 1 application topically 2 (two) times daily as needed.  (Patient not taking: Reported on 10/21/2019), Disp: , Rfl:  .  Melatonin 5 MG TABS, Take 1 tablet by mouth as needed.  (Patient not taking: Reported on 11/08/2019), Disp: , Rfl:  .  Melatonin-Pyridoxine 5-1 MG TABS, Take by  mouth. (Patient not taking: Reported on 10/21/2019), Disp: , Rfl:  .  mirabegron ER (MYRBETRIQ) 25 MG TB24 tablet, Take by mouth. (Patient not taking: Reported on 11/08/2019), Disp: , Rfl:  .  mirabegron ER (MYRBETRIQ) 50 MG TB24 tablet, Take by mouth. (Patient not taking: Reported on 10/21/2019), Disp: , Rfl:  .  simvastatin (ZOCOR) 80 MG tablet, Take 80 mg by mouth daily. (Patient not taking: Reported on 10/21/2019), Disp: , Rfl:  .  tiZANidine (ZANAFLEX) 2 MG tablet, Take by mouth. (Patient not taking: Reported on 11/08/2019), Disp: , Rfl:   Imaging Review  Cervical Imaging:  Narrative CLINICAL DATA:  Patient fell 2 days ago. Head injury during the fall.  EXAM: CT HEAD WITHOUT CONTRAST  CT CERVICAL SPINE WITHOUT CONTRAST  TECHNIQUE: Multidetector CT imaging of the head and cervical spine was performed following the standard protocol without intravenous contrast. Multiplanar CT image reconstructions of the cervical spine were also generated.  COMPARISON:  None.  FINDINGS: CT HEAD FINDINGS  Brain: There is no evidence for acute hemorrhage, hydrocephalus, mass lesion, or abnormal extra-axial fluid collection. No definite CT evidence for acute infarction. Old lacunar infarct noted left cerebellar hemisphere. Diffuse loss of parenchymal volume is consistent with atrophy. Patchy low attenuation in the deep hemispheric and periventricular white matter is nonspecific, but likely reflects chronic microvascular ischemic demyelination.  Vascular: No hyperdense vessel or unexpected calcification.  Skull: No evidence for fracture. No worrisome lytic or sclerotic lesion.  Sinuses/Orbits: The visualized paranasal sinuses and mastoid air cells are clear. Visualized portions of the globes and intraorbital fat are unremarkable.  Other: None.  CT CERVICAL SPINE FINDINGS  Alignment: Mild reversal of normal cervical lordosis. There is 3 mm anterolisthesis of C3 on 4 and 2 mm  anterolisthesis of C4 on 5, likely related to the associated loss of facet space.  Skull base and vertebrae: No acute fracture. No primary bone lesion or focal pathologic process.  Soft tissues and spinal canal: No prevertebral fluid or swelling. No visible canal hematoma.  Disc levels: Loss of disc height with endplate degeneration noted C5-6 and C6-7.  Upper chest: 6 mm pulmonary nodule incompletely visualized in the medial left upper lobe.  Other: None.  IMPRESSION: 1. No acute intracranial abnormality. Atrophy with chronic small vessel white matter ischemic disease. 2. No cervical spine fracture. 3. Mild reversal of normal cervical lordosis with mild anterolisthesis of C3 on 4 and C4 on 5. This is probably related to the prominent loss of facet space at the same levels, but ligamentous injury could have this appearance. Cervical spine MRI could be used to further evaluate as clinically warranted. 4. 6 mm nodule in the medial left upper lobe has been incompletely visualized. Follow-up CT chest without contrast recommended to further assess and establish baseline follow-up recommendations.   Electronically Signed By: Misty Stanley M.D. On: 01/17/2019 11:45    DG Shoulder Left  Narrative CLINICAL DATA:  Status post fall.  EXAM: LEFT SHOULDER - 2+ VIEW  COMPARISON:  January 17, 2019  FINDINGS: There is no evidence of fracture or dislocation. Moderate severity degenerative changes seen involving the left acromioclavicular joint. Soft tissues are unremarkable.  IMPRESSION: No acute osseous abnormality.   Electronically Signed By: Virgina Norfolk M.D. On: 07/14/2019 16:03   Narrative CLINICAL DATA:  85 year old female status post fall in December with persistent mid and low back pain. T10 compression fracture at that time. Left greater than right upper extremity bilateral lower extremity pain.  EXAM: MRI THORACIC SPINE WITHOUT  CONTRAST  TECHNIQUE: Multiplanar, multisequence MR imaging of the thoracic spine was performed. No intravenous contrast was administered.  COMPARISON:  Thoracic spine CT 01/17/2019.  FINDINGS: Limited cervical spine imaging: Mild degenerative appearing spondylolisthesis. Mild reversal of cervical lordosis.  Thoracic spine segmentation: Normal, the same numbering system on the prior CT.  Alignment: Thoracic kyphosis has not significantly changed. Mild underlying thoracic scoliosis better demonstrated previously.  Vertebrae: T10 compression fracture has progressed to vertebra plana, with mild retropulsion of bone (series 19, image 10) further detailed below. There does appear to be faint residual marrow edema at that level more so on the left (series 21, image 14).  A mild T5 superior endplate compression has occurred since December, but appears chronic without marrow edema.  Other thoracic levels appear stable and intact. There is a benign hemangioma in the right T2 body and pedicle.  Visible lumbar levels appear grossly intact. No other marrow edema or evidence of acute osseous abnormality.  Cord: Capacious thoracic spinal canal at most levels. No spinal cord signal abnormality despite spinal stenosis at T10 detailed below.  Paraspinal and other soft tissues: Stable visible thoracic and upper abdominal viscera, including benign appearing exophytic right upper pole renal cyst. Thoracic paraspinal soft tissues are within normal limits.  Disc levels:  Degenerative changes without significant spinal or foraminal stenosis from the T1 to the T6 level.  T7-T8: Disc space loss with broad-based posterior disc bulge or extrusion. Mild posterior element hypertrophy. Borderline to mild spinal stenosis.  T8-T9: Disc space loss with circumferential disc bulge. Moderate posterior element hypertrophy. Mild spinal and mild to moderate right T8 foraminal stenosis.  T9-T10:  Circumferential disc osteophyte complex in part related to mild retropulsion of the T10 posterosuperior endplate. Moderate posterior element hypertrophy. Borderline to mild spinal and bilateral T9 foraminal stenosis.  T10-T11: Retropulsion of the T10 posteroinferior endplate and facet hypertrophy resulting in mild spinal stenosis and mild cord mass effect. No cord signal abnormality. There is moderate to severe T10 neural foraminal stenosis greater on the right.  T11-T12 and T12-L1 appear negative.  IMPRESSION: 1. T10 compression fracture has progressed to vertebra plana since December, with faint residual marrow edema. Mild retropulsion of bone results in mild spinal stenosis with mild cord mass effect, but no cord signal abnormality, and moderate to severe T10 neural foraminal stenosis greater on the right.  2. A mild T5 superior endplate compression fracture has also occurred since December, but is now chronic. No marrow edema.  3. Other thoracic levels are stable.  4. Superimposed thoracic spine degeneration resulting in multifactorial mild thoracic spinal stenosis also at T7-T8 through T9-T10. Up to moderate right T8 and bilateral T9 neural foraminal stenosis.   Electronically Signed By: Genevie Ann M.D. On: 09/16/2019 07:26  Lumbosacral Imaging: Lumbar MR wo contrast: Results for orders placed during the hospital encounter of 08/28/17  MR LUMBAR SPINE WO CONTRAST  Narrative CLINICAL DATA:  Chronic right-sided low  back pain with right leg numbness. Prior lower lumbar fusion.  EXAM: MRI LUMBAR SPINE WITHOUT CONTRAST  TECHNIQUE: Multiplanar, multisequence MR imaging of the lumbar spine was performed. No intravenous contrast was administered.  COMPARISON:  MRI lumbar spine dated January 09, 2017.  FINDINGS: Segmentation:  Standard.  Alignment: Slightly improved anterolisthesis at L4-L5, now measuring 6 mm, previously 9 mm. Unchanged 2 mm anterolisthesis at  L5-S1.  Vertebrae: Interval L4-S1 PLIF. Mild edema within the left greater than right sacrum adjacent to the S1 pedicle screws. No evidence of discitis or bone lesion.  Conus medullaris and cauda equina: Conus extends to the L1-L2 level. Conus and cauda equina appear normal.  Paraspinal and other soft tissues: There is a 2.0 x 1.1 x 4.5 cm fluid collection along the right lamina, extending from L2-L3 through L4-L5, presumably a postsurgical seroma. Additional 1.2 x 2.2 x 7.3 cm fluid collection in the midline superficial soft tissues, also presumably a postsurgical seroma.  Unchanged bilateral renal cysts.  Disc levels:  T9-T10 to L1-L2:  Negative.  L2-L3:  Unchanged small diffuse disc bulge.  No stenosis.  L3-L4: Unchanged diffuse disc bulge and mild bilateral facet arthropathy with ligamentum flavum hypertrophy. Stable mild bilateral lateral recess stenosis. No spinal canal or neuroforaminal stenosis.  L4-L5: Interval right laminectomy, facetectomy, and PLIF. Improved patency of the spinal canal with residual mild central spinal canal stenosis and bilateral neuroforaminal stenosis.  L5-S1: Interval right laminectomy, facetectomy, and PLIF. Susceptibility artifact and limits evaluation of the spinal canal on the axial views, but there appears to be mild spinal canal stenosis based on the sagittal views. Unchanged mild bilateral neuroforaminal stenosis. There is some infiltration of fat surrounding exiting right L5 nerve root in the proximal neural foramen which could reflect postsurgical scar.  IMPRESSION: 1. Interval L4-S1 PLIF with likely subacute fractures of the bilateral sacrum adjacent to the S1 pedicle screws. 2. Improved patency of the spinal canal at L4-L5 with mild residual spinal canal and bilateral neuroforaminal stenosis. 3. Limited evaluation of the spinal canal at L5-S1 on the axial images, with at least mild residual spinal canal stenosis based on the  sagittal views. The spinal canal may be better evaluated with CT lumbar myelogram. 4. Infiltration of the fat surrounding the exiting right L5 nerve root in the proximal neural foramen could reflect postsurgical scar. 5. Postoperative fluid collections along the right lamina from L2-L3 through L4-L5 and in the midline superficial soft tissues, presumably seromas.   Electronically Signed By: Titus Dubin M.D. On: 08/29/2017 10:01  Narrative CLINICAL DATA:  85 year old female status post fall 2 days ago. Continued severe low back pain radiating to the left hip.  EXAM: CT LUMBAR SPINE WITHOUT CONTRAST  TECHNIQUE: Multidetector CT imaging of the lumbar spine was performed without intravenous contrast administration. Multiplanar CT image reconstructions were also generated.  COMPARISON:  CT lumbar spine and MRI 08/28/2017.  FINDINGS: Segmentation: Normal, the same numbering system used previously.  Alignment: Chronically exaggerated lower lumbar lordosis with grade 1 anterolisthesis of L4 on L5 is stable since 2019.  Vertebrae: Osteopenia.  The T10 inferior endplate appeared normal by MRI in 2019. There is a faintly visible inferior endplate compression deformity now (series 7, image 54). The visible T10 posterior elements appear to remain intact. There is mild retropulsion of the posteroinferior endplate, with new mild T10-T11 level spinal stenosis.  The T11 and T12 vertebrae appear intact. Lumbar levels appear stable and intact.  Evidence of healed bilateral S1 sacral ala fractures since  2019. No definite acute sacral fracture, and the SI joints appear intact.  Paraspinal and other soft tissues: Stable noncontrast visible abdominal viscera. Moderate size gastric hiatal hernia. Grossly negative costophrenic angles. Mild Aortoiliac calcified atherosclerosis. Diverticulosis of the sigmoid colon. Postoperative changes to the posterior paraspinal soft tissues with no  adverse features.  Disc levels: Mild for age lumbar spine degeneration above L3-L4.  L3-L4: Posterior disc space loss and mild vacuum disc with suspected circumferential disc bulge. Up to moderate posterior element hypertrophy. This level is not significantly changed.  L4-L5: Prior decompression and fusion. L4 pedicle screws appear intact without loosening. L4-L5 interbody implant with stable mild endplate subsidence. No convincing arthrodesis.  L5-S1: Prior decompression and fusion. Chronic loosening of the S1 screws. Unilateral left L5 pedicle screw appears intact without loosening. L5-S1 interbody implant with chronic L5 endplate subsidence. Residual posterior element hypertrophy on the left. No convincing arthrodesis.  IMPRESSION: 1. Osteopenia. Partially visible and age indeterminate T10 inferior endplate compression fracture is new since August 2019. There is mild associated retropulsion and mild new spinal stenosis at that level. If specific therapy is desired, Thoracic MRI without contrast or Nuclear Medicine Whole-body Bone Scan would best determine acuity and confirm candidacy for vertebroplasty.  2. Healed bilateral S1 sacral ala fractures since 2019 with no acute osseous abnormality identified in the lumbosacral spine.  3. Otherwise stable postoperative changes at L4-L5 and L5-S1 with chronic loosening of the S1 pedicle screws and no convincing arthrodesis.  4. Hiatal hernia.  Sigmoid colon diverticulosis.   Electronically Signed By: Genevie Ann M.D. On: 01/17/2019 11:45  DG Hip Unilat W or Wo Pelvis 2-3 Views Left  Narrative CLINICAL DATA:  Status post fall.  EXAM: DG HIP (WITH OR WITHOUT PELVIS) 2-3V LEFT  COMPARISON:  January 17, 2019  FINDINGS: Radiopaque intramedullary rod and fixation screws are seen within the proximal femurs, bilaterally. These are seen on the prior study. There is no evidence of an acute hip fracture or dislocation.  Mild degenerative changes seen involving the bilateral hips. Bilateral radiopaque pedicle screws are seen at the levels of L5 and S1 with radiopaque operative material seen at the level of L5-S1. This is also seen on the prior exam.  IMPRESSION: Postoperative changes, as described above, without evidence of acute osseous abnormality.   Electronically Signed By: Virgina Norfolk M.D. On: 07/14/2019 16:02  Hand Imaging: Hand-R DG Complete: Results for orders placed during the hospital encounter of 11/19/16  DG Hand Complete Right  Narrative CLINICAL DATA:  Initial encounter for States this AM she tripped and fell down, states right pinky pain and swelling with some bruising, states she also hit the right side of her head, denies any LOC, denies any use of blood thinners, awake and alert, ambulatory  EXAM: RIGHT HAND - COMPLETE 3+ VIEW  COMPARISON:  None.  FINDINGS: A minimally angulated, minimally displaced fracture involves the proximal portion the proximal phalanx of the fifth digit. Moderate soft tissue swelling in this area. Extensive degenerative changes throughout. An oblique component extends into the more distal portion of the proximal phalanx. No intra-articular extension.  IMPRESSION: Proximal phalangeal fracture, fifth digit.   Electronically Signed By: Abigail Miyamoto M.D. On: 11/19/2016 13:46   Complexity Note: Imaging results reviewed. Results shared with [name], using Layman's terms.                         ROS  Cardiovascular: Needs antibiotics prior to dental procedures  Pulmonary or Respiratory: Wheezing and difficulty taking a deep full breath (Asthma), Difficulty blowing air out (Emphysema) and Snoring  Neurological: No reported neurological signs or symptoms such as seizures, abnormal skin sensations, urinary and/or fecal incontinence, being born with an abnormal open spine and/or a tethered spinal cord Psychological-Psychiatric: Anxiousness  and Depressed Gastrointestinal: Irregular, infrequent bowel movements (Constipation) Genitourinary: Recurrent Urinary Tract infections Hematological: Brusing easily Endocrine: Thyroid Disease Rheumatologic: Joint aches and or swelling due to excess weight (Osteoarthritis) and Constant unexplained fatigue (Chronic Fatigue Syndrome) Musculoskeletal: Negative for myasthenia gravis, muscular dystrophy, multiple sclerosis or malignant hyperthermia Work History: Retired  Allergies  Ms. Renda is allergic to tape.  Laboratory Chemistry Profile   Renal Lab Results  Component Value Date   BUN 27 (H) 01/19/2019   CREATININE 0.56 01/19/2019   GFRAA >60 01/19/2019   GFRNONAA >60 01/19/2019   PROTEINUR NEGATIVE 01/17/2019     Electrolytes Lab Results  Component Value Date   NA 142 01/19/2019   K 4.0 01/19/2019   CL 111 01/19/2019   CALCIUM 8.5 (L) 01/19/2019     Hepatic Lab Results  Component Value Date   AST 20 01/17/2019   ALT 18 01/17/2019   ALBUMIN 3.5 01/17/2019   ALKPHOS 34 (L) 01/17/2019     ID Lab Results  Component Value Date   SARSCOV2NAA NEGATIVE 01/17/2019     Bone No results found for: VD25OH, [id number], [id number], [id number], 25OHVITD1, 25OHVITD2, 25OHVITD3, TESTOFREE, TESTOSTERONE   Endocrine Lab Results  Component Value Date   GLUCOSE 104 (H) 01/19/2019   GLUCOSEU NEGATIVE 01/17/2019     Neuropathy Lab Results  Component Value Date   VITAMINB12 2,941 (H) 03/16/2019   FOLATE 41.0 03/16/2019     CNS No results found for: COLORCSF, APPEARCSF, RBCCOUNTCSF, WBCCSF, POLYSCSF, LYMPHSCSF, EOSCSF, PROTEINCSF, GLUCCSF, JCVIRUS, CSFOLI, IGGCSF, LABACHR, ACETBL, LABACHR, ACETBL   Inflammation (CRP: Acute  ESR: Chronic) No results found for: CRP, ESRSEDRATE, LATICACIDVEN   Rheumatology No results found for: RF, ANA, LABURIC, URICUR, LYMEIGGIGMAB, LYMEABIGMQN, HLAB27   Coagulation Lab Results  Component Value Date   PLT 233 03/16/2019      Cardiovascular Lab Results  Component Value Date   HGB 12.1 03/16/2019   HCT 39.0 03/16/2019     Screening Lab Results  Component Value Date   SARSCOV2NAA NEGATIVE 01/17/2019     Cancer No results found for: CEA, CA125, LABCA2   Allergens No results found for: ALMOND, APPLE, ASPARAGUS, AVOCADO, BANANA, BARLEY, BASIL, BAYLEAF, GREENBEAN, LIMABEAN, WHITEBEAN, BEEFIGE, REDBEET, BLUEBERRY, BROCCOLI, CABBAGE, MELON, CARROT, CASEIN, CASHEWNUT, CAULIFLOWER, CELERY     Note: Lab results reviewed.  [place]  Drug: Julie Mckinney  reports no history of drug use. Alcohol:  reports current alcohol use of about 1.0 standard drink of alcohol per week. Tobacco:  reports that she quit smoking about 24 years ago. Her smoking use included cigarettes. She has a 15.00 pack-year smoking history. She has never used smokeless tobacco. Medical:  has a past medical history of Anxiety, Asthma, Breast cancer ([place]) (Right), COPD (chronic obstructive pulmonary disease) ([place]), Depression, and Personal history of radiation therapy. Family: family history includes Cancer in her father.  Past Surgical History:  Procedure Laterality Date  . ABDOMINAL HYSTERECTOMY    . APPENDECTOMY    . BACK SURGERY    . BREAST BIOPSY Right 2015   +  . BREAST EXCISIONAL BIOPSY Right 1975   neg  . BREAST LUMPECTOMY Right 2015   invasive lobular carcinoma  . CHOLECYSTECTOMY    .  COLONOSCOPY     Scheduled for July 2018  . COLONOSCOPY WITH PROPOFOL N/A 08/14/2016   Procedure: COLONOSCOPY WITH PROPOFOL;  Surgeon: Manya Silvas, MD;  Location[facility] ENDOSCOPY;  Service: Endoscopy;  Laterality: N/A;  . ESOPHAGOGASTRODUODENOSCOPY (EGD) WITH PROPOFOL N/A 08/14/2016   Procedure: ESOPHAGOGASTRODUODENOSCOPY (EGD) WITH PROPOFOL;  Surgeon: Manya Silvas, MD;  Location: [facility] ENDOSCOPY;  Service: Endoscopy;  Laterality: N/A;   Active Ambulatory Problems    Diagnosis Date Noted  . Acquired hypothyroidism 07/02/2016  . Chronic  obstructive pulmonary disease ([place]) 07/02/2016  . Depression, major, single episode, complete remission ([place]) 07/02/2016  . Gastroesophageal reflux disease without esophagitis 07/02/2016  . History of diarrhea 06/10/2016  . Chronic radicular lumbar pain 07/02/2016  . Osteopenia of multiple sites 07/02/2016  . Weight loss, abnormal 06/10/2016  . Abnormal CT scan, colon 06/10/2016  . Acute blood loss anemia 05/13/2017  . Anemia 05/02/2017  . Breast cancer of upper-outer quadrant of right female breast ([place]) 11/02/2013  . Diarrhea of infectious origin 09/12/2016  . History of CVA (cerebrovascular accident) without residual deficits 05/12/2017  . Postoperative CSF leak 05/13/2017  . Pure hypercholesterolemia 07/02/2016  . Vitamin B 12 deficiency 10/28/2016  . H/O orthostatic hypotension 02/05/2018  . Essential hypertension 02/05/2018  . History of right breast cancer 11/02/2013  . Medicare annual wellness visit, initial 08/04/2018  . MDD (major depressive disorder), recurrent episode, mild ([place]) 09/22/2018  . GAD (generalized anxiety disorder) 09/22/2018  . Insomnia due to medical condition 09/22/2018  . H/O macrocytic anemia 12/08/2018  . DNR (do not resuscitate) 12/08/2018  . MDD (major depressive disorder), recurrent, in partial remission ([place]) 01/06/2019  . T10 vertebral fracture ([place]) 01/17/2019  . Closed stable burst fracture of T12 vertebra ([place])   . Fall   . Other hyperlipidemia   . Hammer toes, bilateral 05/17/2019  . Pain due to onychomycosis of toenails of both feet 05/17/2019  . Lumbar post-laminectomy syndrome 11/08/2019  . Intractable neuropathic pain of left foot 11/08/2019   Resolved Ambulatory Problems    Diagnosis Date Noted  . No Resolved Ambulatory Problems   Past Medical History:  Diagnosis Date  . Anxiety   . Asthma   . Breast cancer ([place]) Right  . COPD (chronic obstructive pulmonary disease) ([place])   . Depression   . Personal history of radiation  therapy    Constitutional Exam  General appearance: Well nourished, well developed, and well hydrated. In no apparent acute distress Vitals:   11/08/19 1244  BP: (!) 144/73  Pulse: 71  Resp: 16  Temp: 97.9 F (36.6 C)  TempSrc: Temporal  Weight: 112 lb (50.8 kg)  Height: 5' (1.524 m)   BMI Assessment: Estimated body mass index is 21.87 kg/m as calculated from the following:   Height as of this encounter: 5' (1.524 m).   Weight as of this encounter: 112 lb (50.8 kg).  BMI interpretation table: BMI level Category Range association with higher incidence of chronic pain  <18 kg/m2 Underweight   18.5-24.9 kg/m2 Ideal body weight   25-29.9 kg/m2 Overweight Increased incidence by 20%  30-34.9 kg/m2 Obese (Class I) Increased incidence by 68%  35-39.9 kg/m2 Severe obesity (Class II) Increased incidence by 136%  >40 kg/m2 Extreme obesity (Class III) Increased incidence by 254%   Patient's current BMI Ideal Body weight  Body mass index is 21.87 kg/m. Ideal body weight: 45.5 kg (100 lb 4.9 oz) Adjusted ideal body weight: 47.6 kg (104 lb 15.8 oz)   BMI Readings  from Last 4 Encounters:  11/08/19 21.87 kg/m  07/14/19 22.26 kg/m  06/17/19 22.99 kg/m  05/10/19 23.83 kg/m   Wt Readings from Last 4 Encounters:  11/08/19 112 lb (50.8 kg)  07/14/19 114 lb (51.7 kg)  06/17/19 117 lb 11.2 oz (53.4 kg)  05/10/19 122 lb (55.3 kg)    Psych/Mental status: Alert, oriented x 3 (person, place, & time)       Eyes: PERLA Respiratory: No evidence of acute respiratory distress  Cervical Spine Exam  Skin & Axial Inspection: No masses, redness, edema, swelling, or associated skin lesions Alignment: Symmetrical Functional ROM: Unrestricted ROM      Stability: No instability detected Muscle Tone/Strength: Functionally intact. No obvious neuro-muscular anomalies detected. Sensory (Neurological): Unimpaired Palpation: No palpable anomalies              Upper Extremity (UE) Exam    Side:  Right upper extremity  Side: Left upper extremity  Skin & Extremity Inspection: Skin color, temperature, and hair growth are WNL. No peripheral edema or cyanosis. No masses, redness, swelling, asymmetry, or associated skin lesions. No contractures.  Skin & Extremity Inspection: Skin color, temperature, and hair growth are WNL. No peripheral edema or cyanosis. No masses, redness, swelling, asymmetry, or associated skin lesions. No contractures.  Functional ROM: Unrestricted ROM          Functional ROM: Unrestricted ROM          Muscle Tone/Strength: Functionally intact. No obvious neuro-muscular anomalies detected.   Muscle Tone/Strength: Functionally intact. No obvious neuro-muscular anomalies detected.  Sensory (Neurological): Unimpaired          Sensory (Neurological): Unimpaired          Palpation: No palpable anomalies              Palpation: No palpable anomalies              Provocative Test(s):  Phalen's test: deferred Tinel's test: deferred Apley's scratch test (touch opposite shoulder):  Action 1 (Across chest): deferred Action 2 (Overhead): deferred Action 3 (LB reach): deferred   Provocative Test(s):  Phalen's test: deferred Tinel's test: deferred Apley's scratch test (touch opposite shoulder):  Action 1 (Across chest): deferred Action 2 (Overhead): deferred Action 3 (LB reach): deferred    Thoracic Spine Area Exam  Skin & Axial Inspection: No masses, redness, or swelling Alignment: Symmetrical Functional ROM: Unrestricted ROM Stability: No instability detected Muscle Tone/Strength: Functionally intact. No obvious neuro-muscular anomalies detected. Sensory (Neurological): Unimpaired Muscle strength & Tone: No palpable anomalies  Lumbar Exam  Skin & Axial Inspection: Thoraco-lumbar Scoliosis Alignment: Scoliosis detected Functional ROM: Pain restricted ROM affecting both sides Stability: No instability detected Muscle Tone/Strength: Functionally intact. No obvious  neuro-muscular anomalies detected. Sensory (Neurological): Dermatomal pain pattern bilaterally Palpation: Tender to palpation       Provocative Tests: Hyperextension/rotation test: deferred today       Lumbar quadrant test (Kemp's test): (+) due to pain. Lateral bending test: deferred today       Patrick's Maneuver: deferred today                   FABER* test: deferred today                   S-I anterior distraction/compression test: deferred today         S-I lateral compression test: deferred today         S-I Thigh-thrust test: deferred today  S-I Gaenslen's test: deferred today         *(Flexion, ABduction and External Rotation)  Gait & Posture Assessment  Ambulation: Limited Gait: Antalgic Posture: Difficulty standing up straight, due to pain   Lower Extremity Exam    Side: Right lower extremity  Side: Left lower extremity  Stability: No instability observed          Stability: No instability observed          Skin & Extremity Inspection: Skin color, temperature, and hair growth are WNL. No peripheral edema or cyanosis. No masses, redness, swelling, asymmetry, or associated skin lesions. No contractures.  Skin & Extremity Inspection: Skin color, temperature, and hair growth are WNL. No peripheral edema or cyanosis. No masses, redness, swelling, asymmetry, or associated skin lesions. No contractures.  Functional ROM: Pain restricted ROM for all joints of the lower extremity          Functional ROM: Pain restricted ROM for all joints of the lower extremity          Muscle Tone/Strength: Functionally intact. No obvious neuro-muscular anomalies detected.  Muscle Tone/Strength: Functionally intact. No obvious neuro-muscular anomalies detected.  Sensory (Neurological): Dermatomal pain pattern        Sensory (Neurological): Dermatomal pain pattern        DTR: Patellar: deferred today Achilles: deferred today Plantar: deferred today  DTR: Patellar: deferred today Achilles:  deferred today Plantar: deferred today  Palpation: No palpable anomalies  Palpation: No palpable anomalies   Assessment  Primary Diagnosis & Pertinent Problem List: The primary encounter diagnosis was Failed back surgical syndrome. Diagnoses of Lumbar post-laminectomy syndrome, Chronic radicular lumbar pain (LEFT), Intractable neuropathic pain of left foot, and Chronic pain syndrome were also pertinent to this visit.  Visit Diagnosis (New problems to examiner): 1. Failed back surgical syndrome   2. Lumbar post-laminectomy syndrome   3. Chronic radicular lumbar pain (LEFT)   4. Intractable neuropathic pain of left foot   5. Chronic pain syndrome    Plan of Care (Initial workup plan)   Imaging Orders     DG PAIN CLINIC C-ARM 1-60 MIN NO REPORT  Referral Orders     Ambulatory referral to Psychology  I did discussion with Julie Mckinney about the risks and benefits of spinal cord stimulator trial/implant.  I informed her that she would be doing a trial with me and if successful, she would return to Dr. Cari Caraway for a subsequent permanent implant.  Of note patient has chronic low back and bilateral leg pain related to chronic lumbar radiculopathy, failed back surgical syndrome that has been refractory to medication management, physical therapy and epidural steroid injections as noted above.  Of note, patient does have a T10 compression fracture that is causing mild thoracic canal stenosis and mild mass-effect.  I discussed this with the patient and this does concern me as it may increase her risk of complications with a spinal cord stimulator trial as we are advancing our electrodes.  I reviewed the patient's thoracic MRI on both sagittal and axial T2 weighted and upon my interpretation, I would rate her mass-effect as mild to moderate.  This was discussed with the patient and the implications that it has from a risk standpoint on pursuing a spinal cord stimulator trial.  I will discuss further  with Dr. Cari Caraway and have him weigh in on potential SCS trial in the context of her T10 compression fracture with retropulsion causing mild to moderate mass-effect.   Provider-requested follow-up: Return  in about 4 weeks (around 12/06/2019) for discuss SCS (t10 compression fx).  Future Appointments  Date Time Provider [place]  11/19/2019 11:00 AM Ursula Alert, MD [facility] None  12/06/2019  1:40 PM Gillis Santa, MD [facility] None  12/09/2019 10:00 AM Sindy Guadel[place], MD [facility] None    Note by: Gillis Santa, MD Date: 11/08/2019; Time: 2:07 PM

## 2019-11-08 NOTE — Patient Instructions (Signed)
You will complete psych referral prior to next appt. With pain clinic.

## 2019-11-10 ENCOUNTER — Telehealth: Payer: Self-pay

## 2019-11-10 NOTE — Telephone Encounter (Signed)
What do you mean by a PSY make up for surgery ? if she needs a letter stating she is under our care for psychiatric reasons , we can.

## 2019-11-10 NOTE — Telephone Encounter (Signed)
pt called states she needs a psy makeup for surgery on her back will you do that or does she have to go somewhere else.

## 2019-11-19 ENCOUNTER — Other Ambulatory Visit: Payer: Self-pay

## 2019-11-19 ENCOUNTER — Telehealth (INDEPENDENT_AMBULATORY_CARE_PROVIDER_SITE_OTHER): Payer: Medicare Other | Admitting: Psychiatry

## 2019-11-19 ENCOUNTER — Encounter: Payer: Self-pay | Admitting: Psychiatry

## 2019-11-19 DIAGNOSIS — F3342 Major depressive disorder, recurrent, in full remission: Secondary | ICD-10-CM

## 2019-11-19 DIAGNOSIS — F411 Generalized anxiety disorder: Secondary | ICD-10-CM | POA: Diagnosis not present

## 2019-11-19 DIAGNOSIS — G4701 Insomnia due to medical condition: Secondary | ICD-10-CM

## 2019-11-19 MED ORDER — BUSPIRONE HCL 7.5 MG PO TABS: 7.5000 mg | ORAL_TABLET | Freq: Two times a day (BID) | ORAL | 1 refills | Status: DC

## 2019-11-19 NOTE — Progress Notes (Signed)
Virtual Visit via Telephone Note  I connected with Julie Mckinney on 11/19/19 at 11:00 AM EDT by telephone and verified that I am speaking with the correct person using two identifiers.  Location Provider Location : [facility] Patient Location : Home  Participants: Patient , Provider   I discussed the limitations, risks, security and privacy concerns of performing an evaluation and management service by telephone and the availability of in person appointments. I also discussed with the patient that there may be a patient responsible charge related to this service. The patient expressed understanding and agreed to proceed.    I discussed the assessment and treatment plan with the patient. The patient was provided an opportunity to ask questions and all were answered. The patient agreed with the plan and demonstrated an understanding of the instructions.   The patient was advised to call back or seek an in-person evaluation if the symptoms worsen or if the condition fails to improve as anticipated.   Samson MD OP Progress Note  11/19/2019 11:18 AM Julie Mckinney  MRN:  [id number]  Chief Complaint:  Chief Complaint    Follow-up     HPI: Julie Mckinney is a 85 year old Caucasian female lives in a senior living community in [place], married, has a history of MDD, GAD, COPD, chronic pain, rheumatoid arthritis, history of breast cancer in remission on tamoxifen, recent fracture of her spine in December 2020, chronic pain was evaluated by phone today.  Patient today reports she is currently looking forward to getting a spinal cord stimulator to manage her pain.  She does have upcoming appointment with her pain provider for discussion.  Patient reports the BuSpar has definitely helped her with her anxiety.  It does make her very relaxed during the day and causes some sleepiness however she is able to cope with it and it does not affect her much.  Patient is compliant on Celexa.  She reports  sleep and appetite as good.  Patient denies any suicidality, homicidality or perceptual disturbances.  Patient denies any other concerns today.  Visit Diagnosis:    ICD-10-CM   1. MDD (major depressive disorder), recurrent, in full remission ([place])  F33.42   2. GAD (generalized anxiety disorder)  F41.1 busPIRone (BUSPAR) 7.5 MG tablet  3. Insomnia due to medical condition  G47.01     Past Psychiatric History: I have reviewed past psychiatric history from my progress note on 10/23/2017.  Past trials of Celexa, mirtazapine, melatonin  Past Medical History:  Past Medical History:  Diagnosis Date  . Anxiety   . Asthma   . Breast cancer ([place]) Right  . COPD (chronic obstructive pulmonary disease) ([place])   . Depression   . Personal history of radiation therapy     Past Surgical History:  Procedure Laterality Date  . ABDOMINAL HYSTERECTOMY    . APPENDECTOMY    . BACK SURGERY    . BREAST BIOPSY Right 2015   +  . BREAST EXCISIONAL BIOPSY Right 1975   neg  . BREAST LUMPECTOMY Right 2015   invasive lobular carcinoma  . CHOLECYSTECTOMY    . COLONOSCOPY     Scheduled for July 2018  . COLONOSCOPY WITH PROPOFOL N/A 08/14/2016   Procedure: COLONOSCOPY WITH PROPOFOL;  Surgeon: Manya Silvas, MD;  Location[facility] ENDOSCOPY;  Service: Endoscopy;  Laterality: N/A;  . ESOPHAGOGASTRODUODENOSCOPY (EGD) WITH PROPOFOL N/A 08/14/2016   Procedure: ESOPHAGOGASTRODUODENOSCOPY (EGD) WITH PROPOFOL;  Surgeon: Manya Silvas, MD;  Location: [facility] ENDOSCOPY;  Service: Endoscopy;  Laterality: N/A;  Family Psychiatric History: I have reviewed family psychiatric history from my progress note on 10/23/2017  Family History:  Family History  Problem Relation Age of Onset  . Cancer Father   . Breast cancer Neg Hx     Social History: I have reviewed social history from my progress note on 10/23/2017 Social History   Socioeconomic History  . Marital status: Married    Spouse name: ronald  . Number of  children: 2  . Years of education: Not on file  . Highest education level: Some college, no degree  Occupational History  . Not on file  Tobacco Use  . Smoking status: Former Smoker    Packs/day: 0.50    Years: 30.00    Pack years: 15.00    Types: Cigarettes    Quit date: 06/22/1995    Years since quitting: 24.4  . Smokeless tobacco: Never Used  Vaping Use  . Vaping Use: Never used  Substance and Sexual Activity  . Alcohol use: Yes    Alcohol/week: 1.0 standard drink    Types: 1 Glasses of wine per week    Comment: Occasional  . Drug use: No  . Sexual activity: Not Currently  Other Topics Concern  . Not on file  Social History Narrative  . Not on file   Social Determinants of Health   Financial Resource Strain:   . Difficulty of Paying Living Expenses: Not on file  Food Insecurity:   . Worried About Charity fundraiser in the Last Year: Not on file  . Ran Out of Food in the Last Year: Not on file  Transportation Needs:   . Lack of Transportation (Medical): Not on file  . Lack of Transportation (Non-Medical): Not on file  Physical Activity:   . Days of Exercise per Week: Not on file  . Minutes of Exercise per Session: Not on file  Stress:   . Feeling of Stress : Not on file  Social Connections:   . Frequency of Communication with Friends and Family: Not on file  . Frequency of Social Gatherings with Friends and Family: Not on file  . Attends Religious Services: Not on file  . Active Member of Clubs or Organizations: Not on file  . Attends Archivist Meetings: Not on file  . Marital Status: Not on file    Allergies:  Allergies  Allergen Reactions  . Tape Rash    Metabolic Disorder Labs: No results found for: HGBA1C, MPG No results found for: PROLACTIN No results found for: CHOL, TRIG, HDL, CHOLHDL, VLDL, LDLCALC No results found for: TSH  Therapeutic Level Labs: No results found for: LITHIUM No results found for: VALPROATE No components found  for:  CBMZ  Current Medications: Current Outpatient Medications  Medication Sig Dispense Refill  . acetaminophen (TYLENOL) 500 MG tablet Take by mouth.     Marland Kitchen albuterol (PROVENTIL HFA;VENTOLIN HFA) 108 (90 Base) MCG/ACT inhaler Inhale 2 puffs into the lungs every 6 (six) hours as needed.     Marland Kitchen amLODipine (NORVASC) 2.5 MG tablet Take 2.5 mg by mouth daily. (Patient not taking: Reported on 11/08/2019)    . atorvastatin (LIPITOR) 40 MG tablet TAKE ONE TABLET BY MOUTH EVERY EVENING    . Azelastine-Fluticasone 137-50 MCG/ACT SUSP Place 1 spray into the nose daily.     . busPIRone (BUSPAR) 7.5 MG tablet Take 1 tablet (7.5 mg total) by mouth 2 (two) times daily. 180 tablet 1  . calcium carbonate (CALCIUM 600) 600 MG TABS  tablet Take 600 mg by mouth 2 (two) times daily.    . Cholecalciferol (VITAMIN D3) 1000 units CAPS Take 1,000 mg by mouth daily. (Patient not taking: Reported on 11/08/2019)    . citalopram (CELEXA) 20 MG tablet Take 1.5 tablets (30 mg total) by mouth daily. 135 tablet 1  . clobetasol cream (TEMOVATE) 6.44 % Apply 1 application topically as needed.  (Patient not taking: Reported on 10/21/2019)    . clotrimazole-betamethasone (LOTRISONE) cream Apply 1 application topically 2 (two) times daily as needed.  (Patient not taking: Reported on 10/21/2019)    . diclofenac (VOLTAREN) 75 MG EC tablet Take 75 mg by mouth daily.     . fluconazole (DIFLUCAN) 150 MG tablet Take 150 mg by mouth once.     . fluticasone (FLONASE) 50 MCG/ACT nasal spray Place 1 spray into the nose as needed.    . Fluticasone-Umeclidin-Vilant (TRELEGY ELLIPTA) 100-62.5-25 MCG/INH AEPB Inhale 1 puff into the lungs daily.    Marland Kitchen GLUCOSAMINE SULFATE PO Take 1 capsule by mouth 2 (two) times daily.    . hydrOXYzine (ATARAX/VISTARIL) 25 MG tablet Take 1 tablet (25 mg total) by mouth daily as needed for anxiety. (Patient taking differently: Take 25 mg by mouth every 8 (eight) hours as needed for anxiety. ) 90 tablet 1  .  levothyroxine (SYNTHROID, LEVOTHROID) 50 MCG tablet Take 50 mcg by mouth daily.    . Melatonin 5 MG TABS Take 1 tablet by mouth as needed.  (Patient not taking: Reported on 11/08/2019)    . Melatonin-Pyridoxine 5-1 MG TABS Take by mouth. (Patient not taking: Reported on 10/21/2019)    . mirabegron ER (MYRBETRIQ) 25 MG TB24 tablet Take by mouth. (Patient not taking: Reported on 11/08/2019)    . mirabegron ER (MYRBETRIQ) 50 MG TB24 tablet Take by mouth. (Patient not taking: Reported on 10/21/2019)    . Multiple Vitamin (MULTI-VITAMINS) TABS Take 1 tablet by mouth daily.    . Nebulizers MISC Use    . neomycin-polymyxin b-dexamethasone (MAXITROL) 3.5-10000-0.1 OINT SMARTSIG:1 Sparingly In Eye(s) Every Night    . omeprazole (PRILOSEC) 20 MG capsule Take 20 mg by mouth daily.    Marland Kitchen PATADAY 0.2 % SOLN     . Potassium 99 MG TABS Take 1 tablet by mouth daily.     . Potassium Gluconate 2.5 MEQ TABS Take by mouth.    . simvastatin (ZOCOR) 80 MG tablet Take 80 mg by mouth daily. (Patient not taking: Reported on 10/21/2019)    . tiZANidine (ZANAFLEX) 2 MG tablet Take by mouth. (Patient not taking: Reported on 11/08/2019)    . traMADol (ULTRAM) 50 MG tablet Take 50 mg by mouth 2 (two) times daily as needed.    . vitamin B-12 (CYANOCOBALAMIN) 1000 MCG tablet Take 1,000 mcg by mouth daily.     No current facility-administered medications for this visit.     Musculoskeletal: Strength & Muscle Tone: UTA Gait & Station: UTA Patient leans: N/A  Psychiatric Specialty Exam: Review of Systems  Musculoskeletal: Positive for back pain.  Psychiatric/Behavioral: Negative for agitation, behavioral problems, confusion, decreased concentration, dysphoric mood, hallucinations, self-injury, sleep disturbance and suicidal ideas. The patient is not nervous/anxious and is not hyperactive.   All other systems reviewed and are negative.   There were no vitals taken for this visit.There is no height or weight on file to  calculate BMI.  General Appearance: UTA  Eye Contact:  UTA  Speech:  Clear and Coherent  Volume:  Normal  Mood:  Euthymic  Affect:  UTA  Thought Process:  Goal Directed and Descriptions of Associations: Intact  Orientation:  Full (Time, Place, and Person)  Thought Content: Logical   Suicidal Thoughts:  No  Homicidal Thoughts:  No  Memory:  Immediate;   Fair Recent;   Fair Remote;   Fair  Judgement:  Fair  Insight:  Fair  Psychomotor Activity:  UTA  Concentration:  Concentration: Fair and Attention Span: Fair  Recall:  [employer] of Knowledge: Fair  Language: Fair  Akathisia:  No  Handed:  Right  AIMS (if indicated): UTA  Assets:  Communication Skills Desire for Improvement Housing Social Support  ADL's:  Intact  Cognition: WNL  Sleep:  Fair   Screenings:   Assessment and Plan: Julie Mckinney is a 85 year old female, married, lives in an independent senior living community in [place], has a history of MDD, GAD, sleep problems, chronic pain, recent spinal fracture, COPD, breast cancer in remission, was evaluated by phone today.  Patient is currently stable on current medication regimen.  Plan as noted below.  Plan MDD in full remission Celexa 30 mg p.o. daily  GAD-stable BuSpar 7.5 mg p.o. twice daily Celexa 30 mg p.o. daily Hydroxyzine 25 to 50 mg daily as needed  Insomnia-stable We will monitor closely.  Follow-up in clinic in 2 to 3 months or sooner if needed.  I have spent atleast 15 minutes non face to face with patient today. More than 50 % of the time was spent for preparing to see the patient ( e.g., review of test, records ), ordering medications and test ,psychoeducation and supportive psychotherapy and care coordination,as well as documenting clinical information in electronic health record. This note was generated in part or whole with voice recognition software. Voice recognition is usually quite accurate but there are transcription errors that can  and very often do occur. I apologize for any typographical errors that were not detected and corrected.      Ursula Alert, MD 11/19/2019, 11:18 AM

## 2019-12-06 ENCOUNTER — Encounter: Payer: Self-pay | Admitting: Student in an Organized Health Care Education/Training Program

## 2019-12-06 ENCOUNTER — Ambulatory Visit
Payer: Medicare Other | Attending: Student in an Organized Health Care Education/Training Program | Admitting: Student in an Organized Health Care Education/Training Program

## 2019-12-06 ENCOUNTER — Other Ambulatory Visit: Payer: Self-pay

## 2019-12-06 VITALS — BP 139/68 | HR 66 | Temp 97.9°F | Resp 18 | Ht 60.0 in | Wt 112.0 lb

## 2019-12-06 DIAGNOSIS — G894 Chronic pain syndrome: Secondary | ICD-10-CM | POA: Diagnosis present

## 2019-12-06 DIAGNOSIS — M5416 Radiculopathy, lumbar region: Secondary | ICD-10-CM | POA: Diagnosis not present

## 2019-12-06 DIAGNOSIS — M792 Neuralgia and neuritis, unspecified: Secondary | ICD-10-CM | POA: Diagnosis present

## 2019-12-06 DIAGNOSIS — Z0289 Encounter for other administrative examinations: Secondary | ICD-10-CM | POA: Insufficient documentation

## 2019-12-06 DIAGNOSIS — G8929 Other chronic pain: Secondary | ICD-10-CM | POA: Diagnosis present

## 2019-12-06 DIAGNOSIS — M961 Postlaminectomy syndrome, not elsewhere classified: Secondary | ICD-10-CM | POA: Diagnosis not present

## 2019-12-06 MED ORDER — BUPRENORPHINE 5 MCG/HR TD PTWK: 1.0000 | MEDICATED_PATCH | TRANSDERMAL | 0 refills | Status: DC

## 2019-12-06 NOTE — Progress Notes (Signed)
Safety precautions to be maintained throughout the outpatient stay will include: orient to surroundings, keep bed in low position, maintain call bell within reach at all times, provide assistance with transfer out of bed and ambulation.  

## 2019-12-06 NOTE — Patient Instructions (Signed)
1. Sign pain contract 2. Follow up in 4 weeks F2F 3.

## 2019-12-06 NOTE — Progress Notes (Signed)
PROVIDER NOTE: Information contained herein reflects review and annotations entered in association with encounter. Interpretation of such information and data should be left to medically-trained personnel. Information provided to patient can be located elsewhere in the medical record under "Patient Instructions". Document created using STT-dictation technology, any transcriptional errors that may result from process are unintentional.    Patient: Julie Mckinney  Service Category: E/M  Provider: Gillis Santa, MD  DOB: 1934-02-11  DOS: 12/06/2019  Specialty: Interventional Pain Management  MRN: [id number]  Setting: Ambulatory outpatient  PCP: Dion Body, MD  Type: Established Patient    Referring Provider: Dion Body, MD  Location: Office  Delivery: Face-to-face     HPI  [name], a 85 y.o. year old female, is here today because of her Failed back surgical syndrome [M96.1]. Julie Mckinney's primary complain today is low back pain, bilateral leg pain  Last encounter: My last encounter with her was on 11/08/2019. Pertinent problems: Julie Mckinney has History of CVA (cerebrovascular accident) without residual deficits; MDD (major depressive disorder), recurrent episode, mild ([place]); T10 vertebral fracture ([place]); Closed stable burst fracture of T12 vertebra ([place]); Failed back surgical syndrome; MDD (major depressive disorder), recurrent, in full remission ([place]); Pain management contract signed; and Chronic pain syndrome on their pertinent problem list. Pain Assessment: Severity of Chronic pain is reported as a 8 /10. Location: Back Lower, Left/Radiates from left lower back to back of leg to left foot. Onset: More than a month ago. Quality: Constant, Stabbing, Throbbing. Timing: Intermittent. Modifying factor(s): Laying down. Vitals:  height is 5' (1.524 m) and weight is 112 lb (50.8 kg). Her temperature is 97.9 F (36.6 C). Her blood pressure is 139/68 and her pulse is 66. Her  respiration is 18 and oxygen saturation is 98%.   Reason for encounter: follow-up evaluation   Patient presents today for her second patient visit. I informed the patient that after discussion with Dr. Cari Caraway, patient is not a candidate for spinal cord stimulator trial given history of compression fracture that is causing mild thoracic canal stenosis and mild mass-effect.  I discussed this with the patient and this does concern me as it may increase her risk of complications with a spinal cord stimulator trial as we are advancing our electrodes.  I reviewed the patient's thoracic MRI on both sagittal and axial T2 weighted and upon my interpretation, I would rate her mass-effect as mild to moderate.  This was discussed with the patient and the implications that it has from a risk standpoint on pursuing a spinal cord stimulator trial. For this reason, we will not move forward with spinal cord stimulator trial and focus primarily on medication management for pain relief.  Of note, she is prescribed tramadol by physical medicine and rehab which she is takes 1 to twice a day. She does not find long-lasting pain relief with this. She would like to avoid hydrocodone, oxycodone as a resulted in nausea and cognitive side effects in the past. Discussed Butrans patch as below. We will start at 5 mcg an hour and patient will follow up in 4 weeks to assess how she is doing.  Pharmacotherapy Assessment   Analgesic: start Butrans patch at 5 mcg/hr   Monitoring: [place] PMP: PDMP reviewed during this encounter.       Pharmacotherapy: No side-effects or adverse reactions reported. Compliance: No problems identified. Effectiveness: Clinically acceptable.  Janne Napoleon, RN  12/06/2019  1:42 PM  Sign when Signing Visit Safety precautions to be maintained throughout  the outpatient stay will include: orient to surroundings, keep bed in low position, maintain call bell within reach at all times, provide assistance  with transfer out of bed and ambulation.     UDS: No results found for: SUMMARY   ROS  Constitutional: Denies any fever or chills Gastrointestinal: No reported hemesis, hematochezia, vomiting, or acute GI distress Musculoskeletal: Denies any acute onset joint swelling, redness, loss of ROM, or weakness Neurological: No reported episodes of acute onset apraxia, aphasia, dysarthria, agnosia, amnesia, paralysis, loss of coordination, or loss of consciousness  Medication Review  Azelastine-Fluticasone, Fluticasone-Umeclidin-Vilant, Glucosamine Sulfate, Multi-Vitamins, Nebulizers, Olopatadine HCl, Potassium, Potassium Gluconate, albuterol, atorvastatin, buprenorphine, busPIRone, calcium carbonate, citalopram, diclofenac, fluticasone, levothyroxine, neomycin-polymyxin b-dexamethasone, omeprazole, simvastatin, tiZANidine, traMADol, and vitamin B-12  History Review  Allergy: Julie Mckinney is allergic to tape. Drug: Julie Mckinney  reports no history of drug use. Alcohol:  reports current alcohol use of about 1.0 standard drink of alcohol per week. Tobacco:  reports that she quit smoking about 24 years ago. Her smoking use included cigarettes. She has a 15.00 pack-year smoking history. She has never used smokeless tobacco. Social: Julie Mckinney  reports that she quit smoking about 24 years ago. Her smoking use included cigarettes. She has a 15.00 pack-year smoking history. She has never used smokeless tobacco. She reports current alcohol use of about 1.0 standard drink of alcohol per week. She reports that she does not use drugs. Medical:  has a past medical history of Anxiety, Asthma, Breast cancer ([place]) (Right), COPD (chronic obstructive pulmonary disease) ([place]), Depression, and Personal history of radiation therapy. Surgical: Julie Mckinney  has a past surgical history that includes Appendectomy; Colonoscopy; Cholecystectomy; Abdominal hysterectomy; Esophagogastroduodenoscopy (egd) with propofol (N/A,  08/14/2016); Colonoscopy with propofol (N/A, 08/14/2016); Back surgery; Breast biopsy (Right, 2015); Breast excisional biopsy (Right, 1975); and Breast lumpectomy (Right, 2015). Family: family history includes Cancer in her father.  Laboratory Chemistry Profile   Renal Lab Results  Component Value Date   BUN 27 (H) 01/19/2019   CREATININE 0.56 01/19/2019   GFRAA >60 01/19/2019   GFRNONAA >60 01/19/2019     Hepatic Lab Results  Component Value Date   AST 20 01/17/2019   ALT 18 01/17/2019   ALBUMIN 3.5 01/17/2019   ALKPHOS 34 (L) 01/17/2019     Electrolytes Lab Results  Component Value Date   NA 142 01/19/2019   K 4.0 01/19/2019   CL 111 01/19/2019   CALCIUM 8.5 (L) 01/19/2019     Bone No results found for: VD25OH, VD125OH2TOT, JE5631SH7, WY6378HY8, 25OHVITD1, 25OHVITD2, 25OHVITD3, TESTOFREE, TESTOSTERONE   Inflammation (CRP: Acute Phase) (ESR: Chronic Phase) No results found for: CRP, ESRSEDRATE, LATICACIDVEN     Note: Above Lab results reviewed.   Physical Exam  General appearance: Well nourished, well developed, and well hydrated. In no apparent acute distress Mental status: Alert, oriented x 3 (person, place, & time)       Respiratory: No evidence of acute respiratory distress Eyes: PERLA Vitals: BP 139/68   Pulse 66   Temp 97.9 F (36.6 C)   Resp 18   Ht 5' (1.524 m)   Wt 112 lb (50.8 kg)   SpO2 98%   BMI 21.87 kg/m  BMI: Estimated body mass index is 21.87 kg/m as calculated from the following:   Height as of this encounter: 5' (1.524 m).   Weight as of this encounter: 112 lb (50.8 kg). Ideal: Ideal body weight: 45.5 kg (100 lb 4.9 oz) Adjusted ideal body weight: 47.6  kg (104 lb 15.8 oz)   Thoracic Spine Area Exam  Skin & Axial Inspection: No masses, redness, or swelling Alignment: Symmetrical Functional ROM: Unrestricted ROM Stability: No instability detected Muscle Tone/Strength: Functionally intact. No obvious neuro-muscular anomalies  detected. Sensory (Neurological): Unimpaired Muscle strength & Tone: No palpable anomalies  Lumbar Exam  Skin & Axial Inspection: Thoraco-lumbar Scoliosis Alignment: Scoliosis detected Functional ROM: Pain restricted ROM affecting both sides Stability: No instability detected Muscle Tone/Strength: Functionally intact. No obvious neuro-muscular anomalies detected. Sensory (Neurological): Dermatomal pain pattern bilaterally Palpation: Tender to palpation       Provocative Tests: Hyperextension/rotation test: deferred today       Lumbar quadrant test (Kemp's test): (+) due to pain. Lateral bending test: deferred today       Patrick's Maneuver: deferred today                   FABER* test: deferred today                   S-I anterior distraction/compression test: deferred today         S-I lateral compression test: deferred today         S-I Thigh-thrust test: deferred today         S-I Gaenslen's test: deferred today         *(Flexion, ABduction and External Rotation)  Gait & Posture Assessment  Ambulation: Limited Gait: Antalgic Posture: Difficulty standing up straight, due to pain   Lower Extremity Exam    Side: Right lower extremity  Side: Left lower extremity  Stability: No instability observed          Stability: No instability observed          Skin & Extremity Inspection: Skin color, temperature, and hair growth are WNL. No peripheral edema or cyanosis. No masses, redness, swelling, asymmetry, or associated skin lesions. No contractures.  Skin & Extremity Inspection: Skin color, temperature, and hair growth are WNL. No peripheral edema or cyanosis. No masses, redness, swelling, asymmetry, or associated skin lesions. No contractures.  Functional ROM: Pain restricted ROM for all joints of the lower extremity          Functional ROM: Pain restricted ROM for all joints of the lower extremity          Muscle Tone/Strength: Functionally intact. No obvious neuro-muscular  anomalies detected.  Muscle Tone/Strength: Functionally intact. No obvious neuro-muscular anomalies detected.  Sensory (Neurological): Dermatomal pain pattern        Sensory (Neurological): Dermatomal pain pattern        DTR: Patellar: deferred today Achilles: deferred today Plantar: deferred today  DTR: Patellar: deferred today Achilles: deferred today Plantar: deferred today  Palpation: No palpable anomalies  Palpation: No palpable anomalies     Assessment   Status Diagnosis  Persistent Persistent Persistent 1. Failed back surgical syndrome   2. Lumbar post-laminectomy syndrome   3. Chronic radicular lumbar pain (LEFT)   4. Intractable neuropathic pain of left foot   5. Chronic pain syndrome   6. Pain management contract signed      Updated Problems: Problem  Pain Management Contract Signed  Chronic Pain Syndrome  Mdd (Major Depressive Disorder), Recurrent, in Full Remission ([facility])  Failed Back Surgical Syndrome  T10 Vertebral Fracture ([facility])  Closed Stable Burst Fracture of T12 Vertebra ([facility])  Mdd (Major Depressive Disorder), Recurrent Episode, Mild ([facility])  History of Cva (Cerebrovascular Accident) Without Residual Deficits    Plan of Care  Ms.  Julie Mckinney has a current medication list which includes the following long-term medication(s): albuterol, azelastine-fluticasone, calcium carbonate, citalopram, fluticasone, levothyroxine, omeprazole, potassium, potassium gluconate, and simvastatin.  Pharmacotherapy (Medications Ordered): Meds ordered this encounter  Medications  . buprenorphine (BUTRANS) 5 MCG/HR PTWK    Sig: Place 1 patch onto the skin every 7 (seven) days.    Dispense:  4 patch    Refill:  0    For chronic pain syndrome   Patient and husband were given instructions about proper Butrans patch application and rotation.  Follow-up plan:   Return in about 4 weeks (around 01/03/2020) for Medication Management, in person (butrans 5).   Recent  Visits Date Type Provider Dept  11/08/19 Office Visit Gillis Santa, MD [facility]  Showing recent visits within past 90 days and meeting all other requirements Today's Visits Date Type Provider Dept  12/06/19 Office Visit Gillis Santa, MD [facility]  Showing today's visits and meeting all other requirements Future Appointments Date Type Provider Dept  01/03/20 Appointment Gillis Santa, MD [facility]  Showing future appointments within next 90 days and meeting all other requirements  I discussed the assessment and treatment plan with the patient. The patient was provided an opportunity to ask questions and all were answered. The patient agreed with the plan and demonstrated an understanding of the instructions.  Patient advised to call back or seek an in-person evaluation if the symptoms or condition worsens.  Duration of encounter: 30 minutes.  Note by: Gillis Santa, MD Date: 12/06/2019; Time: 3:04 PM

## 2019-12-09 ENCOUNTER — Ambulatory Visit: Payer: Medicare Other | Admitting: Oncology

## 2019-12-13 ENCOUNTER — Other Ambulatory Visit: Payer: Self-pay

## 2019-12-13 ENCOUNTER — Inpatient Hospital Stay: Payer: Medicare Other | Attending: Oncology | Admitting: Oncology

## 2019-12-13 ENCOUNTER — Encounter: Payer: Self-pay | Admitting: Oncology

## 2019-12-13 VITALS — BP 144/60 | HR 66 | Temp 99.3°F | Resp 16 | Wt 115.4 lb

## 2019-12-13 DIAGNOSIS — C50911 Malignant neoplasm of unspecified site of right female breast: Secondary | ICD-10-CM | POA: Diagnosis not present

## 2019-12-13 DIAGNOSIS — M8580 Other specified disorders of bone density and structure, unspecified site: Secondary | ICD-10-CM | POA: Diagnosis not present

## 2019-12-13 DIAGNOSIS — Z79810 Long term (current) use of selective estrogen receptor modulators (SERMs): Secondary | ICD-10-CM | POA: Insufficient documentation

## 2019-12-13 DIAGNOSIS — Z809 Family history of malignant neoplasm, unspecified: Secondary | ICD-10-CM | POA: Diagnosis not present

## 2019-12-13 DIAGNOSIS — Z87891 Personal history of nicotine dependence: Secondary | ICD-10-CM | POA: Diagnosis not present

## 2019-12-13 DIAGNOSIS — Z853 Personal history of malignant neoplasm of breast: Secondary | ICD-10-CM | POA: Diagnosis not present

## 2019-12-13 DIAGNOSIS — Z79899 Other long term (current) drug therapy: Secondary | ICD-10-CM | POA: Diagnosis not present

## 2019-12-13 DIAGNOSIS — Z08 Encounter for follow-up examination after completed treatment for malignant neoplasm: Secondary | ICD-10-CM

## 2019-12-13 DIAGNOSIS — Z170 Estrogen receptor positive status [ER+]: Secondary | ICD-10-CM | POA: Insufficient documentation

## 2019-12-13 DIAGNOSIS — J449 Chronic obstructive pulmonary disease, unspecified: Secondary | ICD-10-CM | POA: Diagnosis not present

## 2019-12-13 DIAGNOSIS — F419 Anxiety disorder, unspecified: Secondary | ICD-10-CM | POA: Diagnosis not present

## 2019-12-14 NOTE — Progress Notes (Signed)
Hematology/Oncology Consult note [facility]  Telephone:([phone] [phone]  Patient Care Team: Dion Body, MD as PCP - General (Family Medicine)   Name of the patient: Julie Mckinney  [id number]  1934/11/25   Date of visit: 12/14/19  Diagnosis-history of breast cancer s/p 5 years of tamoxifen  Chief complaint/ Reason for visit-routine follow-up of breast cancer  Heme/Onc history: 1. Patient is a 85 year old female with a history of invasive lobular carcinoma of the right breast diagnosed in 2015 lumpectomy and adjuvant radiation therapy. She was found to have a 6 x 5 x 10 mm mass in her right breast on core biopsy that was ER/PR positive and HER-2/neu negative. She underwent lumpectomy and sentinel lymph node biopsy in November 2015 which showed T2 N0 invasive lobular carcinoma measuring 2.6 cm, low-grade. 0 out of 4 sentinel lymph nodes were involved. Focally positive margin with involvement of the pectoralis muscle alone. Reexcision was not pursued and she received radiation boost to this area. She was subsequently started on hormone therapy with letrozole but had problems tolerating it due to myalgias and arthralgias and is currently on tamoxifen. She has had osteopenia in the past and has had nontraumatic bilateral were fractures despite taking Fosamax.She was then switched to tamoxifenand completed 5 years  Interval history-patient spent about a week with her niece and had a good time.  However during her evening out for dinner she had a fall at a restaurant and hurt her right side of the forehead.  Denies any new complaints at this time  ECOG PS- 2 Pain scale- 0  Review of systems- Review of Systems  Constitutional: Negative for chills, fever, malaise/fatigue and weight loss.  HENT: Negative for congestion, ear discharge and nosebleeds.   Eyes: Negative for blurred vision.  Respiratory: Negative for cough, hemoptysis, sputum  production, shortness of breath and wheezing.   Cardiovascular: Negative for chest pain, palpitations, orthopnea and claudication.  Gastrointestinal: Negative for abdominal pain, blood in stool, constipation, diarrhea, heartburn, melena, nausea and vomiting.  Genitourinary: Negative for dysuria, flank pain, frequency, hematuria and urgency.  Musculoskeletal: Negative for back pain, joint pain and myalgias.  Skin: Negative for rash.  Neurological: Negative for dizziness, tingling, focal weakness, seizures, weakness and headaches.  Endo/Heme/Allergies: Does not bruise/bleed easily.  Psychiatric/Behavioral: Negative for depression and suicidal ideas. The patient does not have insomnia.       Allergies  Allergen Reactions  . Tape Rash     Past Medical History:  Diagnosis Date  . Anxiety   . Asthma   . Breast cancer ([place]) Right  . COPD (chronic obstructive pulmonary disease) ([place])   . Depression   . Personal history of radiation therapy      Past Surgical History:  Procedure Laterality Date  . ABDOMINAL HYSTERECTOMY    . APPENDECTOMY    . BACK SURGERY    . BREAST BIOPSY Right 2015   +  . BREAST EXCISIONAL BIOPSY Right 1975   neg  . BREAST LUMPECTOMY Right 2015   invasive lobular carcinoma  . CHOLECYSTECTOMY    . COLONOSCOPY     Scheduled for July 2018  . COLONOSCOPY WITH PROPOFOL N/A 08/14/2016   Procedure: COLONOSCOPY WITH PROPOFOL;  Surgeon: Manya Silvas, MD;  Location: [facility] ENDOSCOPY;  Service: Endoscopy;  Laterality: N/A;  . ESOPHAGOGASTRODUODENOSCOPY (EGD) WITH PROPOFOL N/A 08/14/2016   Procedure: ESOPHAGOGASTRODUODENOSCOPY (EGD) WITH PROPOFOL;  Surgeon: Manya Silvas, MD;  Location[facility] ENDOSCOPY;  Service: Endoscopy;  Laterality:  N/A;    Social History   Socioeconomic History  . Marital status: Married    Spouse name: ronald  . Number of children: 2  . Years of education: Not on file  . Highest education level: Some college, no degree  Occupational  History  . Not on file  Tobacco Use  . Smoking status: Former Smoker    Packs/day: 0.50    Years: 30.00    Pack years: 15.00    Types: Cigarettes    Quit date: 06/22/1995    Years since quitting: 24.4  . Smokeless tobacco: Never Used  Vaping Use  . Vaping Use: Never used  Substance and Sexual Activity  . Alcohol use: Yes    Alcohol/week: 1.0 standard drink    Types: 1 Glasses of wine per week    Comment: Occasional-rare  . Drug use: No  . Sexual activity: Not Currently  Other Topics Concern  . Not on file  Social History Narrative  . Not on file   Social Determinants of Health   Financial Resource Strain:   . Difficulty of Paying Living Expenses: Not on file  Food Insecurity:   . Worried About Charity fundraiser in the Last Year: Not on file  . Ran Out of Food in the Last Year: Not on file  Transportation Needs:   . Lack of Transportation (Medical): Not on file  . Lack of Transportation (Non-Medical): Not on file  Physical Activity:   . Days of Exercise per Week: Not on file  . Minutes of Exercise per Session: Not on file  Stress:   . Feeling of Stress : Not on file  Social Connections:   . Frequency of Communication with Friends and Family: Not on file  . Frequency of Social Gatherings with Friends and Family: Not on file  . Attends Religious Services: Not on file  . Active Member of Clubs or Organizations: Not on file  . Attends Archivist Meetings: Not on file  . Marital Status: Not on file  Intimate Partner Violence:   . Fear of Current or Ex-Partner: Not on file  . Emotionally Abused: Not on file  . Physically Abused: Not on file  . Sexually Abused: Not on file    Family History  Problem Relation Age of Onset  . Cancer Father   . Breast cancer Neg Hx      Current Outpatient Medications:  .  albuterol (PROVENTIL HFA;VENTOLIN HFA) 108 (90 Base) MCG/ACT inhaler, Inhale 2 puffs into the lungs every 6 (six) hours as needed. , Disp: , Rfl:  .   atorvastatin (LIPITOR) 40 MG tablet, TAKE ONE TABLET BY MOUTH EVERY EVENING, Disp: , Rfl:  .  Azelastine-Fluticasone 137-50 MCG/ACT SUSP, Place 1 spray into the nose daily. , Disp: , Rfl:  .  buprenorphine (BUTRANS) 5 MCG/HR PTWK, Place 1 patch onto the skin every 7 (seven) days., Disp: 4 patch, Rfl: 0 .  busPIRone (BUSPAR) 7.5 MG tablet, Take 1 tablet (7.5 mg total) by mouth 2 (two) times daily., Disp: 180 tablet, Rfl: 1 .  calcium carbonate (CALCIUM 600) 600 MG TABS tablet, Take 600 mg by mouth 2 (two) times daily., Disp: , Rfl:  .  citalopram (CELEXA) 20 MG tablet, Take 1.5 tablets (30 mg total) by mouth daily., Disp: 135 tablet, Rfl: 1 .  fluticasone (FLONASE) 50 MCG/ACT nasal spray, Place 1 spray into the nose as needed., Disp: , Rfl:  .  Fluticasone-Umeclidin-Vilant (TRELEGY ELLIPTA) 100-62.5-25 MCG/INH AEPB, Inhale  1 puff into the lungs daily., Disp: , Rfl:  .  GLUCOSAMINE SULFATE PO, Take 1 capsule by mouth 2 (two) times daily., Disp: , Rfl:  .  levothyroxine (SYNTHROID, LEVOTHROID) 50 MCG tablet, Take 50 mcg by mouth daily., Disp: , Rfl:  .  Multiple Vitamin (MULTI-VITAMINS) TABS, Take 1 tablet by mouth daily., Disp: , Rfl:  .  omeprazole (PRILOSEC) 20 MG capsule, Take 20 mg by mouth daily., Disp: , Rfl:  .  PATADAY 0.2 % SOLN, Apply 1 drop to eye as needed. , Disp: , Rfl:  .  Potassium 99 MG TABS, Take 1 tablet by mouth daily. , Disp: , Rfl:  .  traMADol (ULTRAM) 50 MG tablet, Take 50 mg by mouth 2 (two) times daily as needed., Disp: , Rfl:  .  vitamin B-12 (CYANOCOBALAMIN) 1000 MCG tablet, Take 1,000 mcg by mouth daily., Disp: , Rfl:  .  Nebulizers MISC, Inhale 1 Dose into the lungs as needed. , Disp: , Rfl:   Physical exam:  Vitals:   12/13/19 1425  BP: (!) 144/60  Pulse: 66  Resp: 16  Temp: 99.3 F (37.4 C)  TempSrc: Tympanic  SpO2: 97%  Weight: 115 lb 6.4 oz (52.3 kg)   Physical Exam Constitutional:      General: She is not in acute distress.    Comments: Frail elderly  woman in no acute distress  Cardiovascular:     Rate and Rhythm: Normal rate and regular rhythm.     Heart sounds: Normal heart sounds.  Pulmonary:     Effort: Pulmonary effort is normal.     Breath sounds: Normal breath sounds.  Abdominal:     General: Bowel sounds are normal.     Palpations: Abdomen is soft.  Skin:    General: Skin is warm and dry.  Neurological:     Mental Status: She is alert and oriented to person, place, and time.    Breast exam was performed in seated and lying down position. Patient is status post right lumpectomy with a well-healed surgical scar.  There is evidence of skin thickening and induration at the site of right breast radiation.  No evidence of any palpable masses. No evidence of axillary adenopathy. No evidence of any palpable masses or lumps in the left breast. No evidence of leftt axillary adenopathy   CMP Latest Ref Rng & Units 01/19/2019  Glucose 70 - 99 mg/dL 104(H)  BUN 8 - 23 mg/dL 27(H)  Creatinine 0.44 - 1.00 mg/dL 0.56  Sodium 135 - 145 mmol/L 142  Potassium 3.5 - 5.1 mmol/L 4.0  Chloride 98 - 111 mmol/L 111  CO2 22 - 32 mmol/L 22  Calcium 8.9 - 10.3 mg/dL 8.5(L)  Total Protein 6.5 - 8.1 g/dL -  Total Bilirubin 0.3 - 1.2 mg/dL -  Alkaline Phos 38 - 126 U/L -  AST 15 - 41 U/L -  ALT 0 - 44 U/L -   CBC Latest Ref Rng & Units 03/16/2019  WBC 4.0 - 10.5 K/uL 6.5  Hemoglobin 12.0 - 15.0 g/dL 12.1  Hematocrit 36 - 46 % 39.0  Platelets 150 - 400 K/uL 233     Assessment and plan- Patient is a 85 y.o. female with stage I right breast cancer invasive lobular carcinoma s/p lumpectomyadjuvant radiation treatment and 5 years of tamoxifen.  She is here for routine follow-up  In May 2021 patient underwent MRI bilateral breasts given the concern for right breast swelling which showed chronic skin thickening from  radiation but no evidence of malignancy.  Presently patient is doing well for her age and remains independent of her ADLs.  She still  struggles with her balance and has occasional falls.  Her breast exam today again shows induration at the site of surgery and radiation but no palpable masses in either breast.  Patient will be getting her yearly mammograms in August 2022.  I will see her back in 6 months for an in person visit and following that we will keep it as a yearly visit   Visit Diagnosis 1. Encounter for follow-up surveillance of breast cancer      Dr. Randa Evens, MD, MPH [facility] [phone] 12/14/2019 9:39 AM

## 2020-01-03 ENCOUNTER — Other Ambulatory Visit: Payer: Self-pay

## 2020-01-03 ENCOUNTER — Encounter: Payer: Self-pay | Admitting: Student in an Organized Health Care Education/Training Program

## 2020-01-03 ENCOUNTER — Ambulatory Visit
Payer: Medicare Other | Attending: Student in an Organized Health Care Education/Training Program | Admitting: Student in an Organized Health Care Education/Training Program

## 2020-01-03 VITALS — BP 106/97 | HR 77 | Temp 98.4°F | Resp 18 | Ht 60.0 in | Wt 110.0 lb

## 2020-01-03 DIAGNOSIS — G8929 Other chronic pain: Secondary | ICD-10-CM | POA: Diagnosis present

## 2020-01-03 DIAGNOSIS — G894 Chronic pain syndrome: Secondary | ICD-10-CM | POA: Insufficient documentation

## 2020-01-03 DIAGNOSIS — M961 Postlaminectomy syndrome, not elsewhere classified: Secondary | ICD-10-CM | POA: Insufficient documentation

## 2020-01-03 DIAGNOSIS — M792 Neuralgia and neuritis, unspecified: Secondary | ICD-10-CM | POA: Diagnosis not present

## 2020-01-03 DIAGNOSIS — M5416 Radiculopathy, lumbar region: Secondary | ICD-10-CM | POA: Diagnosis not present

## 2020-01-03 MED ORDER — BUPRENORPHINE 7.5 MCG/HR TD PTWK: 1.0000 | MEDICATED_PATCH | TRANSDERMAL | 0 refills | Status: DC

## 2020-01-03 NOTE — Progress Notes (Signed)
Safety precautions to be maintained throughout the outpatient stay will include: orient to surroundings, keep bed in low position, maintain call bell within reach at all times, provide assistance with transfer out of bed and ambulation.  

## 2020-01-03 NOTE — Patient Instructions (Signed)
Butrans to last until 02/02/20 has been escribed to your pharmacy.

## 2020-01-03 NOTE — Progress Notes (Signed)
PROVIDER NOTE: Information contained herein reflects review and annotations entered in association with encounter. Interpretation of such information and data should be left to medically-trained personnel. Information provided to patient can be located elsewhere in the medical record under "Patient Instructions". Document created using STT-dictation technology, any transcriptional errors that may result from process are unintentional.    Patient: Julie Mckinney  Service Category: E/M  Provider: Gillis Santa, MD  DOB: 03/12/1934  DOS: 01/03/2020  Specialty: Interventional Pain Management  MRN: [id number]  Setting: Ambulatory outpatient  PCP: Dion Body, MD  Type: Established Patient    Referring Provider: Dion Body, MD  Location: Office  Delivery: Face-to-face     HPI  [name], a 85 y.o. year old female, is here today because of her Failed back surgical syndrome [M96.1]. [name]'s primary complain today is Back Pain Last encounter: My last encounter with her was on 12/06/2019. Pertinent problems: Julie Mckinney has History of CVA (cerebrovascular accident) without residual deficits; MDD (major depressive disorder), recurrent episode, mild ([place]); T10 vertebral fracture ([place]); Closed stable burst fracture of T12 vertebra ([place]); Failed back surgical syndrome; MDD (major depressive disorder), recurrent, in full remission ([place]); Pain management contract signed; and Chronic pain syndrome on their pertinent problem list. Pain Assessment: Severity of Chronic pain is reported as a 9 /10. Location: Back Left,Lower/all the way to the left foot. Onset: More than a month ago. Quality: Constant,Stabbing,Throbbing. Timing: Constant. Modifying factor(s): Laying down.. Vitals:  height is 5' (1.524 m) and weight is 110 lb (49.9 kg). Her temporal temperature is 98.4 F (36.9 C). Her blood pressure is 106/97 (abnormal) and her pulse is 77. Her respiration is 18 and oxygen saturation is  98%.   Reason for encounter: medication management.   Patient follows up today for medication management.  She is endorsing at best mild pain relief from her Butrans patch.  She states that she is sleeping more comfortably and has less pain when she wakes up.  No side effects of constipation, dizziness, nausea, headaches.  Discussed increasing Butrans patch to 7.5 mcg in the hopes of optimizing analgesic benefit.  Pharmacotherapy Assessment   Analgesic: increase Butrans patch to 7.5 mcg/hr   Monitoring: [place] PMP: PDMP reviewed during this encounter.       Pharmacotherapy: No side-effects or adverse reactions reported. Compliance: No problems identified. Effectiveness: Clinically acceptable.  Charna Busman, NT  01/03/2020  2:10 PM  Sign when Signing Visit Safety precautions to be maintained throughout the outpatient stay will include: orient to surroundings, keep bed in low position, maintain call bell within reach at all times, provide assistance with transfer out of bed and ambulation.     UDS: No results found for: SUMMARY   ROS  Constitutional: Denies any fever or chills Gastrointestinal: No reported hemesis, hematochezia, vomiting, or acute GI distress Musculoskeletal: Positive low back pain Neurological: No reported episodes of acute onset apraxia, aphasia, dysarthria, agnosia, amnesia, paralysis, loss of coordination, or loss of consciousness  Medication Review  Azelastine-Fluticasone, Fluticasone-Umeclidin-Vilant, Glucosamine Sulfate, Multi-Vitamins, Nebulizers, Olopatadine HCl, Potassium, albuterol, atorvastatin, buprenorphine, busPIRone, calcium carbonate, citalopram, fluticasone, levothyroxine, omeprazole, traMADol, and vitamin B-12  History Review  Allergy: Julie Mckinney is allergic to tape. Drug: Julie Mckinney  reports no history of drug use. Alcohol:  reports current alcohol use of about 1.0 standard drink of alcohol per week. Tobacco:  reports that she quit smoking about 24  years ago. Her smoking use included cigarettes. She has a 15.00 pack-year smoking history. She has never  used smokeless tobacco. Social: Julie Mckinney  reports that she quit smoking about 24 years ago. Her smoking use included cigarettes. She has a 15.00 pack-year smoking history. She has never used smokeless tobacco. She reports current alcohol use of about 1.0 standard drink of alcohol per week. She reports that she does not use drugs. Medical:  has a past medical history of Anxiety, Asthma, Breast cancer ([place]) (Right), COPD (chronic obstructive pulmonary disease) ([place]), Depression, and Personal history of radiation therapy. Surgical: Julie Mckinney  has a past surgical history that includes Appendectomy; Colonoscopy; Cholecystectomy; Abdominal hysterectomy; Esophagogastroduodenoscopy (egd) with propofol (N/A, 08/14/2016); Colonoscopy with propofol (N/A, 08/14/2016); Back surgery; Breast biopsy (Right, 2015); Breast excisional biopsy (Right, 1975); and Breast lumpectomy (Right, 2015). Family: family history includes Cancer in her father.  Laboratory Chemistry Profile   Renal Lab Results  Component Value Date   BUN 27 (H) 01/19/2019   CREATININE 0.56 01/19/2019   GFRAA >60 01/19/2019   GFRNONAA >60 01/19/2019     Hepatic Lab Results  Component Value Date   AST 20 01/17/2019   ALT 18 01/17/2019   ALBUMIN 3.5 01/17/2019   ALKPHOS 34 (L) 01/17/2019     Electrolytes Lab Results  Component Value Date   NA 142 01/19/2019   K 4.0 01/19/2019   CL 111 01/19/2019   CALCIUM 8.5 (L) 01/19/2019     Bone No results found for: VD25OH, VD125OH2TOT, ER1540GQ6, PY1950DT2, 25OHVITD1, 25OHVITD2, 25OHVITD3, TESTOFREE, TESTOSTERONE   Inflammation (CRP: Acute Phase) (ESR: Chronic Phase) No results found for: CRP, ESRSEDRATE, LATICACIDVEN     Note: Above Lab results reviewed.  Recent Imaging Review  DG PAIN CLINIC C-ARM 1-60 MIN NO REPORT Fluoro was used, but no Radiologist interpretation will be  provided.  Please refer to "NOTES" tab for provider progress note. Note: Reviewed        Physical Exam  General appearance: Well nourished, well developed, and well hydrated. In no apparent acute distress Mental status: Alert, oriented x 3 (person, place, & time)       Respiratory: No evidence of acute respiratory distress Eyes: PERLA Vitals: BP (!) 106/97 (BP Location: Right Arm, Patient Position: Sitting, Cuff Size: Normal)   Pulse 77   Temp 98.4 F (36.9 C) (Temporal)   Resp 18   Ht 5' (1.524 m)   Wt 110 lb (49.9 kg)   SpO2 98%   BMI 21.48 kg/m  BMI: Estimated body mass index is 21.48 kg/m as calculated from the following:   Height as of this encounter: 5' (1.524 m).   Weight as of this encounter: 110 lb (49.9 kg). Ideal: Ideal body weight: 45.5 kg (100 lb 4.9 oz) Adjusted ideal body weight: 47.3 kg (104 lb 3 oz)   Lumbar Exam  Skin & Axial Inspection:Thoraco-lumbar Scoliosis Alignment:Scoliosis detected Functional IZT:IWPY restricted ROMaffecting both sides Stability:No instability detected Muscle Tone/Strength:Functionally intact. No obvious neuro-muscular anomalies detected. Sensory (Neurological):Dermatomal pain patternbilaterally Palpation:Tender to palpation Provocative Tests: Hyperextension/rotation test:deferred today Lumbar quadrant test (Kemp's test):(+)due to pain. Lateral bending test:deferred today Patrick's Maneuver:deferred today FABER* test:deferred today S-I anterior distraction/compression test:deferred today S-I lateral compression test:deferred today S-I Thigh-thrust test:deferred today S-I Gaenslen's test:deferred today *(Flexion, ABduction and External Rotation)  Gait & Posture Assessment  Ambulation:Limited Gait:Antalgic Posture:Difficulty standing up straight, due to pain  Lower Extremity Exam    Side:Right lower extremity   Side:Left lower extremity  Stability:No instability observed  Stability:No instability observed  Skin & Extremity Inspection:Skin color, temperature, and hair growth are WNL. No peripheral  edema or cyanosis. No masses, redness, swelling, asymmetry, or associated skin lesions. No contractures.  Skin & Extremity Inspection:Skin color, temperature, and hair growth are WNL. No peripheral edema or cyanosis. No masses, redness, swelling, asymmetry, or associated skin lesions. No contractures.  Functional TMH:DQQI restricted ROMfor all joints of the lower extremity   Functional WLN:LGXQ restricted ROMfor all joints of the lower extremity   Muscle Tone/Strength:Functionally intact. No obvious neuro-muscular anomalies detected.  Muscle Tone/Strength:Functionally intact. No obvious neuro-muscular anomalies detected.  Sensory (Neurological):Dermatomal pain pattern  Sensory (Neurological):Dermatomal pain pattern  DTR: Patellar:deferred today Achilles:deferred today Plantar:deferred today  DTR: Patellar:deferred today Achilles:deferred today Plantar:deferred today  Palpation:No palpable anomalies  Palpation:No palpable anomalies      Assessment   Status Diagnosis  Persistent Persistent Persistent 1. Failed back surgical syndrome   2. Lumbar post-laminectomy syndrome   3. Chronic radicular lumbar pain (LEFT)   4. Intractable neuropathic pain of left foot   5. Chronic pain syndrome       Plan of Care   Ms. Alyson Ki has a current medication list which includes the following long-term medication(s): albuterol, azelastine-fluticasone, calcium carbonate, citalopram, fluticasone, levothyroxine, omeprazole, and potassium.  Pharmacotherapy (Medications Ordered): Meds ordered this encounter  Medications  . buprenorphine (BUTRANS) 7.5 MCG/HR    Sig: Place 1 patch onto the skin every 7 (seven) days.    Dispense:  4 patch     Refill:  0    For chronic pain syndrome   Follow-up plan:   Return in about 4 weeks (around 01/31/2020) for Medication Management (VV or F2F up to pt).   Recent Visits Date Type Provider Dept  12/06/19 Office Visit Gillis Santa, MD [facility]  11/08/19 Office Visit Gillis Santa, MD [facility]  Showing recent visits within past 90 days and meeting all other requirements Today's Visits Date Type Provider Dept  01/03/20 Office Visit Gillis Santa, MD [facility]  Showing today's visits and meeting all other requirements Future Appointments Date Type Provider Dept  01/31/20 Appointment Gillis Santa, MD [facility]  Showing future appointments within next 90 days and meeting all other requirements  I discussed the assessment and treatment plan with the patient. The patient was provided an opportunity to ask questions and all were answered. The patient agreed with the plan and demonstrated an understanding of the instructions.  Patient advised to call back or seek an in-person evaluation if the symptoms or condition worsens.  Duration of encounter:30 minutes.  Note by: Gillis Santa, MD Date: 01/03/2020; Time: 3:25 PM

## 2020-01-27 ENCOUNTER — Encounter: Payer: Self-pay | Admitting: Student in an Organized Health Care Education/Training Program

## 2020-01-31 ENCOUNTER — Other Ambulatory Visit: Payer: Self-pay

## 2020-01-31 ENCOUNTER — Ambulatory Visit
Payer: Medicare Other | Attending: Student in an Organized Health Care Education/Training Program | Admitting: Student in an Organized Health Care Education/Training Program

## 2020-01-31 DIAGNOSIS — M792 Neuralgia and neuritis, unspecified: Secondary | ICD-10-CM

## 2020-01-31 DIAGNOSIS — M961 Postlaminectomy syndrome, not elsewhere classified: Secondary | ICD-10-CM

## 2020-01-31 DIAGNOSIS — M5416 Radiculopathy, lumbar region: Secondary | ICD-10-CM | POA: Diagnosis not present

## 2020-01-31 DIAGNOSIS — G894 Chronic pain syndrome: Secondary | ICD-10-CM

## 2020-01-31 DIAGNOSIS — G8929 Other chronic pain: Secondary | ICD-10-CM

## 2020-01-31 MED ORDER — BUPRENORPHINE 10 MCG/HR TD PTWK: 1.0000 | MEDICATED_PATCH | TRANSDERMAL | 0 refills | Status: DC

## 2020-01-31 NOTE — Progress Notes (Signed)
Patient: Julie Mckinney  Service Category: E/M  Provider: Gillis Santa, MD  DOB: 1934/09/15  DOS: 01/31/2020  Location: Office  MRN: [id number]  Setting: Ambulatory outpatient  Referring Provider: Dion Body, MD  Type: Established Patient  Specialty: Interventional Pain Management  PCP: Dion Body, MD  Location: Home  Delivery: TeleHealth     Virtual Encounter - Pain Management PROVIDER NOTE: Information contained herein reflects review and annotations entered in association with encounter. Interpretation of such information and data should be left to medically-trained personnel. Information provided to patient can be located elsewhere in the medical record under "Patient Instructions". Document created using STT-dictation technology, any transcriptional errors that may result from process are unintentional.    Contact & Pharmacy Preferred: [phone] Home: [phone] (home) Mobile: There is no such number on file (mobile). E-mail: [email or web address] .[place], [place] [address] [place] [address] Phone: [phone] Fax: [phone]  [facility] [place], [place] [address] Phone: [phone] Fax: [phone]   Pre-screening  [name] offered "in-person" vs "virtual" encounter. She indicated preferring virtual for this encounter.   Reason COVID-19*  Social distancing based on CDC and AMA recommendations.   [name] on 01/31/2020 via video conference.      I clearly identified myself as Gillis Santa, MD. I verified that I was speaking with the correct person using two identifiers (Name: Julie Mckinney, and date of birth: 03-25-1934).  Consent I sought verbal advanced consent from Julie Mckinney for virtual visit interactions. I informed Julie Mckinney of possible security and privacy concerns, risks, and  limitations associated with providing "not-in-person" medical evaluation and management services. I also informed Julie Mckinney of the availability of "in-person" appointments. Finally, I informed her that there would be a charge for the virtual visit and that she could be  personally, fully or partially, financially responsible for it. Julie Mckinney expressed understanding and agreed to proceed.   Historic Elements   Julie Mckinney is a 86 y.o. year old, female patient evaluated today after our last contact on 01/03/2020. Julie Mckinney  has a past medical history of Anxiety, Asthma, Breast cancer ([place]) (Right), COPD (chronic obstructive pulmonary disease) ([place]), Depression, and Personal history of radiation therapy. She also  has a past surgical history that includes Appendectomy; Colonoscopy; Cholecystectomy; Abdominal hysterectomy; Esophagogastroduodenoscopy (egd) with propofol (N/A, 08/14/2016); Colonoscopy with propofol (N/A, 08/14/2016); Back surgery; Breast biopsy ([place], 2015); Breast excisional biopsy (Right, 1975); and Breast lumpectomy (Right, 2015). Julie Mckinney has a current medication list which includes the following prescription(s): albuterol, atorvastatin, azelastine-fluticasone, buprenorphine, buspirone, calcium carbonate, citalopram, fluticasone, trelegy ellipta, glucosamine sulfate, levothyroxine, multi-vitamins, omeprazole, pataday, potassium, tramadol, vitamin b-12, and nebulizers. She  reports that she quit smoking about 24 years ago. Her smoking use included cigarettes. She has a 15.00 pack-year smoking history. She has never used smokeless tobacco. She reports current alcohol use of about 1.0 standard drink of alcohol per week. She reports that she does not use drugs. Julie Mckinney is allergic to tape.   HPI  Today, she is being contacted for medication management.  Patient states that she is noticing mild improvement in her pain after increasing her Butrans patch from 5 to 7.5 mcg  an hour.  We discussed increasing her Butrans patch to 10 mcg an hour as the patient has very limited options at this time.  She is also requesting to resume her  tramadol.  She has approximately 20 tablets left that were prescribed by physical medicine and rehab.  I informed her that she can take 1 to 2 tablets a day as needed for severe breakthrough pain but that she should try and see if the higher dose of buprenorphine results in better pain management.  Patient endorsed understanding.  Pharmacotherapy Assessment  Analgesic: increase Butrans patch to 60mg/hr   Monitoring: [place] PMP: PDMP reviewed during this encounter.       Pharmacotherapy: No side-effects or adverse reactions reported. Compliance: No problems identified. Effectiveness: Clinically acceptable. Plan: Refer to "POC".  UDS: No results found for: SUMMARY  Laboratory Chemistry Profile   Renal Lab Results  Component Value Date   BUN 27 (H) 01/19/2019   CREATININE 0.56 01/19/2019   GFRAA >60 01/19/2019   GFRNONAA >60 01/19/2019     Hepatic Lab Results  Component Value Date   AST 20 01/17/2019   ALT 18 01/17/2019   ALBUMIN 3.5 01/17/2019   ALKPHOS 34 (L) 01/17/2019     Electrolytes Lab Results  Component Value Date   NA 142 01/19/2019   K 4.0 01/19/2019   CL 111 01/19/2019   CALCIUM 8.5 (L) 01/19/2019     Bone No results found for: VD25OH, VD125OH2TOT, [id number] 25OHVITD1, 25OHVITD2, 25OHVITD3, TESTOFREE, TESTOSTERONE   Inflammation (CRP: Acute Phase) (ESR: Chronic Phase) No results found for: CRP, ESRSEDRATE, LATICACIDVEN     Note: Above Lab results reviewed.  Assessment  The primary encounter diagnosis was Failed back surgical syndrome. Diagnoses of Lumbar post-laminectomy syndrome, Chronic radicular lumbar pain (LEFT), Intractable neuropathic pain of left foot, and Chronic pain syndrome were also pertinent to this visit.  Plan of Care   Ms. DAshantee Deupreehas a current medication list  which includes the following long-term medication(s): albuterol, azelastine-fluticasone, calcium carbonate, citalopram, fluticasone, levothyroxine, omeprazole, and potassium.  Pharmacotherapy (Medications Ordered): Meds ordered this encounter  Medications  . buprenorphine (BUTRANS) 10 MCG/HR PTWK    Sig: Place 1 patch onto the skin every 7 (seven) days.    Dispense:  4 patch    Refill:  0    Do not place this medication, or any other prescription from our practice, on "Automatic Refill".   Follow-up plan:   Return in about 22 days (around 02/22/2020) for Medication Management, virtual.   Recent Visits Date Type Provider Dept  01/03/20 Office Visit LGillis Santa MD [facility]  12/06/19 Office Visit LGillis Santa MD [facility]  11/08/19 Office Visit LGillis Santa MD [facility]  Showing recent visits within past 90 days and meeting all other requirements Today's Visits Date Type Provider Dept  01/31/20 Telemedicine LGillis Santa MD [facility]  Showing today's visits and meeting all other requirements Future Appointments No visits were found meeting these conditions. Showing future appointments within next 90 days and meeting all other requirements  I discussed the assessment and treatment plan with the patient. The patient was provided an opportunity to ask questions and all were answered. The patient agreed with the plan and demonstrated an understanding of the instructions.  Patient advised to call back or seek an in-person evaluation if the symptoms or condition worsens.  Duration of encounter: 30 minutes.  Note by: BGillis Santa MD Date: 01/31/2020; Time: 3:12 PM

## 2020-02-11 ENCOUNTER — Other Ambulatory Visit: Payer: Self-pay

## 2020-02-11 ENCOUNTER — Telehealth (INDEPENDENT_AMBULATORY_CARE_PROVIDER_SITE_OTHER): Payer: Medicare Other | Admitting: Psychiatry

## 2020-02-11 ENCOUNTER — Encounter: Payer: Self-pay | Admitting: Psychiatry

## 2020-02-11 DIAGNOSIS — F3342 Major depressive disorder, recurrent, in full remission: Secondary | ICD-10-CM | POA: Diagnosis not present

## 2020-02-11 DIAGNOSIS — F411 Generalized anxiety disorder: Secondary | ICD-10-CM

## 2020-02-11 DIAGNOSIS — G4701 Insomnia due to medical condition: Secondary | ICD-10-CM | POA: Diagnosis not present

## 2020-02-11 MED ORDER — BUSPIRONE HCL 7.5 MG PO TABS: 7.5000 mg | ORAL_TABLET | Freq: Two times a day (BID) | ORAL | 1 refills | Status: DC

## 2020-02-11 MED ORDER — CITALOPRAM HYDROBROMIDE 20 MG PO TABS: 30.0000 mg | ORAL_TABLET | Freq: Every day | ORAL | 1 refills | Status: DC

## 2020-02-11 NOTE — Progress Notes (Signed)
Virtual Visit via Telephone Note  I connected with Julie Mckinney on 02/11/20 at 11:20 AM EST by telephone and verified that I am speaking with the correct person using two identifiers.  Location Provider Location : [facility] Patient Location : Home  Participants: Patient , Provider    I discussed the limitations, risks, security and privacy concerns of performing an evaluation and management service by telephone and the availability of in person appointments. I also discussed with the patient that there may be a patient responsible charge related to this service. The patient expressed understanding and agreed to proceed.    I discussed the assessment and treatment plan with the patient. The patient was provided an opportunity to ask questions and all were answered. The patient agreed with the plan and demonstrated an understanding of the instructions.   The patient was advised to call back or seek an in-person evaluation if the symptoms worsen or if the condition fails to improve as anticipated.  [facility] MD OP Progress Note  02/11/2020 11:39 AM Jasiel Florencio  MRN:  [id number]  Chief Complaint:  Chief Complaint    Follow-up     HPI: Jasmeen Geiss is a 86 year old Caucasian female, lives in a senior living community in [place], married, has a history of MDD, GAD, COPD, chronic pain, rheumatoid arthritis, history of breast cancer in remission on tamoxifen, chronic pain, was evaluated by phone today.  Patient today reports she continues to struggle with a lot of pain.  She has back pain and is currently following up with Dr. Holley Raring.  She reports she is currently on Butrans patch as well as takes tramadol twice a day.  In spite of that she reports her current pain is at 8 out of 10 with 10 being the worst.  She continues to follow-up with her provider for the same.  She is trying to manage it as best as she can.  She reports her pain does make her anxious however the current combination  of medication as beneficial.  She is compliant on her BuSpar and Celexa.  She denies side effects.  She reports sleep is overall okay.  She reports her appetite is fair.  Patient denies any suicidality, homicidality or perceptual disturbances.  Patient denies any other concerns today.  Visit Diagnosis:    ICD-10-CM   1. MDD (major depressive disorder), recurrent, in full remission ([place])  F33.42 citalopram (CELEXA) 20 MG tablet  2. GAD (generalized anxiety disorder)  F41.1 citalopram (CELEXA) 20 MG tablet    busPIRone (BUSPAR) 7.5 MG tablet  3. Insomnia due to medical condition  G47.01     Past Psychiatric History: I have reviewed past psychiatric history from my progress note on 10/23/2017.  Past trials of Celexa, mirtazapine, melatonin  Past Medical History:  Past Medical History:  Diagnosis Date  . Anxiety   . Asthma   . Breast cancer ([place]) Right  . COPD (chronic obstructive pulmonary disease) ([place])   . Depression   . Personal history of radiation therapy     Past Surgical History:  Procedure Laterality Date  . ABDOMINAL HYSTERECTOMY    . APPENDECTOMY    . BACK SURGERY    . BREAST BIOPSY Right 2015   +  . BREAST EXCISIONAL BIOPSY Right 1975   neg  . BREAST LUMPECTOMY Right 2015   invasive lobular carcinoma  . CHOLECYSTECTOMY    . COLONOSCOPY     Scheduled for July 2018  . COLONOSCOPY WITH PROPOFOL N/A 08/14/2016   Procedure:  COLONOSCOPY WITH PROPOFOL;  Surgeon: Manya Silvas, MD;  Location: [facility] ENDOSCOPY;  Service: Endoscopy;  Laterality: N/A;  . ESOPHAGOGASTRODUODENOSCOPY (EGD) WITH PROPOFOL N/A 08/14/2016   Procedure: ESOPHAGOGASTRODUODENOSCOPY (EGD) WITH PROPOFOL;  Surgeon: Manya Silvas, MD;  [facility] END[facility]COPY;  Service: Endoscopy;  Laterality: N/A;    Family Psychiatric History: I have reviewed family psychiatric history from my progress note on 10/23/2017  Family History:  Family History  Problem Relation Age of Onset  . Cancer Father   .  Breast cancer Neg Hx     Social History: Reviewed social history from my progress note on 10/23/2017 Social History   Socioeconomic History  . Marital status: Married    Spouse name: ronald  . Number of children: 2  . Years of education: Not on file  . Highest education level: Some college, no degree  Occupational History  . Not on file  Tobacco Use  . Smoking status: Former Smoker    Packs/day: 0.50    Years: 30.00    Pack years: 15.00    Types: Cigarettes    Quit date: 06/22/1995    Years since quitting: 24.6  . Smokeless tobacco: Never Used  Vaping Use  . Vaping Use: Never used  Substance and Sexual Activity  . Alcohol use: Yes    Alcohol/week: 1.0 standard drink    Types: 1 Glasses of wine per week    Comment: Occasional-rare  . Drug use: No  . Sexual activity: Not Currently  Other Topics Concern  . Not on file  Social History Narrative  . Not on file   Social Determinants of Health   Financial Resource Strain: Not on file  Food Insecurity: Not on file  Transportation Needs: Not on file  Physical Activity: Not on file  Stress: Not on file  Social Connections: Not on file    Allergies:  Allergies  Allergen Reactions  . Tape Rash    Metabolic Disorder Labs: No results found for: HGBA1C, MPG No results found for: PROLACTIN No results found for: CHOL, TRIG, HDL, CHOLHDL, VLDL, LDLCALC No results found for: TSH  Therapeutic Level Labs: No results found for: LITHIUM No results found for: VALPROATE No components found for:  CBMZ  Current Medications: Current Outpatient Medications  Medication Sig Dispense Refill  . clobetasol cream (TEMOVATE) 0.05 % Apply topically.    Marland Kitchen albuterol (PROVENTIL HFA;VENTOLIN HFA) 108 (90 Base) MCG/ACT inhaler Inhale 2 puffs into the lungs every 6 (six) hours as needed.     Marland Kitchen atorvastatin (LIPITOR) 40 MG tablet TAKE ONE TABLET BY MOUTH EVERY EVENING    . Azelastine-Fluticasone 137-50 MCG/ACT SUSP Place 1 spray into the nose  daily.     . buprenorphine (BUTRANS) 10 MCG/HR PTWK Place 1 patch onto the skin every 7 (seven) days. 4 patch 0  . busPIRone (BUSPAR) 7.5 MG tablet Take 1 tablet (7.5 mg total) by mouth 2 (two) times daily. 180 tablet 1  . calcium carbonate (OS-CAL) 600 MG TABS tablet Take 600 mg by mouth 2 (two) times daily.    . citalopram (CELEXA) 20 MG tablet Take 1.5 tablets (30 mg total) by mouth daily. 135 tablet 1  . fluticasone (FLONASE) 50 MCG/ACT nasal spray Place 1 spray into the nose as needed.    . Fluticasone-Umeclidin-Vilant (TRELEGY ELLIPTA) 100-62.5-25 MCG/INH AEPB Inhale 1 puff into the lungs daily.    Marland Kitchen GLUCOSAMINE SULFATE PO Take 1 capsule by mouth 2 (two) times daily.    Marland Kitchen levothyroxine (SYNTHROID, LEVOTHROID)  50 MCG tablet Take 50 mcg by mouth daily.    . Multiple Vitamin (MULTI-VITAMINS) TABS Take 1 tablet by mouth daily.    . Nebulizers MISC Inhale 1 Dose into the lungs as needed.     Marland Kitchen omeprazole (PRILOSEC) 20 MG capsule Take 20 mg by mouth daily.    Marland Kitchen PATADAY 0.2 % SOLN Apply 1 drop to eye as needed.     . Potassium 99 MG TABS Take 1 tablet by mouth daily.     . traMADol (ULTRAM) 50 MG tablet Take 50 mg by mouth 2 (two) times daily as needed.    . vitamin B-12 (CYANOCOBALAMIN) 1000 MCG tablet Take 1,000 mcg by mouth daily.     No current facility-administered medications for this visit.     Musculoskeletal: Strength & Muscle Tone: UTA Gait & Station: UTA Patient leans: N/A  Psychiatric Specialty Exam: Review of Systems  Musculoskeletal: Positive for back pain.  Psychiatric/Behavioral: The patient is nervous/anxious.   All other systems reviewed and are negative.   There were no vitals taken for this visit.There is no height or weight on file to calculate BMI.  General Appearance: UTA  Eye Contact:  UTA  Speech:  Normal Rate  Volume:  Normal  Mood:  Anxious coping well  Affect:  UTA  Thought Process:  Goal Directed and Descriptions of Associations: Intact   Orientation:  Full (Time, Place, and Person)  Thought Content: Logical   Suicidal Thoughts:  No  Homicidal Thoughts:  No  Memory:  Immediate;   Fair Recent;   Fair Remote;   Fair  Judgement:  Fair  Insight:  Fair  Psychomotor Activity:  UTA  Concentration:  Concentration: Fair and Attention Span: Fair  Recall:  [employer] of Knowledge: Fair  Language: Fair  Akathisia:  No  Handed:  Right  AIMS (if indicated): UTA  Assets:  Communication Skills Desire for Improvement Housing Social Support  ADL's:  Intact  Cognition: WNL  Sleep:  Fair   Screenings: PHQ2-9   [place] Office Visit from 12/06/2019 in [facility]  PHQ-2 Total Score 0       Assessment and Plan: Evalisse Prajapati is a 86 year old female, married, lives in an independent senior living community in [place], has a history of MDD, GAD, sleep problems, chronic pain, history of spinal fracture, COPD, breast cancer in remission was evaluated by telemedicine today.  Patient is currently stable with regards to her depression and anxiety.  Plan as noted below.  Plan MDD in full remission Celexa 30 mg p.o. daily  GAD-stable BuSpar 7.5 mg p.o. twice daily Celexa 30 mg p.o. daily Hydroxyzine 25-50 mg p.o. daily as needed  Insomnia-stable We will monitor closely  Patient will continue to follow-up with her pain provider for management of her pain.  Discussed drug to drug interaction including interaction with tramadol with her SSRI.  Discussed serotonin syndrome.  Follow-up in clinic in 2 to 3 months or sooner if needed.  I have spent atleast  18 minutes non face to face  with patient today. More than 50 % of the time was spent for preparing to see the patient ( e.g., review of test, records ), ordering medications and test ,psychoeducation and supportive psychotherapy and care coordination,as well as documenting clinical information in electronic health record. This  note was generated in part or whole with voice recognition software. Voice recognition is usually quite accurate but there are transcription errors that can  and very often do occur. I apologize for any typographical errors that were not detected and corrected.       Ursula Alert, MD 02/11/2020, 11:39 AM

## 2020-02-21 ENCOUNTER — Telehealth: Payer: Self-pay

## 2020-02-21 NOTE — Telephone Encounter (Signed)
LM for patient to call office for pre virtual appointment questions.  

## 2020-02-22 ENCOUNTER — Other Ambulatory Visit: Payer: Self-pay

## 2020-02-22 ENCOUNTER — Encounter: Payer: Self-pay | Admitting: Student in an Organized Health Care Education/Training Program

## 2020-02-22 ENCOUNTER — Ambulatory Visit
Payer: Medicare Other | Attending: Student in an Organized Health Care Education/Training Program | Admitting: Student in an Organized Health Care Education/Training Program

## 2020-02-22 DIAGNOSIS — M961 Postlaminectomy syndrome, not elsewhere classified: Secondary | ICD-10-CM | POA: Diagnosis not present

## 2020-02-22 DIAGNOSIS — M792 Neuralgia and neuritis, unspecified: Secondary | ICD-10-CM | POA: Diagnosis not present

## 2020-02-22 DIAGNOSIS — G8929 Other chronic pain: Secondary | ICD-10-CM

## 2020-02-22 DIAGNOSIS — G894 Chronic pain syndrome: Secondary | ICD-10-CM

## 2020-02-22 DIAGNOSIS — M5416 Radiculopathy, lumbar region: Secondary | ICD-10-CM

## 2020-02-22 DIAGNOSIS — Z0289 Encounter for other administrative examinations: Secondary | ICD-10-CM | POA: Diagnosis not present

## 2020-02-22 MED ORDER — TRAMADOL HCL 50 MG PO TABS: 50.0000 mg | ORAL_TABLET | Freq: Two times a day (BID) | ORAL | 0 refills | Status: DC | PRN

## 2020-02-22 MED ORDER — BUPRENORPHINE 15 MCG/HR TD PTWK: 1.0000 | MEDICATED_PATCH | TRANSDERMAL | 0 refills | Status: DC

## 2020-02-22 NOTE — Progress Notes (Signed)
Patient: Julie Mckinney  Service Category: E/M  Provider: Gillis Santa, MD  DOB: 07-26-1934  DOS: 02/22/2020  Location: Office  MRN: [id number]  Setting: Ambulatory outpatient  Referring Provider: Dion Body, MD  Type: Established Patient  Specialty: Interventional Pain Management  PCP: Julie Body, MD  Location: Home  Delivery: TeleHealth     Virtual Encounter - Pain Management PROVIDER NOTE: Information contained herein reflects review and annotations entered in association with encounter. Interpretation of such information and data should be left to medically-trained personnel. Information provided to patient can be located elsewhere in the medical record under "Patient Instructions". Document created using STT-dictation technology, any transcriptional errors that may result from process are unintentional.    Contact & Pharmacy Preferred: [phone] Home: [phone] (home) Mobile: There is no such number on file (mobile). E-mail: [email or web address]  [facility], [place] [facility] [place] [address] Phone: [phone] Fax: [phone]   Pre-screening  Julie Mckinney offered "in-person" vs "virtual" encounter. She indicated preferring virtual for this encounter.   Reason COVID-19*  Social distancing based on CDC and AMA recommendations.   [name] on 02/22/2020 via telephone.      I clearly identified myself as 15/01/2020, MD. I verified that I was speaking with the correct person using two identifiers (Name: Julie Mckinney, and date of birth: [birth year]).  Consent I sought verbal advanced consent from 12/22/1934 for virtual visit interactions. I informed Julie Mckinney of possible security and privacy concerns, risks, and limitations associated with providing "not-in-person" medical evaluation and management services. I also informed Julie Mckinney of the availability of "in-person"  appointments. Finally, I informed her that there would be a charge for the virtual visit and that she could be  personally, fully or partially, financially responsible for it. Julie Mckinney expressed understanding and agreed to proceed.   Historic Elements   Julie Mckinney is a 86 y.o. year old, female patient evaluated today after our last contact on 01/03/2020. Julie Mckinney  has a past medical history of Anxiety, Asthma, Breast cancer ([place]) (Right), COPD (chronic obstructive pulmonary disease) ([place]), Depression, and Personal history of radiation therapy. She also  has a past surgical history that includes Appendectomy; Colonoscopy; Cholecystectomy; Abdominal hysterectomy; Esophagogastroduodenoscopy (egd) with propofol (N/A, 08/14/2016); Colonoscopy with propofol (N/A, 08/14/2016); Back surgery; Breast biopsy (Right, 2015); Breast excisional biopsy (Right, 1975); and Breast lumpectomy (Right, 2015). Julie Mckinney has a current medication list which includes the following prescription(s): albuterol, atorvastatin, azelastine-fluticasone, buprenorphine, buspirone, calcium carbonate, citalopram, clobetasol cream, fluticasone, trelegy ellipta, glucosamine sulfate, levothyroxine, multi-vitamins, nebulizers, omeprazole, pataday, potassium, vitamin b-12, and tramadol. She  reports that she quit smoking about 24 years ago. Her smoking use included cigarettes. She has a 15.00 pack-year smoking history. She has never used smokeless tobacco. She reports current alcohol use of about 1.0 standard drink of alcohol per week. She reports that she does not use drugs. Julie Mckinney is allergic to tape.   HPI  Today, she is being contacted for medication management.   Patient follows up virtually for medication management.  At her last clinic visit last month her Butrans patch was increased from 7.5 to 10 mcg an hour.  She does endorse a slightly better month due to the increase in the buprenorphine patch.  She is also  utilizing tramadol 50 mg twice a day to help out with her pain.  She is having some itching at the site of patch application so I instructed  her to apply fluticasone/Flonase spray and allowing it to dry prior to applying the patch.  Patient endorsed understanding.  Pharmacotherapy Assessment  Analgesic: increase Butrans patch to 11mg/hr, continue tramadol 50 mg twice daily as needed    Monitoring: [place] PMP: PDMP reviewed during this encounter.       Pharmacotherapy: No side-effects or adverse reactions reported. Compliance: No problems identified. Effectiveness: Clinically acceptable. Plan: Refer to "POC".   Laboratory Chemistry Profile   Renal Lab Results  Component Value Date   BUN 27 (H) 01/19/2019   CREATININE 0.56 01/19/2019   GFRAA >60 01/19/2019   GFRNONAA >60 01/19/2019     Hepatic Lab Results  Component Value Date   AST 20 01/17/2019   ALT 18 01/17/2019   ALBUMIN 3.5 01/17/2019   ALKPHOS 34 (L) 01/17/2019     Electrolytes Lab Results  Component Value Date   NA 142 01/19/2019   K 4.0 01/19/2019   CL 111 01/19/2019   CALCIUM 8.5 (L) 01/19/2019     Bone No results found for: VD25OH, VD125OH2TOT, VGB2010OF1 VQR9758IT2 25OHVITD1, 25OHVITD2, 25OHVITD3, TESTOFREE, TESTOSTERONE   Inflammation (CRP: Acute Phase) (ESR: Chronic Phase) No results found for: CRP, ESRSEDRATE, LATICACIDVEN     Note: Above Lab results reviewed.   Assessment  The primary encounter diagnosis was Failed back surgical syndrome. Diagnoses of Lumbar post-laminectomy syndrome, Chronic radicular lumbar pain (LEFT), Intractable neuropathic pain of left foot, Pain management contract signed, and Chronic pain syndrome were also pertinent to this visit.  Plan of Care   [name] a current medication list which includes the following long-term medication(s): albuterol, azelastine-fluticasone, calcium carbonate, citalopram, fluticasone, levothyroxine, omeprazole, and  potassium.  Pharmacotherapy (Medications Ordered): Meds ordered this encounter  Medications  . buprenorphine (BUTRANS) 15 MCG/HR    Sig: Place 1 patch onto the skin every 7 (seven) days.    Dispense:  4 patch    Refill:  0    For chronic pain syndrome  . traMADol (ULTRAM) 50 MG tablet    Sig: Take 1 tablet (50 mg total) by mouth 2 (two) times daily as needed.    Dispense:  60 tablet    Refill:  0   Follow-up plan:   Return in about 4 weeks (around 03/21/2020) for Medication Management, virtual.   Recent Visits Date Type Provider Dept  01/31/20 Telemedicine LGillis Santa MD [facility]  01/03/20 Office Visit LGillis Santa MD [facility]  12/06/19 Office Visit LGillis Santa MD [facility]  Showing recent visits within past 90 days and meeting all other requirements Today's Visits Date Type Provider Dept  02/22/20 Telemedicine LGillis Santa MD [facility]  Showing today's visits and meeting all other requirements Future Appointments No visits were found meeting these conditions. Showing future appointments within next 90 days and meeting all other requirements  I discussed the assessment and treatment plan with the patient. The patient was provided an opportunity to ask questions and all were answered. The patient agreed with the plan and demonstrated an understanding of the instructions.  Patient advised to call back or seek an in-person evaluation if the symptoms or condition worsens.  Duration of encounter:30 minutes.  Note by: BGillis Santa MD Date: 02/22/2020; Time: 2:34 PM

## 2020-02-22 NOTE — Telephone Encounter (Signed)
Spoke with patient this morning and updated chart, 02/22/20

## 2020-02-22 NOTE — Telephone Encounter (Signed)
Patient left vm stating all of her medications are still the same, nothing has changed

## 2020-03-14 ENCOUNTER — Ambulatory Visit: Payer: Medicare Other | Admitting: Oncology

## 2020-03-21 ENCOUNTER — Other Ambulatory Visit: Payer: Self-pay

## 2020-03-21 ENCOUNTER — Encounter: Payer: Self-pay | Admitting: Student in an Organized Health Care Education/Training Program

## 2020-03-21 ENCOUNTER — Ambulatory Visit
Payer: Medicare Other | Attending: Student in an Organized Health Care Education/Training Program | Admitting: Student in an Organized Health Care Education/Training Program

## 2020-03-21 DIAGNOSIS — M961 Postlaminectomy syndrome, not elsewhere classified: Secondary | ICD-10-CM | POA: Diagnosis not present

## 2020-03-21 DIAGNOSIS — M5416 Radiculopathy, lumbar region: Secondary | ICD-10-CM

## 2020-03-21 DIAGNOSIS — M792 Neuralgia and neuritis, unspecified: Secondary | ICD-10-CM | POA: Diagnosis not present

## 2020-03-21 DIAGNOSIS — G8929 Other chronic pain: Secondary | ICD-10-CM

## 2020-03-21 DIAGNOSIS — G894 Chronic pain syndrome: Secondary | ICD-10-CM

## 2020-03-21 DIAGNOSIS — Z0289 Encounter for other administrative examinations: Secondary | ICD-10-CM

## 2020-03-21 MED ORDER — TRAMADOL HCL 50 MG PO TABS: 50.0000 mg | ORAL_TABLET | Freq: Two times a day (BID) | ORAL | 1 refills | Status: DC | PRN

## 2020-03-21 MED ORDER — BUPRENORPHINE 15 MCG/HR TD PTWK: 1.0000 | MEDICATED_PATCH | TRANSDERMAL | 1 refills | Status: DC

## 2020-03-21 NOTE — Progress Notes (Signed)
Patient: Julie Mckinney  Service Category: E/M  Provider: Gillis Santa, MD  DOB: 12/26/1934  DOS: 03/21/2020  Location: Office  MRN: [id number]  Setting: Ambulatory outpatient  Referring Provider: Dion Body, MD  Type: Established Patient  Specialty: Interventional Pain Management  PCP: Dion Body, MD  Location: Home  Delivery: TeleHealth     Virtual Encounter - Pain Management PROVIDER NOTE: Information contained herein reflects review and annotations entered in association with encounter. Interpretation of such information and data should be left to medically-trained personnel. Information provided to patient can be located elsewhere in the medical record under "Patient Instructions". Document created using STT-dictation technology, any transcriptional errors that may result from process are unintentional.    Contact & Pharmacy Preferred: [phone] Home: [phone] (home) Mobile: There is no such number on file (mobile). E-mail: [email or web address]  [facility] [place], [place] - [address] [place] [address] Phone: [phone] Fax: [phone]   Pre-screening  [name] offered "in-person" vs "virtual" encounter. She indicated preferring virtual for this encounter.   Reason COVID-19*  Social distancing based on CDC and AMA recommendations.   I contacted Maciah Feeback on 03/21/2020 via video conference.      I clearly identified myself as Gillis Santa, MD. I verified that I was speaking with the correct person using two identifiers (Name: Gracey Tolle, and date of birth: September 17, 1934).  Consent I sought verbal advanced consent from [name] for virtual visit interactions. I informed Ms. Layson of possible security and privacy concerns, risks, and limitations associated with providing "not-in-person" medical evaluation and management services. I also informed Ms. Kemler of the availability of "in-person"  appointments. Finally, I informed her that there would be a charge for the virtual visit and that she could be  personally, fully or partially, financially responsible for it. Ms. Ottaway expressed understanding and agreed to proceed.   Historic Elements   Ms. Lilana Blasko is a 86 y.o. year old, female patient evaluated today after our last contact on 01/03/2020. Ms. Kirkland  has a past medical history of Anxiety, Asthma, Breast cancer ([place]) (Right), COPD (chronic obstructive pulmonary disease) ([place]), Depression, and Personal history of radiation therapy. She also  has a past surgical history that includes Appendectomy; Colonoscopy; Cholecystectomy; Abdominal hysterectomy; Esophagogastroduodenoscopy (egd) with propofol (N/A, 08/14/2016); Colonoscopy with propofol (N/A, 08/14/2016); Back surgery; Breast biopsy (Right, 2015); Breast excisional biopsy (Right, 1975); and Breast lumpectomy (Right, 2015). Ms. Bermingham has a current medication list which includes the following prescription(s): albuterol, atorvastatin, azelastine-fluticasone, buspirone, calcium carbonate, citalopram, clobetasol cream, fluticasone, trelegy ellipta, glucosamine sulfate, levothyroxine, multi-vitamins, nebulizers, omeprazole, pataday, potassium, vitamin b-12, buprenorphine, and tramadol. She  reports that she quit smoking about 24 years ago. Her smoking use included cigarettes. She has a 15.00 pack-year smoking history. She has never used smokeless tobacco. She reports current alcohol use of about 1.0 standard drink of alcohol per week. She reports that she does not use drugs. Ms. Hertzberg is allergic to tape.   HPI  Today, she is being contacted for medication management.   At her last visit, we increased her Butrans dose from 10 to 15 mcg/hr. She states that she is going out more and walking more as well.  Feels that overall pain is better.  Endorses analgesic and functional benefit from the recent increase Butrans dose.  We will  continue as it is for right now.  Follow-up in 8 weeks.  Pharmacotherapy Assessment  Analgesic: Butrans patch 44mg/hr, continue tramadol 50 mg twice  daily as needed    Monitoring: [place] PMP: PDMP reviewed during this encounter.       Pharmacotherapy: No side-effects or adverse reactions reported. Compliance: No problems identified. Effectiveness: Clinically acceptable. Plan: Refer to "POC".  Laboratory Chemistry Profile   Renal Lab Results  Component Value Date   BUN 27 (H) 01/19/2019   CREATININE 0.56 01/19/2019   GFRAA >60 01/19/2019   GFRNONAA >60 01/19/2019     Hepatic Lab Results  Component Value Date   AST 20 01/17/2019   ALT 18 01/17/2019   ALBUMIN 3.5 01/17/2019   ALKPHOS 34 (L) 01/17/2019     Electrolytes Lab Results  Component Value Date   NA 142 01/19/2019   K 4.0 01/19/2019   CL 111 01/19/2019   CALCIUM 8.5 (L) 01/19/2019     Bone No results found for: VD25OH, VD125OH2TOT, TD9741UL8, GT3646OE3, 25OHVITD1, 25OHVITD2, 25OHVITD3, TESTOFREE, TESTOSTERONE   Inflammation (CRP: Acute Phase) (ESR: Chronic Phase) No results found for: CRP, ESRSEDRATE, LATICACIDVEN     Note: Above Lab results reviewed.   Assessment  The primary encounter diagnosis was Failed back surgical syndrome. Diagnoses of Lumbar post-laminectomy syndrome, Chronic radicular lumbar pain (LEFT), Intractable neuropathic pain of left foot, Pain management contract signed, and Chronic pain syndrome were also pertinent to this visit.  Plan of Care   Ms. Jacqulene Huntley has a current medication list which includes the following long-term medication(s): albuterol, azelastine-fluticasone, calcium carbonate, citalopram, fluticasone, levothyroxine, omeprazole, and potassium.  Pharmacotherapy (Medications Ordered): Meds ordered this encounter  Medications  . buprenorphine (BUTRANS) 15 MCG/HR    Sig: Place 1 patch onto the skin every 7 (seven) days.    Dispense:  4 patch    Refill:  1    For  chronic pain syndrome  . traMADol (ULTRAM) 50 MG tablet    Sig: Take 1 tablet (50 mg total) by mouth 2 (two) times daily as needed.    Dispense:  60 tablet    Refill:  1  Follow-up plan:   Return in about 8 weeks (around 05/16/2020) for Medication Management, virtual.    Recent Visits Date Type Provider Dept  02/22/20 Telemedicine Gillis Santa, MD [facility]  01/31/20 Telemedicine Gillis Santa, MD [facility]  01/03/20 Office Visit Gillis Santa, MD [facility]  Showing recent visits within past 90 days and meeting all other requirements Today's Visits Date Type Provider Dept  03/21/20 Telemedicine Gillis Santa, MD [facility]  Showing today's visits and meeting all other requirements Future Appointments No visits were found meeting these conditions. Showing future appointments within next 90 days and meeting all other requirements  I discussed the assessment and treatment plan with the patient. The patient was provided an opportunity to ask questions and all were answered. The patient agreed with the plan and demonstrated an understanding of the instructions.  Patient advised to call back or seek an in-person evaluation if the symptoms or condition worsens.  Duration of encounter: 30 minutes.  Note by: Gillis Santa, MD Date: 03/21/2020; Time: 2:46 PM

## 2020-03-23 ENCOUNTER — Telehealth: Payer: Self-pay

## 2020-03-23 NOTE — Telephone Encounter (Signed)
Pt states she is out of her Pain strips and Tramadol needs a refill before her 05/16/20 appt

## 2020-03-24 NOTE — Telephone Encounter (Signed)
Attempted to call patient to inform her that she had prescriptions for Butrans patch and Tramadol that could have been picked up on 03-21-2020.  She has refills on both of these so they should last her until her next appointment.  Left message.

## 2020-05-11 ENCOUNTER — Encounter: Payer: Self-pay | Admitting: Psychiatry

## 2020-05-11 ENCOUNTER — Telehealth (INDEPENDENT_AMBULATORY_CARE_PROVIDER_SITE_OTHER): Payer: Medicare Other | Admitting: Psychiatry

## 2020-05-11 ENCOUNTER — Other Ambulatory Visit: Payer: Self-pay

## 2020-05-11 DIAGNOSIS — F411 Generalized anxiety disorder: Secondary | ICD-10-CM

## 2020-05-11 DIAGNOSIS — F3342 Major depressive disorder, recurrent, in full remission: Secondary | ICD-10-CM | POA: Diagnosis not present

## 2020-05-11 DIAGNOSIS — G4701 Insomnia due to medical condition: Secondary | ICD-10-CM

## 2020-05-11 NOTE — Progress Notes (Signed)
Virtual Visit via Telephone Note  I connected with Julie Mckinney on 05/11/20 at  2:00 PM EDT by telephone and verified that I am speaking with the correct person using two identifiers.  Location Provider Location : [facility] Patient Location : Home  Participants: Patient , Provider   I discussed the limitations, risks, security and privacy concerns of performing an evaluation and management service by telephone and the availability of in person appointments. I also discussed with the patient that there may be a patient responsible charge related to this service. The patient expressed understanding and agreed to proceed.   I discussed the assessment and treatment plan with the patient. The patient was provided an opportunity to ask questions and all were answered. The patient agreed with the plan and demonstrated an understanding of the instructions.   The patient was advised to call back or seek an in-person evaluation if the symptoms worsen or if the condition fails to improve as anticipated.   [place] MD OP Progress Note  05/11/2020 2:04 PM Julie Mckinney  MRN:  [id number]  Chief Complaint:  Chief Complaint    Follow-up; Depression     HPI: Julie Mckinney is a 86 year old Caucasian female, lives in a senior living community in [place], married, has a history of MDD, GAD, COPD, chronic pain, rheumatoid arthritis, history of breast cancer in remission, on tamoxifen, chronic pain was evaluated by telemedicine today.  Patient today reports she is overall doing well with regards to her mood.  She reports she continues to have chronic pain especially of her lower back which radiates to the legs.  She reports she rates her pain at an 8 out of 10.  She continues to take Butrans patch once a week.  She is also on tramadol.  Dr. Holley Raring is managing her pain.  She reports she is trying to live with her pain even though the pain has not improved much.  She reports good support system from her  husband.  Patient reports sleep is overall okay.  She is compliant on the BuSpar and Celexa.  Denies side effects.  Patient denies any suicidality, homicidality or perceptual disturbances.  Patient denies any other concerns today.  Visit Diagnosis:    ICD-10-CM   1. MDD (major depressive disorder), recurrent, in full remission ([place])  F33.42   2. GAD (generalized anxiety disorder)  F41.1   3. Insomnia due to medical condition  G47.01     Past Psychiatric History: I have reviewed past psychiatric history from progress note on 10/23/2017.  Past trials of Celexa, mirtazapine, melatonin  Past Medical History:  Past Medical History:  Diagnosis Date  . Anxiety   . Asthma   . Breast cancer ([place]) Right  . COPD (chronic obstructive pulmonary disease) ([place])   . Depression   . Personal history of radiation therapy     Past Surgical History:  Procedure Laterality Date  . ABDOMINAL HYSTERECTOMY    . APPENDECTOMY    . BACK SURGERY    . BREAST BIOPSY Right 2015   +  . BREAST EXCISIONAL BIOPSY Right 1975   neg  . BREAST LUMPECTOMY Right 2015   invasive lobular carcinoma  . CHOLECYSTECTOMY    . COLONOSCOPY     Scheduled for July 2018  . COLONOSCOPY WITH PROPOFOL N/A 08/14/2016   Procedure: COLONOSCOPY WITH PROPOFOL;  Surgeon: Manya Silvas, MD;  Location: [facility] ENDOSCOPY;  Service: Endoscopy;  Laterality: N/A;  . ESOPHAGOGASTRODUODENOSCOPY (EGD) WITH PROPOFOL N/A 08/14/2016   Procedure: ESOPHAGOGASTRODUODENOSCOPY (  EGD) WITH PROPOFOL;  Surgeon: Manya Silvas, MD;  Location: [facility] ENDOSCOPY;  Service: Endoscopy;  Laterality: N/A;    Family Psychiatric History: I have reviewed family psychiatric history from progress note on 10/23/2017  Family History:  Family History  Problem Relation Age of Onset  . Cancer Father   . Breast cancer Neg Hx     Social History: Reviewed social history from progress note on 10/23/2017 Social History   Socioeconomic History  . Marital status:  Married    Spouse name: ronald  . Number of children: 2  . Years of education: Not on file  . Highest education level: Some college, no degree  Occupational History  . Not on file  Tobacco Use  . Smoking status: Former Smoker    Packs/day: 0.50    Years: 30.00    Pack years: 15.00    Types: Cigarettes    Quit date: 06/22/1995    Years since quitting: 24.9  . Smokeless tobacco: Never Used  Vaping Use  . Vaping Use: Never used  Substance and Sexual Activity  . Alcohol use: Yes    Alcohol/week: 1.0 standard drink    Types: 1 Glasses of wine per week    Comment: Occasional-rare  . Drug use: No  . Sexual activity: Not Currently  Other Topics Concern  . Not on file  Social History Narrative  . Not on file   Social Determinants of Health   Financial Resource Strain: Not on file  Food Insecurity: Not on file  Transportation Needs: Not on file  Physical Activity: Not on file  Stress: Not on file  Social Connections: Not on file    Allergies:  Allergies  Allergen Reactions  . Tape Rash    Metabolic Disorder Labs: No results found for: HGBA1C, MPG No results found for: PROLACTIN No results found for: CHOL, TRIG, HDL, CHOLHDL, VLDL, LDLCALC No results found for: TSH  Therapeutic Level Labs: No results found for: LITHIUM No results found for: VALPROATE No components found for:  CBMZ  Current Medications: Current Outpatient Medications  Medication Sig Dispense Refill  . albuterol (PROVENTIL HFA;VENTOLIN HFA) 108 (90 Base) MCG/ACT inhaler Inhale 2 puffs into the lungs every 6 (six) hours as needed.     Marland Kitchen amoxicillin (AMOXIL) 500 MG capsule Take 1,000 mg by mouth 2 (two) times daily.    Marland Kitchen atorvastatin (LIPITOR) 40 MG tablet TAKE ONE TABLET BY MOUTH EVERY EVENING    . Azelastine-Fluticasone 137-50 MCG/ACT SUSP Place 1 spray into the nose daily.     . buprenorphine (BUTRANS) 15 MCG/HR Place 1 patch onto the skin every 7 (seven) days. 4 patch 1  . busPIRone (BUSPAR) 7.5 MG  tablet Take 1 tablet (7.5 mg total) by mouth 2 (two) times daily. 180 tablet 1  . calcium carbonate (OS-CAL) 600 MG TABS tablet Take 600 mg by mouth 2 (two) times daily.    . citalopram (CELEXA) 20 MG tablet Take 1.5 tablets (30 mg total) by mouth daily. 135 tablet 1  . clobetasol cream (TEMOVATE) 0.05 % Apply topically.    . fluticasone (FLONASE) 50 MCG/ACT nasal spray Place 1 spray into the nose as needed.    . Fluticasone-Umeclidin-Vilant (TRELEGY ELLIPTA) 100-62.5-25 MCG/INH AEPB Inhale 1 puff into the lungs daily.    Marland Kitchen GLUCOSAMINE SULFATE PO Take 1 capsule by mouth 2 (two) times daily.    Marland Kitchen levothyroxine (SYNTHROID, LEVOTHROID) 50 MCG tablet Take 50 mcg by mouth daily.    . Multiple Vitamin (MULTI-VITAMINS)  TABS Take 1 tablet by mouth daily.    . Nebulizers MISC Inhale 1 Dose into the lungs as needed.     Marland Kitchen omeprazole (PRILOSEC) 20 MG capsule Take 20 mg by mouth daily.    Marland Kitchen PATADAY 0.2 % SOLN Apply 1 drop to eye as needed.     . Potassium 99 MG TABS Take 1 tablet by mouth daily.     . traMADol (ULTRAM) 50 MG tablet Take 1 tablet (50 mg total) by mouth 2 (two) times daily as needed. 60 tablet 1  . vitamin B-12 (CYANOCOBALAMIN) 1000 MCG tablet Take 1,000 mcg by mouth daily.     No current facility-administered medications for this visit.     Musculoskeletal: Strength & Muscle Tone: UTA Gait & Station: UTA Patient leans: N/A  Psychiatric Specialty Exam: Review of Systems  Musculoskeletal: Positive for back pain.  Psychiatric/Behavioral: The patient is nervous/anxious.   All other systems reviewed and are negative.   There were no vitals taken for this visit.There is no height or weight on file to calculate BMI.  General Appearance: UTA  Eye Contact:  UTA  Speech:  Clear and Coherent  Volume:  Normal  Mood:  Anxious  Affect:  UTA  Thought Process:  Goal Directed and Descriptions of Associations: Intact  Orientation:  Full (Time, Place, and Person)  Thought Content: Logical    Suicidal Thoughts:  No  Homicidal Thoughts:  No  Memory:  Immediate;   Fair Recent;   Fair Remote;   Fair  Judgement:  Fair  Insight:  Fair  Psychomotor Activity:  UTA  Concentration:  Concentration: Fair and Attention Span: Fair  Recall:  [employer] of Knowledge: Fair  Language: Fair  Akathisia:  No  Handed:  Right  AIMS (if indicated): UTA  Assets:  Communication Skills Desire for Improvement Housing Intimacy Social Support  ADL's:  Intact  Cognition: WNL  Sleep:  Fair   Screenings: GAD-7   Flowsheet Row Video Visit from 05/11/2020 in [facility]  Total GAD-7 Score 4    PHQ2-9   Flowsheet Row Video Visit from 05/11/2020 in [facility] Office Visit from 12/06/2019 in [facility]  PHQ-2 Total Score 0 0       Assessment and Plan: Julie Mckinney is a 86 year old female, married, lives in an independent senior living community in [place], has a history of MDD, GAD, sleep problems, chronic pain, history of spinal fracture, COPD, breast cancer in remission was evaluated by telemedicine today.  Patient is currently stable with regards to her anxiety and depression.  Plan as noted below.  Plan MDD in full remission Celexa 30 mg p.o. daily  GAD-stable BuSpar 7.5 mg p.o. twice daily Celexa 30 mg p.o. daily Hydroxyzine 25-50 mg p.o. daily as needed  Insomnia-stable Will monitor closely  Patient will continue to benefit from sufficient pain management.  She is aware about drug to drug interaction with SSRIs and tramadol including serotonin syndrome.  Follow-up in clinic in 3 months or sooner if needed.   I have spent at least 15 minutes non face to face with patient today .  This note was generated in part or whole with voice recognition software. Voice recognition is usually quite accurate but there are transcription errors that can and very often do occur. I  apologize for any typographical errors that were not detected and corrected.          Ursula Alert, MD  05/12/2020, 8:20 AM

## 2020-05-15 ENCOUNTER — Telehealth: Payer: Self-pay

## 2020-05-15 ENCOUNTER — Encounter: Payer: Self-pay | Admitting: Student in an Organized Health Care Education/Training Program

## 2020-05-15 NOTE — Telephone Encounter (Signed)
LM for patient to call office for pre virtual appointment questions.  

## 2020-05-16 ENCOUNTER — Encounter: Payer: Self-pay | Admitting: Student in an Organized Health Care Education/Training Program

## 2020-05-16 ENCOUNTER — Ambulatory Visit
Payer: Medicare Other | Attending: Student in an Organized Health Care Education/Training Program | Admitting: Student in an Organized Health Care Education/Training Program

## 2020-05-16 ENCOUNTER — Other Ambulatory Visit: Payer: Self-pay

## 2020-05-16 DIAGNOSIS — M792 Neuralgia and neuritis, unspecified: Secondary | ICD-10-CM | POA: Diagnosis not present

## 2020-05-16 DIAGNOSIS — M961 Postlaminectomy syndrome, not elsewhere classified: Secondary | ICD-10-CM

## 2020-05-16 DIAGNOSIS — G894 Chronic pain syndrome: Secondary | ICD-10-CM | POA: Diagnosis not present

## 2020-05-16 MED ORDER — BUPRENORPHINE 15 MCG/HR TD PTWK: 1.0000 | MEDICATED_PATCH | TRANSDERMAL | 0 refills | Status: DC

## 2020-05-16 MED ORDER — TRAMADOL HCL 50 MG PO TABS: 50.0000 mg | ORAL_TABLET | Freq: Two times a day (BID) | ORAL | 0 refills | Status: DC | PRN

## 2020-05-16 NOTE — Progress Notes (Signed)
Patient: Julie Mckinney  Service Category: E/M  Provider: Gillis Santa, MD  DOB: 10/09/1934  DOS: 05/16/2020  Location: Office  MRN: [id number]  Setting: Ambulatory outpatient  Referring Provider: Dion Body, MD  Type: Established Patient  Specialty: Interventional Pain Management  PCP: Dion Body, MD  Location: Home  Delivery: TeleHealth     Virtual Encounter - Pain Management PROVIDER NOTE: Information contained herein reflects review and annotations entered in association with encounter. Interpretation of such information and data should be left to medically-trained personnel. Information provided to patient can be located elsewhere in the medical record under "Patient Instructions". Document created using STT-dictation technology, any transcriptional errors that may result from process are unintentional.    Contact & Pharmacy Preferred: [phone] Home: [phone] (home) Mobile: There is no such number on file (mobile). E-mail: [email or web address]  [facility] [place], [place] - [place] [address] [place] [address] Phone: [phone] Fax: [phone]   Pre-screening  [name] offered "in-person" vs "virtual" encounter. She indicated preferring virtual for this encounter.   Reason COVID-19*  Social distancing based on CDC and AMA recommendations.   [name] on 05/16/2020 via video conference.      I clearly identified myself as Gillis Santa, MD. I verified that I was speaking with the correct person using two identifiers (Name: Julie Mckinney, and date of birth: 1934/03/03).  Consent I sought verbal advanced consent from Julie Mckinney for virtual visit interactions. I informed Julie Mckinney of possible security and privacy concerns, risks, and limitations associated with providing "not-in-person" medical evaluation and management services. I also informed Julie Mckinney of the availability of "in-person"  appointments. Finally, I informed her that there would be a charge for the virtual visit and that she could be  personally, fully or partially, financially responsible for it. Julie Mckinney expressed understanding and agreed to proceed.   Historic Elements   [name] is a 86 y.o. year old, female patient evaluated today after our last contact on 01/03/2020. Ms. Inch  has a past medical history of Anxiety, Asthma, Breast cancer ([place]) (Right), COPD (chronic obstructive pulmonary disease) ([place]), Depression, and Personal history of radiation therapy. She also  has a past surgical history that includes Appendectomy; Colonoscopy; Cholecystectomy; Abdominal hysterectomy; Esophagogastroduodenoscopy (egd) with propofol (N/A, 08/14/2016); Colonoscopy with propofol (N/A, 08/14/2016); Back surgery; Breast biopsy (Right, 2015); Breast excisional biopsy (Right, 1975); and Breast lumpectomy (Right, 2015). [name] has a current medication list which includes the following prescription(s): albuterol, atorvastatin, azelastine-fluticasone, buspirone, calcium carbonate, citalopram, clobetasol cream, fluticasone, trelegy ellipta, glucosamine sulfate, levothyroxine, multi-vitamins, nebulizers, omeprazole, pataday, potassium, vitamin b-12, buprenorphine, and tramadol. She  reports that she quit smoking about 24 years ago. Her smoking use included cigarettes. She has a 15.00 pack-year smoking history. She has never used smokeless tobacco. She reports current alcohol use of about 1.0 standard drink of alcohol per week. She reports that she does not use drugs. [name] is allergic to tape.   HPI  Today, she is being contacted for medication management.  Patient states that she is not getting as much pain relief on the Butrans patch as she once was.  This is after dose escalation from 5-7.5- 10-15.  I do not recommend any further dose escalation beyond this.  I will refill her Butrans patch and tramadol as below.   I instructed [name] to follow-up face-to-face in 3 to 4 weeks so that I may do a physical exam perhaps consider any other  palliative  interventions for her lumbar spine.  She has not had a fall since her last visit.  Denies constipation or nausea.  States that tramadol and Butrans do help but not as much as she would like as she still has significant limitations in performing ADLs.  We have considered her for a spinal cord stimulator trial in the past but given severe scoliosis and interlaminar arthrosis, patient is not a suitable candidate for percutaneous SCS trial.  Pharmacotherapy Assessment  Analgesic: Butrans patch 2mg/hr, continue tramadol 50 mg twice daily as needed    Monitoring: Julie Mckinney PMP: PDMP reviewed during this encounter.       Pharmacotherapy: No side-effects or adverse reactions reported. Compliance: No problems identified. Effectiveness: Clinically acceptable. Plan: Refer to "POC".  UDS: No results found for: SUMMARY  Laboratory Chemistry Profile   Renal Lab Results  Component Value Date   BUN 27 (H) 01/19/2019   CREATININE 0.56 01/19/2019   GFRAA >60 01/19/2019   GFRNONAA >60 01/19/2019     Hepatic Lab Results  Component Value Date   AST 20 01/17/2019   ALT 18 01/17/2019   ALBUMIN 3.5 01/17/2019   ALKPHOS 34 (L) 01/17/2019     Electrolytes Lab Results  Component Value Date   NA 142 01/19/2019   K 4.0 01/19/2019   CL 111 01/19/2019   CALCIUM 8.5 (L) 01/19/2019     Bone No results found for: VD25OH, VD125OH2TOT, VYY4825OI3 VBC4888BV6 25OHVITD1, 25OHVITD2, 25OHVITD3, TESTOFREE, TESTOSTERONE   Inflammation (CRP: Acute Phase) (ESR: Chronic Phase) No results found for: CRP, ESRSEDRATE, LATICACIDVEN     Note: Above Lab results reviewed.   Assessment  Diagnoses of Failed back surgical syndrome, Lumbar post-laminectomy syndrome, Intractable neuropathic pain of left foot, and Chronic pain syndrome were pertinent to this visit.  Plan of Care   Problem-specific:  No problem-specific Assessment & Plan notes found for this encounter.  Ms. DSuriah Mckinney a current medication list which includes the following long-term medication(s): albuterol, azelastine-fluticasone, calcium carbonate, citalopram, fluticasone, levothyroxine, omeprazole, and potassium.  Pharmacotherapy (Medications Ordered): Meds ordered this encounter  Medications  . traMADol (ULTRAM) 50 MG tablet    Sig: Take 1 tablet (50 mg total) by mouth 2 (two) times daily as needed.    Dispense:  60 tablet    Refill:  0  . buprenorphine (BUTRANS) 15 MCG/HR    Sig: Place 1 patch onto the skin every 7 (seven) days for 28 days.    Dispense:  4 patch    Refill:  0    For chronic pain syndrome    Follow-up plan:   Return in about 22 days (around 06/07/2020) for Medication Management, in person.   Recent Visits Date Type Provider Dept  03/21/20 Telemedicine LGillis Santa MD [facility]  02/22/20 Telemedicine LGillis Santa MD [facility]  Showing recent visits within past 90 days and meeting all other requirements Today's Visits Date Type Provider Dept  05/16/20 Telemedicine LGillis Santa MD [facility]  Showing today's visits and meeting all other requirements Future Appointments No visits were found meeting these conditions. Showing future appointments within next 90 days and meeting all other requirements  I discussed the assessment and treatment plan with the patient. The patient was provided an opportunity to ask questions and all were answered. The patient agreed with the plan and demonstrated an understanding of the instructions.  Patient advised to call back or seek an in-person evaluation if the symptoms or condition worsens.  Duration of  encounter: 30 minutes.  Note by: Gillis Santa, MD Date: 05/16/2020; Time: 2:56 PM

## 2020-06-05 ENCOUNTER — Ambulatory Visit
Payer: Medicare Other | Attending: Student in an Organized Health Care Education/Training Program | Admitting: Student in an Organized Health Care Education/Training Program

## 2020-06-05 ENCOUNTER — Encounter: Payer: Self-pay | Admitting: Student in an Organized Health Care Education/Training Program

## 2020-06-05 ENCOUNTER — Other Ambulatory Visit: Payer: Self-pay

## 2020-06-05 VITALS — BP 116/84 | HR 69 | Temp 97.7°F | Resp 16 | Ht 60.0 in | Wt 103.0 lb

## 2020-06-05 DIAGNOSIS — G894 Chronic pain syndrome: Secondary | ICD-10-CM | POA: Insufficient documentation

## 2020-06-05 DIAGNOSIS — M47818 Spondylosis without myelopathy or radiculopathy, sacral and sacrococcygeal region: Secondary | ICD-10-CM | POA: Diagnosis present

## 2020-06-05 DIAGNOSIS — G5702 Lesion of sciatic nerve, left lower limb: Secondary | ICD-10-CM | POA: Diagnosis present

## 2020-06-05 DIAGNOSIS — M961 Postlaminectomy syndrome, not elsewhere classified: Secondary | ICD-10-CM | POA: Insufficient documentation

## 2020-06-05 DIAGNOSIS — M792 Neuralgia and neuritis, unspecified: Secondary | ICD-10-CM | POA: Diagnosis present

## 2020-06-05 MED ORDER — TRAMADOL HCL 50 MG PO TABS: 50.0000 mg | ORAL_TABLET | Freq: Three times a day (TID) | ORAL | 2 refills | Status: AC | PRN

## 2020-06-05 MED ORDER — BUPRENORPHINE 20 MCG/HR TD PTWK: 1.0000 | MEDICATED_PATCH | TRANSDERMAL | 0 refills | Status: AC

## 2020-06-05 NOTE — Progress Notes (Signed)
Nursing Pain Medication Assessment:  Safety precautions to be maintained throughout the outpatient stay will include: orient to surroundings, keep bed in low position, maintain call bell within reach at all times, provide assistance with transfer out of bed and ambulation.  Medication Inspection Compliance: Julie Mckinney did not comply with our request to bring her pills to be counted. She was reminded that bringing the medication bottles, even when empty, is a requirement.  Medication: None brought in. Pill/Patch Count: None available to be counted. Bottle Appearance: No container available. Did not bring bottle(s) to appointment. Filled Date: N/A Last Medication intake:  wearing patch today buprenorphine 15 mcg/h and Tramadol 50 mg, last dose today.

## 2020-06-05 NOTE — Patient Instructions (Signed)
____________________________________________________________________________________________  General Risks and Possible Complications  Patient Responsibilities: It is important that you read this as it is part of your informed consent. It is our duty to inform you of the risks and possible complications associated with treatments offered to you. It is your responsibility as a patient to read this and to ask questions about anything that is not clear or that you believe was not covered in this document.  Patient's Rights: You have the right to refuse treatment. You also have the right to change your mind, even after initially having agreed to have the treatment done. However, under this last option, if you wait until the last second to change your mind, you may be charged for the materials used up to that point.  Introduction: Medicine is not an exact science. Everything in Medicine, including the lack of treatment(s), carries the potential for danger, harm, or loss (which is by definition: Risk). In Medicine, a complication is a secondary problem, condition, or disease that can aggravate an already existing one. All treatments carry the risk of possible complications. The fact that a side effects or complications occurs, does not imply that the treatment was conducted incorrectly. It must be clearly understood that these can happen even when everything is done following the highest safety standards.  No treatment: You can choose not to proceed with the proposed treatment alternative. The "PRO(s)" would include: avoiding the risk of complications associated with the therapy. The "CON(s)" would include: not getting any of the treatment benefits. These benefits fall under one of three categories: diagnostic; therapeutic; and/or palliative. Diagnostic benefits include: getting information which can ultimately lead to improvement of the disease or symptom(s). Therapeutic benefits are those associated with the  successful treatment of the disease. Finally, palliative benefits are those related to the decrease of the primary symptoms, without necessarily curing the condition (example: decreasing the pain from a flare-up of a chronic condition, such as incurable terminal cancer).  General Risks and Complications: These are associated to most interventional treatments. They can occur alone, or in combination. They fall under one of the following six (6) categories: no benefit or worsening of symptoms; bleeding; infection; nerve damage; allergic reactions; and/or death. 1. No benefits or worsening of symptoms: In Medicine there are no guarantees, only probabilities. No healthcare provider can ever guarantee that a medical treatment will work, they can only state the probability that it may. Furthermore, there is always the possibility that the condition may worsen, either directly, or indirectly, as a consequence of the treatment. 2. Bleeding: This is more common if the patient is taking a blood thinner, either prescription or over the counter (example: Goody Powders, Fish oil, Aspirin, Garlic, etc.), or if suffering a condition associated with impaired coagulation (example: Hemophilia, cirrhosis of the liver, low platelet counts, etc.). However, even if you do not have one on these, it can still happen. If you have any of these conditions, or take one of these drugs, make sure to notify your treating physician. 3. Infection: This is more common in patients with a compromised immune system, either due to disease (example: diabetes, cancer, human immunodeficiency virus [HIV], etc.), or due to medications or treatments (example: therapies used to treat cancer and rheumatological diseases). However, even if you do not have one on these, it can still happen. If you have any of these conditions, or take one of these drugs, make sure to notify your treating physician. 4. Nerve Damage: This is more common when the   treatment is  an invasive one, but it can also happen with the use of medications, such as those used in the treatment of cancer. The damage can occur to small secondary nerves, or to large primary ones, such as those in the spinal cord and brain. This damage may be temporary or permanent and it may lead to impairments that can range from temporary numbness to permanent paralysis and/or brain death. 5. Allergic Reactions: Any time a substance or material comes in contact with our body, there is the possibility of an allergic reaction. These can range from a mild skin rash (contact dermatitis) to a severe systemic reaction (anaphylactic reaction), which can result in death. 6. Death: In general, any medical intervention can result in death, most of the time due to an unforeseen complication. ____________________________________________________________________________________________  ____________________________________________________________________________________________  Preparing for your procedure (without sedation)  Procedure appointments are limited to planned procedures: . No Prescription Refills. . No disability issues will be discussed. . No medication changes will be discussed.  Instructions: . Oral Intake: Do not eat or drink anything for at least 6 hours prior to your procedure. (Exception: Blood Pressure Medication. See below.) . Transportation: Unless otherwise stated by your physician, you may drive yourself after the procedure. . Blood Pressure Medicine: Do not forget to take your blood pressure medicine with a sip of water the morning of the procedure. If your Diastolic (lower reading)is above 100 mmHg, elective cases will be cancelled/rescheduled. . Blood thinners: These will need to be stopped for procedures. Notify our staff if you are taking any blood thinners. Depending on which one you take, there will be specific instructions on how and when to stop it. . Diabetics on insulin: Notify  the staff so that you can be scheduled 1st case in the morning. If your diabetes requires high dose insulin, take only  of your normal insulin dose the morning of the procedure and notify the staff that you have done so. . Preventing infections: Shower with an antibacterial soap the morning of your procedure.  . Build-up your immune system: Take 1000 mg of Vitamin C with every meal (3 times a day) the day prior to your procedure. . Antibiotics: Inform the staff if you have a condition or reason that requires you to take antibiotics before dental procedures. . Pregnancy: If you are pregnant, call and cancel the procedure. . Sickness: If you have a cold, fever, or any active infections, call and cancel the procedure. . Arrival: You must be in the facility at least 30 minutes prior to your scheduled procedure. . Children: Do not bring any children with you. . Dress appropriately: Bring dark clothing that you would not mind if they get stained. . Valuables: Do not bring any jewelry or valuables.  Reasons to call and reschedule or cancel your procedure: (Following these recommendations will minimize the risk of a serious complication.) . Surgeries: Avoid having procedures within 2 weeks of any surgery. (Avoid for 2 weeks before or after any surgery). . Flu Shots: Avoid having procedures within 2 weeks of a flu shots or . (Avoid for 2 weeks before or after immunizations). . Barium: Avoid having a procedure within 7-10 days after having had a radiological study involving the use of radiological contrast. (Myelograms, Barium swallow or enema study). . Heart attacks: Avoid any elective procedures or surgeries for the initial 6 months after a "Myocardial Infarction" (Heart Attack). . Blood thinners: It is imperative that you stop these medications before procedures. Let us   know if you if you take any blood thinner.  . Infection: Avoid procedures during or within two weeks of an infection (including chest  colds or gastrointestinal problems). Symptoms associated with infections include: Localized redness, fever, chills, night sweats or profuse sweating, burning sensation when voiding, cough, congestion, stuffiness, runny nose, sore throat, diarrhea, nausea, vomiting, cold or Flu symptoms, recent or current infections. It is specially important if the infection is over the area that we intend to treat. Marland Kitchen Heart and lung problems: Symptoms that may suggest an active cardiopulmonary problem include: cough, chest pain, breathing difficulties or shortness of breath, dizziness, ankle swelling, uncontrolled high or unusually low blood pressure, and/or palpitations. If you are experiencing any of these symptoms, cancel your procedure and contact your primary care physician for an evaluation.  Remember:  Regular Business hours are:  Monday to Thursday 8:00 AM to 4:00 PM  Provider's Schedule: Milinda Pointer, MD:  Procedure days: Tuesday and Thursday 7:30 AM to 4:00 PM  Gillis Santa, MD:  Procedure days: Monday and Wednesday 7:30 AM to 4:00 PM ____________________________________________________________________________________________  Pyriformis Muscle Injection Patient Information  Description:  The pyriformis muscle lies deep in the buttocks and hip area.  It is situated close to the sciatic nerve, which affects sensations in the leg.  For various reasons this muscle can develop a condition of tightness and spasm that can result in pain in the hip and leg.  Injection of local anesthetics (numbing medicines) into the muscle can provide diagnostic information as to the causes of pain.  Some injections will also include a steroid to reduce inflammation.   After numbing the skin with local anesthetic (like Novocaine), a small needle is passed into buttock region near the sciatic nerve.  You may experience an electrical sensation down the leg while this is being done.  We also may ask if we are reproducing  your normal pain during the injection.  The entire block usually lasts less than 3 minutes.  Conditions which may be treated by pyriformis block:   Buttock, hip, or leg pain  Preparation for the injection:  1. Do not eat any solid food or dairy products within 8 hours of your appointment. 2. You may drink clear liquids up to 3 hours before appointment.  Clear liquids include water, black coffee, juice or soda.  No milk or cream please. 3. You may take your regular medication, including pain medications, with a sip or water before your appointment.  Diabetics should hold regular insulin (if taken separately) and take 1/2 normal NPH dose the morning of the procedure. Carry some sugar containing items with you to your appointment. 4. A driver must accompany you and be prepared to drive you home after your procedure. 5. Bring all your current medications with you. 6. An IV may be inserted and sedation may be given at the discretion of the physician. 7. A blood pressure cuff, EKG, and other monitors will be applied during the procedure. Some patients may need to have extra oxygen administered for a short period.  8. You will be asked to provide medical information, including your allergies, prior to the procedure.  We must know immediately if you are taking blood thinners (like Coumadin/Warfarin)  or if you allergic to IV iodine contrast (dye). We must know if you could possibly be pregnant.  Possible side-effects:   Bleeding from needle site  Infection (rare, may require surgery)  Nerve injury (rare)  Numbness & tingling (temporary)  A brief convulsion or  seizure  Light-headedness (temporary)  Pain at injection site (several days)  Decreased blood pressure (temporary)  Weakness in the leg (temporary)  Call if you experience:  New onset weakness or numbness of an extremity below the injection site that last more than 8 hours  Hives or difficulty breathing ( go to the emergency  room)  Inflammation or drainage at the injection site.  Any new symptoms which are concerning to you  Please note:  Although the local anesthetic injected can often make your hip/buttock/leg feel good for several hours after the injection, the pain will likely return.  It takes 3-7 days for steroids to work in the pyriformis area.  You may not notice any pain relief for at least that one week.  If effective, we will often do a series of three injections spaced 3-6 weeks apart to maximally decrease your pain.  After the initial series, we generally will wait several months before considering a repeat injection of the same type.  If you have any questions, please call [phone] [facility]

## 2020-06-05 NOTE — Progress Notes (Signed)
PROVIDER NOTE: Information contained herein reflects review and annotations entered in association with encounter. Interpretation of such information and data should be left to medically-trained personnel. Information provided to patient can be located elsewhere in the medical record under "Patient Instructions". Document created using STT-dictation technology, any transcriptional errors that may result from process are unintentional.    Patient: Julie Mckinney  Service Category: E/M  Provider: Gillis Santa, MD  DOB: 1934-10-10  DOS: 06/05/2020  Specialty: Interventional Pain Management  MRN: [id number]  Setting: Ambulatory outpatient  PCP: Dion Body, MD  Type: Established Patient    Referring Provider: Dion Body, MD  Location: Office  Delivery: Face-to-face     HPI  Ms. Infantof Villagomez, a 86 y.o. year old female, is here today because of her SI joint arthritis [M47.818]. Ms. Pfenning's primary complain today is Back Pain (Lumbar left ) Last encounter: My last encounter with her was on 01/03/2020. Pertinent problems: Ms. Matar has History of CVA (cerebrovascular accident) without residual deficits; MDD (major depressive disorder), recurrent episode, mild ([place]); T10 vertebral fracture ([place]); Closed stable burst fracture of T12 vertebra ([place]); Lumbar post-laminectomy syndrome; MDD (major depressive disorder), recurrent, in full remission ([place]); Pain management contract signed; and Chronic pain syndrome on their pertinent problem list. Pain Assessment: Severity of Chronic pain is reported as a 9 /10. Location: Back Lower,Left/into left hip and into the buttocks. Onset: More than a month ago. Quality: Discomfort,Constant. Timing: Constant. Modifying factor(s): rest, lying flat. Vitals:  height is 5' (1.524 m) and weight is 103 lb (46.7 kg). Her temporal temperature is 97.7 F (36.5 C). Her blood pressure is 116/84 and her pulse is 69. Her respiration is 16 and oxygen saturation is  97%.   Reason for encounter: medication management.  And increased left piriformis, left SI joint, left buttock pain.  Patient presents today for medication management.  She endorses mild to moderate pain relief with Butrans patch at 15 mcg an hour.  She is requesting to go up on the dose as she is still experiencing suboptimal pain relief and states that her baseline pain is fairly elevated on most days.  We have increased from 5-7.5 to 10-15.  I counseled the patient on this discussing risks and benefits.  She is not having any side effects with this medication so I think it is reasonable to increase to 20 mcg an hour.  Patient is also having increased breakthrough pain as well and we discussed increasing her tramadol to 50 mg 3 times daily as needed.  Given increased left buttock pain related to SI joint dysfunction, left piriformis syndrome, recommend left sacroiliac joint injection as well as left piriformis trigger point injection.  Risks and benefits reviewed and patient would like to proceed.  Pharmacotherapy Assessment   Analgesic: Butrans patch 17mg/hr, continue tramadol 50 mg every 8 hours as needed    Monitoring: [place] PMP: PDMP reviewed during this encounter.       Pharmacotherapy: No side-effects or adverse reactions reported. Compliance: No problems identified. Effectiveness: Clinically acceptable.  PJanett Billow RN  06/05/2020  2:06 PM  Sign when Signing Visit Nursing Pain Medication Assessment:  Safety precautions to be maintained throughout the outpatient stay will include: orient to surroundings, keep bed in low position, maintain call bell within reach at all times, provide assistance with transfer out of bed and ambulation.  Medication Inspection Compliance: Ms. RAlberdid not comply with our request to bring her pills to be counted. She was reminded that bringing the  medication bottles, even when empty, is a requirement.  Medication: None brought in. Pill/Patch  Count: None available to be counted. Bottle Appearance: No container available. Did not bring bottle(s) to appointment. Filled Date: N/A Last Medication intake:  wearing patch today buprenorphine 15 mcg/h and Tramadol 50 mg, last dose today.     UDS: No results found for: SUMMARY   ROS  Constitutional: Denies any fever or chills Gastrointestinal: No reported hemesis, hematochezia, vomiting, or acute GI distress Musculoskeletal: Left low back, buttock, left piriformis, left SI joint pain Neurological: No reported episodes of acute onset apraxia, aphasia, dysarthria, agnosia, amnesia, paralysis, loss of coordination, or loss of consciousness  Medication Review  Azelastine-Fluticasone, Fluticasone-Umeclidin-Vilant, Glucosamine Sulfate, Multi-Vitamins, Nebulizers, Olopatadine HCl, Potassium, albuterol, atorvastatin, buprenorphine, busPIRone, calcium carbonate, citalopram, clobetasol cream, fluticasone, levothyroxine, omeprazole, traMADol, and vitamin B-12  History Review  Allergy: Ms. Eichler is allergic to tape. Drug: Ms. Doyel  reports no history of drug use. Alcohol:  reports current alcohol use of about 1.0 standard drink of alcohol per week. Tobacco:  reports that she quit smoking about 24 years ago. Her smoking use included cigarettes. She has a 15.00 pack-year smoking history. She has never used smokeless tobacco. Social: Ms. Kohls  reports that she quit smoking about 24 years ago. Her smoking use included cigarettes. She has a 15.00 pack-year smoking history. She has never used smokeless tobacco. She reports current alcohol use of about 1.0 standard drink of alcohol per week. She reports that she does not use drugs. Medical:  has a past medical history of Anxiety, Asthma, Breast cancer ([place]) (Right), COPD (chronic obstructive pulmonary disease) ([place]), Depression, and Personal history of radiation therapy. Surgical: Ms. Wrobel  has a past surgical history that includes  Appendectomy; Colonoscopy; Cholecystectomy; Abdominal hysterectomy; Esophagogastroduodenoscopy (egd) with propofol (N/A, 08/14/2016); Colonoscopy with propofol (N/A, 08/14/2016); Back surgery; Breast biopsy (Right, 2015); Breast excisional biopsy (Right, 1975); and Breast lumpectomy (Right, 2015). Family: family history includes Cancer in her father.  Laboratory Chemistry Profile   Renal Lab Results  Component Value Date   BUN 27 (H) 01/19/2019   CREATININE 0.56 01/19/2019   GFRAA >60 01/19/2019   GFRNONAA >60 01/19/2019     Hepatic Lab Results  Component Value Date   AST 20 01/17/2019   ALT 18 01/17/2019   ALBUMIN 3.5 01/17/2019   ALKPHOS 34 (L) 01/17/2019     Electrolytes Lab Results  Component Value Date   NA 142 01/19/2019   K 4.0 01/19/2019   CL 111 01/19/2019   CALCIUM 8.5 (L) 01/19/2019     Bone No results found for: VD25OH, VD125OH2TOT, [id number], WJ1914NW2, 25OHVITD1, 25OHVITD2, 25OHVITD3, TESTOFREE, TESTOSTERONE   Inflammation (CRP: Acute Phase) (ESR: Chronic Phase) No results found for: CRP, ESRSEDRATE, LATICACIDVEN     Note: Above Lab results reviewed.  Physical Exam  General appearance: Well nourished, well developed, and well hydrated. In no apparent acute distress Mental status: Alert, oriented x 3 (person, place, & time)       Respiratory: No evidence of acute respiratory distress Eyes: PERLA Vitals: BP 116/84 (BP Location: Left Arm, Patient Position: Sitting, Cuff Size: Normal)   Pulse 69   Temp 97.7 F (36.5 C) (Temporal)   Resp 16   Ht 5' (1.524 m)   Wt 103 lb (46.7 kg)   SpO2 97%   BMI 20.12 kg/m  BMI: Estimated body mass index is 20.12 kg/m as calculated from the following:   Height as of this encounter: 5' (1.524 m).  Weight as of this encounter: 103 lb (46.7 kg). Ideal: Ideal body weight: 45.5 kg (100 lb 4.9 oz) Adjusted ideal body weight: 46 kg (101 lb 6.2 oz)   Lumbar Spine Area Exam  Skin & Axial Inspection: Thoraco-lumbar  Scoliosis Alignment: Symmetrical Functional ROM: Pain restricted ROM       Stability: No instability detected Muscle Tone/Strength: Functionally intact. No obvious neuro-muscular anomalies detected. Sensory (Neurological): Musculoskeletal pain pattern Palpation: No palpable anomalies       Provocative Tests: Hyperextension/rotation test: Unable to perform       Lumbar quadrant test (Kemp's test): Unable to perform       Lateral bending test: deferred today       Patrick's Maneuver: (+) for left-sided S-I arthralgia             FABER* test: (+) for left-sided S-I arthralgia             S-I anterior distraction/compression test: (+) Left-sided S-I arthralgia/arthropathy S-I lateral compression test: (+)   S-I arthralgia/arthropathy left S-I Thigh-thrust test: (+)   S-I arthralgia/arthropathy, left S-I Gaenslen's test: deferred today         *(Flexion, ABduction and External Rotation) Gait & Posture Assessment  Ambulation: Limited Gait: Antalgic Posture: Difficulty standing up straight, due to pain  Lower Extremity Exam    Side: Right lower extremity  Side: Left lower extremity  Stability: No instability observed          Stability: No instability observed          Skin & Extremity Inspection: Skin color, temperature, and hair growth are WNL. No peripheral edema or cyanosis. No masses, redness, swelling, asymmetry, or associated skin lesions. No contractures.  Skin & Extremity Inspection: Skin color, temperature, and hair growth are WNL. No peripheral edema or cyanosis. No masses, redness, swelling, asymmetry, or associated skin lesions. No contractures.  Functional ROM: Unrestricted ROM                  Functional ROM: Pain restricted ROM for hip and knee joints          Muscle Tone/Strength: Functionally intact. No obvious neuro-muscular anomalies detected.  Muscle Tone/Strength: Functionally intact. No obvious neuro-muscular anomalies detected.  Sensory (Neurological): Unimpaired         Sensory (Neurological): Musculoskeletal pain pattern        DTR: Patellar: deferred today Achilles: deferred today Plantar: deferred today  DTR: Patellar: deferred today Achilles: deferred today Plantar: deferred today  Palpation: No palpable anomalies  Palpation: No palpable anomalies    Assessment   Status Diagnosis  Having a Flare-up Having a Flare-up Having a Flare-up 1. SI joint arthritis (left)   2. Piriformis syndrome, left   3. Intractable neuropathic pain of left foot   4. Lumbar post-laminectomy syndrome   5. Failed back surgical syndrome   6. Chronic pain syndrome      Updated Problems: Problem  Lumbar Post-Laminectomy Syndrome  SI joint arthritis (left)  Piriformis Syndrome, Left    Plan of Care  Ms. Cherilynn Schomburg has a current medication list which includes the following long-term medication(s): albuterol, azelastine-fluticasone, calcium carbonate, citalopram, fluticasone, levothyroxine, omeprazole, and potassium.  1. SI joint arthritis (left) -Has failed conservative treatment with oral medications as well as PT, positive provocative maneuvers suggesting left SI joint dysfunction.  Recommend left sacroiliac joint injection - SACROILIAC JOINT INJECTION; Future  2. Piriformis syndrome, left -Trigger point injection of left piriformis under fluoroscopy - TRIGGER POINT INJECTION; Future  3. Intractable neuropathic pain of left foot -Increase Butrans patch to 20 mcg an hour, tramadol 50 mg 3 times daily as needed - buprenorphine (BUTRANS) 20 MCG/HR PTWK; Place 1 patch onto the skin every 7 (seven) days.  Dispense: 4 patch; Refill: 0 - traMADol (ULTRAM) 50 MG tablet; Take 1 tablet (50 mg total) by mouth every 8 (eight) hours as needed.  Dispense: 90 tablet; Refill: 2  4. Lumbar post-laminectomy syndrome -Not a candidate for spinal cord stimulator trial given advanced thoracolumbar scoliosis - buprenorphine (BUTRANS) 20 MCG/HR PTWK; Place 1 patch onto the  skin every 7 (seven) days.  Dispense: 4 patch; Refill: 0 - traMADol (ULTRAM) 50 MG tablet; Take 1 tablet (50 mg total) by mouth every 8 (eight) hours as needed.  Dispense: 90 tablet; Refill: 2  5. Failed back surgical syndrome - buprenorphine (BUTRANS) 20 MCG/HR PTWK; Place 1 patch onto the skin every 7 (seven) days.  Dispense: 4 patch; Refill: 0 - traMADol (ULTRAM) 50 MG tablet; Take 1 tablet (50 mg total) by mouth every 8 (eight) hours as needed.  Dispense: 90 tablet; Refill: 2  6. Chronic pain syndrome - buprenorphine (BUTRANS) 20 MCG/HR PTWK; Place 1 patch onto the skin every 7 (seven) days.  Dispense: 4 patch; Refill: 0 - traMADol (ULTRAM) 50 MG tablet; Take 1 tablet (50 mg total) by mouth every 8 (eight) hours as needed.  Dispense: 90 tablet; Refill: 2 - TRIGGER POINT INJECTION; Future  Pharmacotherapy (Medications Ordered): Meds ordered this encounter  Medications  . buprenorphine (BUTRANS) 20 MCG/HR PTWK    Sig: Place 1 patch onto the skin every 7 (seven) days.    Dispense:  4 patch    Refill:  0    For chronic pain syndrome  . traMADol (ULTRAM) 50 MG tablet    Sig: Take 1 tablet (50 mg total) by mouth every 8 (eight) hours as needed.    Dispense:  90 tablet    Refill:  2   Orders:  Orders Placed This Encounter  Procedures  . SACROILIAC JOINT INJECTION    Standing Status:   Future    Standing Expiration Date:   07/06/2020    Scheduling Instructions:     Side: LEFT     Sedation: Patient's choice.     Timeframe: ASAP    Order Specific Question:   Where will this procedure be performed?    Answer:   [facility]  . TRIGGER POINT INJECTION    Area: Buttocks region (gluteal area) Indications: Piriformis muscle pain;  left piriformis-syndrome; piriformis muscle spasms (P80.998). CPT code: 20552    Standing Status:   Future    Standing Expiration Date:   06/05/2021    Scheduling Instructions:     Type: Myoneural block (TPI) of piriformis muscle.     Side:  LEFT      Sedation: Patient's choice.     Timeframe: Today    Order Specific Question:   Where will this procedure be performed?    Answer:   [facility]   Follow-up plan:   Return in about 1 week (around 06/12/2020) for Left SI-J + Left Piriformis , without sedation.   Recent Visits Date Type Provider Dept  05/16/20 Telemedicine Gillis Santa, MD [facility]  03/21/20 Telemedicine Gillis Santa, MD [facility]  Showing recent visits within past 90 days and meeting all other requirements Today's Visits Date Type Provider Dept  06/05/20 Office Visit Gillis Santa, MD [facility]  Showing  today's visits and meeting all other requirements Future Appointments No visits were found meeting these conditions. Showing future appointments within next 90 days and meeting all other requirements  I discussed the assessment and treatment plan with the patient. The patient was provided an opportunity to ask questions and all were answered. The patient agreed with the plan and demonstrated an understanding of the instructions.  Patient advised to call back or seek an in-person evaluation if the symptoms or condition worsens.  Duration of encounter: 16mnutes.  Note by: BGillis Santa MD Date: 06/05/2020; Time: 2:43 PM

## 2020-06-12 ENCOUNTER — Telehealth: Payer: Self-pay | Admitting: Oncology

## 2020-06-12 ENCOUNTER — Inpatient Hospital Stay: Payer: Medicare Other | Admitting: Oncology

## 2020-06-12 NOTE — Telephone Encounter (Signed)
Patient left vm stating she is not feeling well and will not be at her md appointment today.  She states she will call back today or tomorrow to reschedule.

## 2020-06-26 ENCOUNTER — Ambulatory Visit: Payer: Medicare Other | Admitting: Student in an Organized Health Care Education/Training Program

## 2020-07-10 ENCOUNTER — Ambulatory Visit
Admission: RE | Admit: 2020-07-10 | Discharge: 2020-07-10 | Disposition: A | Discharge status: Home / Self Care | Payer: Medicare Other | Source: Ambulatory Visit | Attending: Student in an Organized Health Care Education/Training Program | Admitting: Student in an Organized Health Care Education/Training Program

## 2020-07-10 ENCOUNTER — Ambulatory Visit (HOSPITAL_BASED_OUTPATIENT_CLINIC_OR_DEPARTMENT_OTHER): Payer: Medicare Other | Admitting: Student in an Organized Health Care Education/Training Program

## 2020-07-10 ENCOUNTER — Encounter: Payer: Self-pay | Admitting: Student in an Organized Health Care Education/Training Program

## 2020-07-10 ENCOUNTER — Other Ambulatory Visit: Payer: Self-pay

## 2020-07-10 VITALS — BP 151/84 | HR 70 | Temp 97.1°F | Resp 21 | Ht 60.0 in | Wt 100.0 lb

## 2020-07-10 DIAGNOSIS — Z888 Allergy status to other drugs, medicaments and biological substances status: Secondary | ICD-10-CM | POA: Diagnosis not present

## 2020-07-10 DIAGNOSIS — M47818 Spondylosis without myelopathy or radiculopathy, sacral and sacrococcygeal region: Secondary | ICD-10-CM | POA: Diagnosis not present

## 2020-07-10 DIAGNOSIS — G894 Chronic pain syndrome: Secondary | ICD-10-CM | POA: Insufficient documentation

## 2020-07-10 DIAGNOSIS — G5702 Lesion of sciatic nerve, left lower limb: Secondary | ICD-10-CM | POA: Insufficient documentation

## 2020-07-10 DIAGNOSIS — M549 Dorsalgia, unspecified: Secondary | ICD-10-CM | POA: Insufficient documentation

## 2020-07-10 DIAGNOSIS — M461 Sacroiliitis, not elsewhere classified: Secondary | ICD-10-CM | POA: Diagnosis not present

## 2020-07-10 DIAGNOSIS — Z7952 Long term (current) use of systemic steroids: Secondary | ICD-10-CM | POA: Insufficient documentation

## 2020-07-10 MED ORDER — IOHEXOL 180 MG/ML  SOLN
10.0000 mL | Freq: Once | INTRAMUSCULAR | Status: AC
  Administered 2020-07-10: 10 mL via INTRA_ARTICULAR

## 2020-07-10 MED ORDER — ROPIVACAINE HCL 2 MG/ML IJ SOLN
INTRAMUSCULAR | Status: AC
  Filled 2020-07-10: qty 20

## 2020-07-10 MED ORDER — METHYLPREDNISOLONE ACETATE 40 MG/ML IJ SUSP
40.0000 mg | Freq: Once | INTRAMUSCULAR | Status: AC
  Administered 2020-07-10: 40 mg via INTRA_ARTICULAR
  Filled 2020-07-10: qty 1

## 2020-07-10 MED ORDER — ROPIVACAINE HCL 2 MG/ML IJ SOLN
9.0000 mL | Freq: Once | INTRAMUSCULAR | Status: AC
  Administered 2020-07-10: 9 mL via PERINEURAL

## 2020-07-10 MED ORDER — DEXAMETHASONE SODIUM PHOSPHATE 10 MG/ML IJ SOLN
10.0000 mg | Freq: Once | INTRAMUSCULAR | Status: AC
  Administered 2020-07-10: 10 mg
  Filled 2020-07-10: qty 1

## 2020-07-10 MED ORDER — LIDOCAINE HCL 2 % IJ SOLN
20.0000 mL | Freq: Once | INTRAMUSCULAR | Status: AC
  Administered 2020-07-10: 400 mg
  Filled 2020-07-10: qty 10

## 2020-07-10 NOTE — Progress Notes (Signed)
Safety precautions to be maintained throughout the outpatient stay will include: orient to surroundings, keep bed in low position, maintain call bell within reach at all times, provide assistance with transfer out of bed and ambulation.  

## 2020-07-10 NOTE — Patient Instructions (Signed)

## 2020-07-10 NOTE — Progress Notes (Signed)
PROVIDER NOTE: Information contained herein reflects review and annotations entered in association with encounter. Interpretation of such information and data should be left to medically-trained personnel. Information provided to patient can be located elsewhere in the medical record under "Patient Instructions". Document created using STT-dictation technology, any transcriptional errors that may result from process are unintentional.    Patient: Julie Mckinney  Service Category: Procedure  Provider: Gillis Santa, MD  DOB: July 09, 1934  DOS: 07/10/2020  Location: [facility]  MRN: [id number]  Setting: Ambulatory - outpatient  Referring Provider: Dion Body, MD  Type: Established Patient  Specialty: Interventional Pain Management  PCP: Dion Body, MD   Primary Reason for Visit: Interventional Pain Management Treatment. CC: Back Pain  Procedure:          Anesthesia, Analgesia, Anxiolysis:  Type: Diagnostic Sacroiliac Joint Steroid Injection        and left piriformis injection under fluoroscopy  Region: Inferior Lumbosacral Region Level: PIIS (Posterior Inferior Iliac Spine) Laterality: Left  Type: Local Anesthesia  Local Anesthetic: Lidocaine 1-2%  Position: Prone           Indications: 1. SI joint arthritis (left)   2. Piriformis syndrome, left   3. Chronic pain syndrome    Pain Score: Pre-procedure: 9 /10 Post-procedure: 7  (walking with walker)/10   Pre-op H&P Assessment:  Julie Mckinney is a 86 y.o. (year old), female patient, seen today for interventional treatment. She  has a past surgical history that includes Appendectomy; Colonoscopy; Cholecystectomy; Abdominal hysterectomy; Esophagogastroduodenoscopy (egd) with propofol (N/A, 08/14/2016); Colonoscopy with propofol (N/A, 08/14/2016); Back surgery; Breast biopsy (Right, 2015); Breast excisional biopsy (Right, 1975); and Breast lumpectomy (Right, 2015). Julie Mckinney has a current medication list which  includes the following prescription(s): albuterol, atorvastatin, azelastine-fluticasone, buspirone, calcium carbonate, citalopram, clobetasol cream, glucosamine sulfate, levothyroxine, multi-vitamins, nebulizers, omeprazole, pataday, potassium, tramadol, vitamin b-12, fluticasone, and trelegy ellipta. Her primarily concern today is the Back Pain  Initial Vital Signs:  Pulse/HCG Rate: 70  Temp: (!) 97.1 F (36.2 C) Resp: 18 BP: (!) 147/70 SpO2: 100 %  BMI: Estimated body mass index is 19.53 kg/m as calculated from the following:   Height as of this encounter: 5' (1.524 m).   Weight as of this encounter: 100 lb (45.4 kg).  Risk Assessment: Allergies: Reviewed. She is allergic to tape.  Allergy Precautions: None required Coagulopathies: Reviewed. None identified.  Blood-thinner therapy: None at this time Active Infection(s): Reviewed. None identified. Julie Mckinney is afebrile  Site Confirmation: Julie Mckinney was asked to confirm the procedure and laterality before marking the site Procedure checklist: Completed Consent: Before the procedure and under the influence of no sedative(s), amnesic(s), or anxiolytics, the patient was informed of the treatment options, risks and possible complications. To fulfill our ethical and legal obligations, as recommended by the American Medical Association's Code of Ethics, I have informed the patient of my clinical impression; the nature and purpose of the treatment or procedure; the risks, benefits, and possible complications of the intervention; the alternatives, including doing nothing; the risk(s) and benefit(s) of the alternative treatment(s) or procedure(s); and the risk(s) and benefit(s) of doing nothing. The patient was provided information about the general risks and possible complications associated with the procedure. These may include, but are not limited to: failure to achieve desired goals, infection, bleeding, organ or nerve damage, allergic  reactions, paralysis, and death. In addition, the patient was informed of those risks and complications associated to the procedure, such as failure to decrease pain;  infection; bleeding; organ or nerve damage with subsequent damage to sensory, motor, and/or autonomic systems, resulting in permanent pain, numbness, and/or weakness of one or several areas of the body; allergic reactions; (i.e.: anaphylactic reaction); and/or death. Furthermore, the patient was informed of those risks and complications associated with the medications. These include, but are not limited to: allergic reactions (i.e.: anaphylactic or anaphylactoid reaction(s)); adrenal axis suppression; blood sugar elevation that in diabetics may result in ketoacidosis or comma; water retention that in patients with history of congestive heart failure may result in shortness of breath, pulmonary edema, and decompensation with resultant heart failure; weight gain; swelling or edema; medication-induced neural toxicity; particulate matter embolism and blood vessel occlusion with resultant organ, and/or nervous system infarction; and/or aseptic necrosis of one or more joints. Finally, the patient was informed that Medicine is not an exact science; therefore, there is also the possibility of unforeseen or unpredictable risks and/or possible complications that may result in a catastrophic outcome. The patient indicated having understood very clearly. We have given the patient no guarantees and we have made no promises. Enough time was given to the patient to ask questions, all of which were answered to the patient's satisfaction. Julie Mckinney has indicated that she wanted to continue with the procedure. Attestation: I, the ordering provider, attest that I have discussed with the patient the benefits, risks, side-effects, alternatives, likelihood of achieving goals, and potential problems during recovery for the procedure that I have provided informed  consent. Date  Time: 07/10/2020  9:29 AM  Pre-Procedure Preparation:  Monitoring: As per clinic protocol. Respiration, ETCO2, SpO2, BP, heart rate and rhythm monitor placed and checked for adequate function Safety Precautions: Patient was assessed for positional comfort and pressure points before starting the procedure. Time-out: I initiated and conducted the "Time-out" before starting the procedure, as per protocol. The patient was asked to participate by confirming the accuracy of the "Time Out" information. Verification of the correct person, site, and procedure were performed and confirmed by me, the nursing staff, and the patient. "Time-out" conducted as per Joint Commission's Universal Protocol (UP.01.01.01). Time: 4696  Description of Procedure:          Target Area: Superior, posterior, aspect of the sacroiliac fissure Approach: Posterior, paraspinal, ipsilateral approach. Area Prepped: Entire Lower Lumbosacral Region DuraPrep (Iodine Povacrylex [0.7% available iodine] and Isopropyl Alcohol, 74% w/w) Safety Precautions: Aspiration looking for blood return was conducted prior to all injections. At no point did we inject any substances, as a needle was being advanced. No attempts were made at seeking any paresthesias. Safe injection practices and needle disposal techniques used. Medications properly checked for expiration dates. SDV (single dose vial) medications used. Description of the Procedure: Protocol guidelines were followed. The patient was placed in position over the procedure table. The target area was identified and the area prepped in the usual manner. Skin & deeper tissues infiltrated with local anesthetic. Appropriate amount of time allowed to pass for local anesthetics to take effect. The procedure needle was advanced under fluoroscopic guidance into the sacroiliac joint until a firm endpoint was obtained. Proper needle placement secured. Negative aspiration confirmed. Solution  injected in intermittent fashion, asking for systemic symptoms every 0.5cc of injectate. The needles were then removed and the area cleansed, making sure to leave some of the prepping solution back to take advantage of its long term bactericidal properties. Vitals:   07/10/20 1035 07/10/20 1045 07/10/20 1055 07/10/20 1100  BP: (!) 150/97 136/80 (!) 153/85 (!) 151/84  Pulse: 82 69 72 70  Resp: 20 19 (!) 21   Temp:      SpO2: 99% 98% 99% 98%  Weight:      Height:        Start Time: 1049 hrs. End Time: 1056 hrs. Materials:  Needle(s) Type: Spinal Needle Gauge: 25G Length: 3.5-in Medication(s): Please see orders for medications and dosing details. 5 cc solution made of 4cc of 0.2% ropivacaine, 1 cc of methylprednisolone, 40 mg/cc.  Injected into the left sacroiliac joint after contrast confirmation.  Afterwards a left piriformis trigger point injection was done 1 cm inferior, 1 cm deep, 1 cm lateral to the inferior fissure of the SI joint.  5 cc solution made of 4cc of 0.2% ropivacaine, 1 cc of Decadron 10 mg/cc. Contrast was injected to confirm piriformis muscle striation.  While injecting, patient did not complain of any pain radiating down her leg.  Imaging Guidance (Non-Spinal):          Type of Imaging Technique: Fluoroscopy Guidance (Non-Spinal) Indication(s): Assistance in needle guidance and placement for procedures requiring needle placement in or near specific anatomical locations not easily accessible without such assistance. Exposure Time: Please see nurses notes. Contrast: Before injecting any contrast, we confirmed that the patient did not have an allergy to iodine, shellfish, or radiological contrast. Once satisfactory needle placement was completed at the desired level, radiological contrast was injected. Contrast injected under live fluoroscopy. No contrast complications. See chart for type and volume of contrast used. Fluoroscopic Guidance: I was personally present during  the use of fluoroscopy. "Tunnel Vision Technique" used to obtain the best possible view of the target area. Parallax error corrected before commencing the procedure. "Direction-depth-direction" technique used to introduce the needle under continuous pulsed fluoroscopy. Once target was reached, antero-posterior, oblique, and lateral fluoroscopic projection used confirm needle placement in all planes. Images permanently stored in EMR. Interpretation: I personally interpreted the imaging intraoperatively. Adequate needle placement confirmed in multiple planes. Appropriate spread of contrast into desired area was observed. No evidence of afferent or efferent intravascular uptake. Permanent images saved into the patient's record.   Post-operative Assessment:  Post-procedure Vital Signs:  Pulse/HCG Rate: 70 (nsr)  Temp: (!) 97.1 F (36.2 C) Resp: (!) 21 BP: (!) 151/84 SpO2: 98 %  EBL: None  Complications: No immediate post-treatment complications observed by team, or reported by patient.  Note: The patient tolerated the entire procedure well. A repeat set of vitals were taken after the procedure and the patient was kept under observation following institutional policy, for this type of procedure. Post-procedural neurological assessment was performed, showing return to baseline, prior to discharge. The patient was provided with post-procedure discharge instructions, including a section on how to identify potential problems. Should any problems arise concerning this procedure, the patient was given instructions to immediately contact us, at any time, without hesitation. In any case, we plan to contact the patient by telephone for a follow-up status report regarding this interventional procedure.  Comments:  No additional relevant information.  Plan of Care  Orders:  Orders Placed This Encounter  Procedures   DG PAIN [facility] C-ARM 1-60 MIN NO REPORT    Intraoperative interpretation by procedural  physician at [facility].    Standing Status:   Standing    Number of Occurrences:   1    Order Specific Question:   Reason for exam:    Answer:   Assistance in needle guidance and placement for procedures requiring needle placement in  or near specific anatomical locations not easily accessible without such assistance.   Chronic Opioid Analgesic:  Butrans patch 15mcg/hr, continue tramadol 50 mg every 8 hours as needed    Medications ordered for procedure: Meds ordered this encounter  Medications   iohexol (OMNIPAQUE) 180 MG/ML injection 10 mL    Must be Myelogram-compatible. If not available, you may substitute with a water-soluble, non-ionic, hypoallergenic, myelogram-compatible radiological contrast medium.   lidocaine (XYLOCAINE) 2 % (with pres) injection 400 mg   dexamethasone (DECADRON) injection 10 mg   ropivacaine (PF) 2 mg/mL (0.2%) (NAROPIN) injection 9 mL   methylPREDNISolone acetate (DEPO-MEDROL) injection 40 mg   Medications administered: We administered iohexol, lidocaine, dexamethasone, ropivacaine (PF) 2 mg/mL (0.2%), and methylPREDNISolone acetate.  See the medical record for exact dosing, route, and time of administration.  Follow-up plan:   Return for Keep sch. appt.     Recent Visits Date Type Provider Dept  06/05/20 Office Visit Gillis Santa, MD [facility]  05/16/20 Telemedicine Gillis Santa, MD [facility]  Showing recent visits within past 90 days and meeting all other requirements Today's Visits Date Type Provider Dept  07/10/20 Procedure visit Gillis Santa, MD [facility]  Showing today's visits and meeting all other requirements Future Appointments Date Type Provider Dept  09/05/20 Appointment Gillis Santa, MD [facility]  Showing future appointments within next 90 days and meeting all other requirements Disposition: Discharge home  Discharge (Date  Time): 07/10/2020; 1103 hrs.   Primary Care  Physician: Dion Body, MD Location: [facility] Note by: Gillis Santa, MD Date: 07/10/2020; Time: 12:53 PM  Disclaimer:  Medicine is not an exact science. The only guarantee in medicine is that nothing is guaranteed. It is important to note that the decision to proceed with this intervention was based on the information collected from the patient. The Data and conclusions were drawn from the patient's questionnaire, the interview, and the physical examination. Because the information was provided in large part by the patient, it cannot be guaranteed that it has not been purposely or unconsciously manipulated. Every effort has been made to obtain as much relevant data as possible for this evaluation. It is important to note that the conclusions that lead to this procedure are derived in large part from the available data. Always take into account that the treatment will also be dependent on availability of resources and existing treatment guidelines, considered by other Pain Management Practitioners as being common knowledge and practice, at the time of the intervention. For Medico-Legal purposes, it is also important to point out that variation in procedural techniques and pharmacological choices are the acceptable norm. The indications, contraindications, technique, and results of the above procedure should only be interpreted and judged by a Board-Certified Interventional Pain Specialist with extensive familiarity and expertise in the same exact procedure and technique.

## 2020-07-11 ENCOUNTER — Telehealth: Payer: Self-pay

## 2020-07-11 NOTE — Telephone Encounter (Signed)
Post procedure phone call.  LM 

## 2020-07-13 ENCOUNTER — Telehealth: Payer: Self-pay

## 2020-07-13 DIAGNOSIS — G894 Chronic pain syndrome: Secondary | ICD-10-CM

## 2020-07-13 DIAGNOSIS — M47818 Spondylosis without myelopathy or radiculopathy, sacral and sacrococcygeal region: Secondary | ICD-10-CM

## 2020-07-13 DIAGNOSIS — G5702 Lesion of sciatic nerve, left lower limb: Secondary | ICD-10-CM

## 2020-07-13 MED ORDER — BUPRENORPHINE 20 MCG/HR TD PTWK: 1.0000 | MEDICATED_PATCH | TRANSDERMAL | 2 refills | Status: DC

## 2020-07-13 NOTE — Telephone Encounter (Signed)
She states she needs a refill on her patches.

## 2020-08-07 ENCOUNTER — Telehealth (INDEPENDENT_AMBULATORY_CARE_PROVIDER_SITE_OTHER): Payer: Medicare Other | Admitting: Psychiatry

## 2020-08-07 ENCOUNTER — Encounter: Payer: Self-pay | Admitting: Psychiatry

## 2020-08-07 ENCOUNTER — Other Ambulatory Visit: Payer: Self-pay

## 2020-08-07 DIAGNOSIS — F3342 Major depressive disorder, recurrent, in full remission: Secondary | ICD-10-CM | POA: Diagnosis not present

## 2020-08-07 DIAGNOSIS — G4701 Insomnia due to medical condition: Secondary | ICD-10-CM

## 2020-08-07 DIAGNOSIS — F411 Generalized anxiety disorder: Secondary | ICD-10-CM

## 2020-08-07 MED ORDER — BUSPIRONE HCL 7.5 MG PO TABS: 7.5000 mg | ORAL_TABLET | Freq: Two times a day (BID) | ORAL | 1 refills | Status: DC

## 2020-08-07 MED ORDER — CITALOPRAM HYDROBROMIDE 20 MG PO TABS: 30.0000 mg | ORAL_TABLET | Freq: Every day | ORAL | 1 refills | Status: DC

## 2020-08-07 NOTE — Progress Notes (Signed)
Virtual Visit via Telephone Note  I connected with Julie Mckinney on 08/07/20 at  2:00 PM EDT by telephone and verified that I am speaking with the correct person using two identifiers. Location Provider Location : [facility] Patient Location : Home  Participants: Patient , Provider   I discussed the limitations, risks, security and privacy concerns of performing an evaluation and management Mckinney by telephone and the availability of in person appointments. I also discussed with the patient that there may be a patient responsible charge related to this Mckinney. The patient expressed understanding and agreed to proceed.   I discussed the assessment and treatment plan with the patient. The patient was provided an opportunity to ask questions and all were answered. The patient agreed with the plan and demonstrated an understanding of the instructions.   The patient was advised to call back or seek an in-person evaluation if the symptoms worsen or if the condition fails to improve as anticipated.                                                                 [place] MD OP Progress Note  08/07/2020 2:09 PM Julie Mckinney  MRN:  [id number]  Chief Complaint:  Chief Complaint   Follow-up; Depression    HPI: Julie Mckinney is a 86 year old Caucasian female lives in a senior living community in [place], married, has a history of MDD, GAD, COPD, chronic pain, rheumatoid arthritis, history of breast cancer in remission, on tamoxifen, chronic pain was evaluated by telemedicine today.  Patient today reports she is currently doing well with regards to her mood.  She is compliant on the Celexa and the BuSpar.  She denies side effects.  She continues to be in pain, chronic pain of her lower back.  She currently rates it at a 6 out of 10.  She continues to have medications for the same which helps to some extent.  She does have upcoming appointment with pain management.  She reports sleep is  good.  She reports appetite is fair.  She denies any suicidality, homicidality or perceptual disturbances.  Patient denies any other concerns today.    Visit Diagnosis:    ICD-10-CM   1. MDD (major depressive disorder), recurrent, in full remission ([place])  F33.42 citalopram (CELEXA) 20 MG tablet    2. GAD (generalized anxiety disorder)  F41.1 citalopram (CELEXA) 20 MG tablet    busPIRone (BUSPAR) 7.5 MG tablet    3. Insomnia due to medical condition  G47.01    pain, mood      Past Psychiatric History: I have reviewed past psychiatric history from progress note on 10/23/2017.  Past trials of Celexa, mirtazapine, melatonin  Past Medical History:  Past Medical History:  Diagnosis Date   Anxiety    Asthma    Breast cancer ([place]) Right   COPD (chronic obstructive pulmonary disease) ([place])    Depression    Personal history of radiation therapy     Past Surgical History:  Procedure Laterality Date   ABDOMINAL HYSTERECTOMY     APPENDECTOMY     BACK SURGERY     BREAST BIOPSY Right 2015   +   BREAST EXCISIONAL BIOPSY Right 1975   neg   BREAST LUMPECTOMY Right 2015   invasive lobular  carcinoma   CHOLECYSTECTOMY     COLONOSCOPY     Scheduled for July 2018   COLONOSCOPY WITH PROPOFOL N/A 08/14/2016   Procedure: COLONOSCOPY WITH PROPOFOL;  Surgeon: Manya Silvas, MD;  Location[facility] ENDOSCOPY;  Mckinney: Endoscopy;  Laterality: N/A;   ESOPHAGOGASTRODUODENOSCOPY (EGD) WITH PROPOFOL N/A 08/14/2016   Procedure: ESOPHAGOGASTRODUODENOSCOPY (EGD) WITH PROPOFOL;  Surgeon: Manya Silvas, MD;  Location[facility] ENDOSCOPY;  Mckinney: Endoscopy;  Laterality: N/A;    Family Psychiatric History: I have reviewed family psychiatric history from progress note on 10/23/2017  Family History:  Family History  Problem Relation Age of Onset   Cancer Father    Breast cancer Neg Hx     Social History: Reviewed social history from progress note on 10/23/2017 Social History   Socioeconomic  History   Marital status: Married    Spouse name: Julie Mckinney   Number of children: 2   Years of education: Not on file   Highest education level: Some college, no degree  Occupational History   Not on file  Tobacco Use   Smoking status: Former    Packs/day: 0.50    Years: 30.00    Pack years: 15.00    Types: Cigarettes    Quit date: 06/22/1995    Years since quitting: 25.1   Smokeless tobacco: Never  Vaping Use   Vaping Use: Never used  Substance and Sexual Activity   Alcohol use: Yes    Alcohol/week: 1.0 standard drink    Types: 1 Glasses of wine per week    Comment: Occasional-rare   Drug use: No   Sexual activity: Not Currently  Other Topics Concern   Not on file  Social History Narrative   Not on file   Social Determinants of Health   Financial Resource Strain: Not on file  Food Insecurity: Not on file  Transportation Needs: Not on file  Physical Activity: Not on file  Stress: Not on file  Social Connections: Not on file    Allergies:  Allergies  Allergen Reactions   Tape Rash    Metabolic Disorder Labs: No results found for: HGBA1C, MPG No results found for: PROLACTIN No results found for: CHOL, TRIG, HDL, CHOLHDL, VLDL, LDLCALC No results found for: TSH  Therapeutic Level Labs: No results found for: LITHIUM No results found for: VALPROATE No components found for:  CBMZ  Current Medications: Current Outpatient Medications  Medication Sig Dispense Refill   acetaminophen (TYLENOL) 500 MG tablet Take by mouth.     albuterol (PROVENTIL HFA;VENTOLIN HFA) 108 (90 Base) MCG/ACT inhaler Inhale 2 puffs into the lungs every 6 (six) hours as needed.      atorvastatin (LIPITOR) 40 MG tablet TAKE ONE TABLET BY MOUTH EVERY EVENING     Azelastine-Fluticasone 137-50 MCG/ACT SUSP Place 1 spray into the nose daily.      buprenorphine (BUTRANS) 20 MCG/HR PTWK Place 1 patch onto the skin once a week. 4 patch 2   busPIRone (BUSPAR) 7.5 MG tablet Take 1 tablet (7.5 mg  total) by mouth 2 (two) times daily. 180 tablet 1   calcium carbonate (OS-CAL) 600 MG TABS tablet Take 600 mg by mouth 2 (two) times daily.     citalopram (CELEXA) 20 MG tablet Take 1.5 tablets (30 mg total) by mouth daily. 135 tablet 1   clobetasol cream (TEMOVATE) 0.05 % Apply topically.     doxycycline (VIBRA-TABS) 100 MG tablet Take 100 mg by mouth 2 (two) times daily.     fluticasone (FLONASE)  50 MCG/ACT nasal spray Place 1 spray into the nose as needed. (Patient not taking: Reported on 07/10/2020)     Fluticasone-Umeclidin-Vilant (TRELEGY ELLIPTA) 100-62.5-25 MCG/INH AEPB Inhale 1 puff into the lungs daily.     GLUCOSAMINE SULFATE PO Take 1 capsule by mouth 2 (two) times daily.     levothyroxine (SYNTHROID, LEVOTHROID) 50 MCG tablet Take 50 mcg by mouth daily.     Multiple Vitamin (MULTI-VITAMINS) TABS Take 1 tablet by mouth daily.     Nebulizers MISC Inhale 1 Dose into the lungs as needed.      omeprazole (PRILOSEC) 20 MG capsule Take 20 mg by mouth daily.     PATADAY 0.2 % SOLN Apply 1 drop to eye as needed.      Potassium 99 MG TABS Take 1 tablet by mouth daily.      predniSONE (DELTASONE) 10 MG tablet Take 10 mg by mouth 2 (two) times daily.     traMADol (ULTRAM) 50 MG tablet Take 1 tablet (50 mg total) by mouth every 8 (eight) hours as needed. 90 tablet 2   vitamin B-12 (CYANOCOBALAMIN) 1000 MCG tablet Take 1,000 mcg by mouth daily.     No current facility-administered medications for this visit.     Musculoskeletal: Strength & Muscle Tone:  UTA Gait & Station:  UTA Patient leans: N/A  Psychiatric Specialty Exam: Review of Systems  Musculoskeletal:  Positive for back pain.  All other systems reviewed and are negative.  There were no vitals taken for this visit.There is no height or weight on file to calculate BMI.  General Appearance:  UTA  Eye Contact:   UTA  Speech:  Clear and Coherent  Volume:  Normal  Mood:  Euthymic  Affect:   UTA  Thought Process:  Goal Directed  and Descriptions of Associations: Intact  Orientation:  Full (Time, Place, and Person)  Thought Content: Logical   Suicidal Thoughts:  No  Homicidal Thoughts:  No  Memory:  Immediate;   Fair Recent;   Fair Remote;   Fair  Judgement:  Fair  Insight:  Fair  Psychomotor Activity:   UTA  Concentration:  Concentration: Fair and Attention Span: Fair  Recall:  [employer] of Knowledge: Fair  Language: Fair  Akathisia:  No  Handed:  Right  AIMS (if indicated): not done  Assets:  Communication Skills Desire for Improvement Housing Social Support  ADL's:  Intact  Cognition: WNL  Sleep:  Fair   Screenings: GAD-7    Flowsheet Row Video Visit from 05/11/2020 in [facility]  Total GAD-7 Score 4      PHQ2-9    [place] Video Visit from 08/07/2020 in [facility] Procedure visit from 07/10/2020 in [place] Video Visit from 05/11/2020 in [place] [facility] Office Visit from 12/06/2019 in [facility]  PHQ-2 Total Score 0 0 0 0        Assessment and Plan: Julie Mckinney is a 86 year old female, married, lives in an independent senior living community in [place], has a history of MDD, GAD, sleep problems, chronic pain was evaluated by telemedicine today.  Patient is currently stable.  Plan MDD in full remission Celexa 30 mg p.o. daily  GAD-stable BuSpar 7.5 mg p.o. twice daily Celexa 30 mg p.o. daily Hydroxyzine 25-50 mg p.o. daily as needed  Insomnia-stable Will monitor closely  Follow-up in clinic in 5 months in person or sooner if  needed.   I have spent at least 20 minutes non face to face with patient today.     This note was generated in part or whole with voice recognition software. Voice recognition is usually quite accurate but there are transcription errors that can and very often do occur.  I apologize for any typographical errors that were not detected and corrected.       Ursula Alert, MD 08/08/2020, 8:18 AM

## 2020-09-05 ENCOUNTER — Other Ambulatory Visit: Payer: Self-pay

## 2020-09-05 ENCOUNTER — Ambulatory Visit
Payer: Medicare Other | Attending: Student in an Organized Health Care Education/Training Program | Admitting: Student in an Organized Health Care Education/Training Program

## 2020-09-05 ENCOUNTER — Encounter: Payer: Self-pay | Admitting: Student in an Organized Health Care Education/Training Program

## 2020-09-05 VITALS — BP 125/78 | HR 69 | Temp 97.1°F | Resp 16 | Ht 60.0 in | Wt 99.0 lb

## 2020-09-05 DIAGNOSIS — C50911 Malignant neoplasm of unspecified site of right female breast: Secondary | ICD-10-CM | POA: Diagnosis present

## 2020-09-05 DIAGNOSIS — M792 Neuralgia and neuritis, unspecified: Secondary | ICD-10-CM | POA: Insufficient documentation

## 2020-09-05 DIAGNOSIS — Z170 Estrogen receptor positive status [ER+]: Secondary | ICD-10-CM | POA: Diagnosis present

## 2020-09-05 DIAGNOSIS — M5416 Radiculopathy, lumbar region: Secondary | ICD-10-CM | POA: Insufficient documentation

## 2020-09-05 DIAGNOSIS — G5702 Lesion of sciatic nerve, left lower limb: Secondary | ICD-10-CM

## 2020-09-05 DIAGNOSIS — G8929 Other chronic pain: Secondary | ICD-10-CM | POA: Diagnosis present

## 2020-09-05 DIAGNOSIS — G894 Chronic pain syndrome: Secondary | ICD-10-CM | POA: Insufficient documentation

## 2020-09-05 DIAGNOSIS — Z0289 Encounter for other administrative examinations: Secondary | ICD-10-CM

## 2020-09-05 DIAGNOSIS — M47818 Spondylosis without myelopathy or radiculopathy, sacral and sacrococcygeal region: Secondary | ICD-10-CM | POA: Diagnosis present

## 2020-09-05 DIAGNOSIS — M961 Postlaminectomy syndrome, not elsewhere classified: Secondary | ICD-10-CM | POA: Diagnosis present

## 2020-09-05 MED ORDER — TRAMADOL HCL 50 MG PO TABS: 50.0000 mg | ORAL_TABLET | Freq: Three times a day (TID) | ORAL | 1 refills | Status: DC | PRN

## 2020-09-05 MED ORDER — BUPRENORPHINE 20 MCG/HR TD PTWK: 1.0000 | MEDICATED_PATCH | TRANSDERMAL | 0 refills | Status: DC

## 2020-09-05 NOTE — Progress Notes (Signed)
Nursing Pain Medication Assessment:  Safety precautions to be maintained throughout the outpatient stay will include: orient to surroundings, keep bed in low position, maintain call bell within reach at all times, provide assistance with transfer out of bed and ambulation.  Medication Inspection Compliance: Ms. Oleksak did not comply with our request to bring her pills to be counted. She was reminded that bringing the medication bottles, even when empty, is a requirement.  Medication: None brought in. Pill/Patch Count: None available to be counted. Bottle Appearance: No container available. Did not bring bottle(s) to appointment. Filled Date: N/A Last Medication intake:   last week

## 2020-09-05 NOTE — Progress Notes (Signed)
PROVIDER NOTE: Information contained herein reflects review and annotations entered in association with encounter. Interpretation of such information and data should be left to medically-trained personnel. Information provided to patient can be located elsewhere in the medical record under "Patient Instructions". Document created using STT-dictation technology, any transcriptional errors that may result from process are unintentional.    Patient: Julie Mckinney  Service Category: E/M  Provider: Gillis Santa, MD  DOB: September 04, 1934  DOS: 09/05/2020  Specialty: Interventional Pain Management  MRN: [id number]  Setting: Ambulatory outpatient  PCP: Dion Body, MD  Type: Established Patient    Referring Provider: Dion Body, MD  Location: Office  Delivery: Face-to-face     HPI  [name], a 86 y.o. year old female, is here today because of her SI joint arthritis [M47.818]. Julie Mckinney's primary complain today is Hip Pain (left) and Leg Pain (left) Last encounter: My last encounter with her was on 07/10/2020. Pertinent problems: Ms. Gaertner has History of CVA (cerebrovascular accident) without residual deficits; MDD (major depressive disorder), recurrent episode, mild ([place]); T10 vertebral fracture ([place]); Closed stable burst fracture of T12 vertebra ([place]); Lumbar post-laminectomy syndrome; MDD (major depressive disorder), recurrent, in full remission ([place]); Pain management contract signed; and Chronic pain syndrome on their pertinent problem list. Pain Assessment: Severity of Chronic pain is reported as a 9 /10. Location: Hip Left/radiates downt he side of left leg to ankle. Onset: More than a month ago. Quality: Aching, Constant. Timing: Constant. Modifying factor(s): Tramadol and tylenol. Vitals:  height is 5' (1.524 m) and weight is 99 lb (44.9 kg). Her temporal temperature is 97.1 F (36.2 C) (abnormal). Her blood pressure is 125/78 and her pulse is 69. Her respiration is 16 and  oxygen saturation is 99%.   Reason for encounter: both, medication management and post-procedure assessment.   Postprocedural evaluation after diagnostic left sacroiliac and left piriformis injection.  Unfortunately, these did not provide any significant long-lasting functional pain relief. Of note patient has been without her Butrans patch for the last 2 weeks secondary to the rash that she sustained.  She states that her rash is gotten better.  She had a rash on her upper back.  I reviewed patch application sites.  She states that her pain has increased over the last 2 weeks which indicates that Butrans patch is helping to manage her baseline pain.  I recommend that she apply fluticasone spray and allowing that to dry prior to patch application.  I also reminded her to change patch application sites weekly.  Patient endorsed understanding.  I will also refill her tramadol as below which she takes 50 mg 3 times a day.  Post-Procedure Evaluation  Procedure (07/10/2020):   Type: Diagnostic Sacroiliac Joint Steroid Injection        and left piriformis injection under fluoroscopy  Region: Inferior Lumbosacral Region Level: PIIS (Posterior Inferior Iliac Spine) Laterality: Left Anxiolysis: Please see nurses note.  Effectiveness during initial hour after procedure (Ultra-Short Term Relief): 20 %   Local anesthetic used: Long-acting (4-6 hours) Effectiveness: Defined as any analgesic benefit obtained secondary to the administration of local anesthetics. This carries significant diagnostic value as to the etiological location, or anatomical origin, of the pain. Duration of benefit is expected to coincide with the duration of the local anesthetic used.  Effectiveness during initial 4-6 hours after procedure (Short-Term Relief): 20 %   Long-term benefit: Defined as any relief past the pharmacologic duration of the local anesthetics.  Effectiveness past the initial 6 hours  after procedure (Long-Term  Relief): 0 %   Benefits, current: Defined as benefit present at the time of this evaluation.   Analgesia:  No benefit   Pharmacotherapy Assessment  Analgesic: Butrans patch 51mg/hr, continue tramadol 50 mg every 8 hours as needed    Monitoring: [place] PMP: PDMP reviewed during this encounter.       Pharmacotherapy: No side-effects or adverse reactions reported. Compliance: No problems identified. Effectiveness: Clinically acceptable.  TDewayne Shorter RN  09/05/2020 10:34 AM  Sign when Signing Visit Nursing Pain Medication Assessment:  Safety precautions to be maintained throughout the outpatient stay will include: orient to surroundings, keep bed in low position, maintain call bell within reach at all times, provide assistance with transfer out of bed and ambulation.  Medication Inspection Compliance: Ms. REisenmengerdid not comply with our request to bring her pills to be counted. She was reminded that bringing the medication bottles, even when empty, is a requirement.  Medication: None brought in. Pill/Patch Count: None available to be counted. Bottle Appearance: No container available. Did not bring bottle(s) to appointment. Filled Date: N/A Last Medication intake:   last week     UDS:  No results found for: SUMMARY   ROS  Constitutional: Denies any fever or chills Gastrointestinal: No reported hemesis, hematochezia, vomiting, or acute GI distress Musculoskeletal:  Low back, bilateral hip, bilateral buttock pain left greater than right Neurological: No reported episodes of acute onset apraxia, aphasia, dysarthria, agnosia, amnesia, paralysis, loss of coordination, or loss of consciousness  Medication Review  Azelastine-Fluticasone, Fluticasone-Umeclidin-Vilant, Glucosamine Sulfate, Multi-Vitamins, Nebulizers, Olopatadine HCl, Potassium, acetaminophen, albuterol, atorvastatin, buprenorphine, busPIRone, calcium carbonate, citalopram, clobetasol cream, doxycycline, fluticasone,  levothyroxine, omeprazole, predniSONE, traMADol, and vitamin B-12  History Review  Allergy: Ms. RDemarcusis allergic to tape. Drug: Ms. RDemond reports no history of drug use. Alcohol:  reports current alcohol use of about 1.0 standard drink per week. Tobacco:  reports that she quit smoking about 25 years ago. Her smoking use included cigarettes. She has a 15.00 pack-year smoking history. She has never used smokeless tobacco. Social: Ms. RPankonin reports that she quit smoking about 25 years ago. Her smoking use included cigarettes. She has a 15.00 pack-year smoking history. She has never used smokeless tobacco. She reports current alcohol use of about 1.0 standard drink per week. She reports that she does not use drugs. Medical:  has a past medical history of Anxiety, Asthma, [place]), COPD (chronic obstructive pulmonary disease) ([place], Depression, and Personal history of radiation therapy. Surgical: Ms. RChargois has a past surgical history that includes Appendectomy; Colonoscopy; Cholecystectomy; Abdominal hysterectomy; Esophagogastroduodenoscopy (egd) with propofol (N/A, 08/14/2016); Colonoscopy with propofol (N/A, 08/14/2016); Back surgery; Breast biopsy (Right, 2015); Breast excisional biopsy (Right, 1975); and Breast lumpectomy (Right, 2015). Family: family history includes Cancer in her father.  Laboratory Chemistry Profile   Renal Lab Results  Component Value Date   BUN 27 (H) 01/19/2019   CREATININE 0.56 01/19/2019   GFRAA >60 01/19/2019   GFRNONAA >60 01/19/2019    Hepatic Lab Results  Component Value Date   AST 20 01/17/2019   ALT 18 01/17/2019   ALBUMIN 3.5 01/17/2019   ALKPHOS 34 (L) 01/17/2019    Electrolytes Lab Results  Component Value Date   NA 142 01/19/2019   K 4.0 01/19/2019   CL 111 01/19/2019   CALCIUM 8.5 (L) 01/19/2019    Bone No results found for: [place] [id number] 2[place] 25OHVITD2, 25OHVITD3, TESTOFREE,  TESTOSTERONE  Inflammation (CRP: Acute Phase) (ESR: Chronic Phase) No results found for: CRP, ESRSEDRATE, LATICACIDVEN       Note: Above Lab results reviewed.   Physical Exam  General appearance: Well nourished, well developed, and well hydrated. In no apparent acute distress Mental status: Alert, oriented x 3 (person, place, & time)       Respiratory: No evidence of acute respiratory distress Eyes: PERLA Vitals: BP 125/78 (BP Location: Right Arm, Patient Position: Sitting, Cuff Size: Normal)   Pulse 69   Temp (!) 97.1 F (36.2 C) (Temporal)   Resp 16   Ht 5' (1.524 m)   Wt 99 lb (44.9 kg)   SpO2 99%   BMI 19.33 kg/m  BMI: Estimated body mass index is 19.33 kg/m as calculated from the following:   Height as of this encounter: 5' (1.524 m).   Weight as of this encounter: 99 lb (44.9 kg). Ideal: Female patients must weigh at least 45.5 kg to calculate ideal body weight    Lumbar Exam  Skin & Axial Inspection: Thoraco-lumbar Scoliosis Alignment: Scoliosis detected Functional ROM: Pain restricted ROM affecting both sides Stability: No instability detected Muscle Tone/Strength: Functionally intact. No obvious neuro-muscular anomalies detected. Sensory (Neurological): Dermatomal pain pattern bilaterally Palpation: Tender to palpation       Provocative Tests: Hyperextension/rotation test: deferred today       Lumbar quadrant test (Kemp's test): (+) due to pain. Lateral bending test: deferred today       Patrick's Maneuver: deferred today                   FABER* test: deferred today                   S-I anterior distraction/compression test: deferred today         S-I lateral compression test: deferred today         S-I Thigh-thrust test: deferred today         S-I Gaenslen's test: deferred today         *(Flexion, ABduction and External Rotation)   Gait & Posture Assessment  Ambulation: Limited Gait: Antalgic Posture: Difficulty standing up straight, due to pain     Lower Extremity Exam      Side: Right lower extremity   Side: Left lower extremity  Stability: No instability observed           Stability: No instability observed          Skin & Extremity Inspection: Skin color, temperature, and hair growth are WNL. No peripheral edema or cyanosis. No masses, redness, swelling, asymmetry, or associated skin lesions. No contractures.   Skin & Extremity Inspection: Skin color, temperature, and hair growth are WNL. No peripheral edema or cyanosis. No masses, redness, swelling, asymmetry, or associated skin lesions. No contractures.  Functional ROM: Pain restricted ROM for all joints of the lower extremity           Functional ROM: Pain restricted ROM for all joints of the lower extremity          Muscle Tone/Strength: Functionally intact. No obvious neuro-muscular anomalies detected.   Muscle Tone/Strength: Functionally intact. No obvious neuro-muscular anomalies detected.  Sensory (Neurological): Dermatomal pain pattern         Sensory (Neurological): Dermatomal pain pattern        DTR: Patellar: deferred today Achilles: deferred today Plantar: deferred today   DTR: Patellar: deferred today Achilles: deferred today Plantar: deferred today  Palpation: No palpable  anomalies   Palpation: No palpable anomalies      Assessment   Status Diagnosis  Persistent Persistent Persistent 1. SI joint arthritis (left)   2. Piriformis syndrome, left   3. Intractable neuropathic pain of left foot   4. Lumbar post-laminectomy syndrome   5. Chronic radicular lumbar pain (LEFT)   6. Pain management contract signed   7. Chronic pain syndrome   8. Malignant neoplasm of right breast in female, estrogen receptor positive, unspecified site of breast [facility])       Plan of Care    Ms. Nelani Schmelzle has a current medication list which includes the following long-term medication(s): albuterol, azelastine-fluticasone, calcium carbonate, citalopram, fluticasone,  levothyroxine, omeprazole, and potassium.  Pharmacotherapy (Medications Ordered): Meds ordered this encounter  Medications   buprenorphine (BUTRANS) 20 MCG/HR PTWK    Sig: Place 1 patch onto the skin once a week for 28 days.    Dispense:  4 patch    Refill:  0    Chronic Pain: STOP Act (Not applicable) Fill 1 day early if closed on refill date. Avoid benzodiazepines within 8 hours of opioids   traMADol (ULTRAM) 50 MG tablet    Sig: Take 1 tablet (50 mg total) by mouth every 8 (eight) hours as needed for severe pain. Must last 30 days    Dispense:  90 tablet    Refill:  1    Chronic Pain: STOP Act (Not applicable) Fill 1 day early if closed on refill date. Avoid benzodiazepines within 8 hours of opioids    Follow-up plan:   Return in about 8 weeks (around 10/31/2020) for Medication Management, in person.    Recent Visits Date Type Provider Dept  07/10/20 Procedure visit Gillis Santa, MD [facility]  Showing recent visits within past 90 days and meeting all other requirements Today's Visits Date Type Provider Dept  09/05/20 Office Visit Gillis Santa, MD [facility]  Showing today's visits and meeting all other requirements Future Appointments Date Type Provider Dept  10/31/20 Appointment Gillis Santa, MD [facility]  Showing future appointments within next 90 days and meeting all other requirements I discussed the assessment and treatment plan with the patient. The patient was provided an opportunity to ask questions and all were answered. The patient agreed with the plan and demonstrated an understanding of the instructions.  Patient advised to call back or seek an in-person evaluation if the symptoms or condition worsens.  Duration of encounter: 22mnutes.  Note by: BGillis Santa MD Date: 09/05/2020; Time: 11:23 AM

## 2020-10-10 ENCOUNTER — Other Ambulatory Visit: Payer: Self-pay | Admitting: Internal Medicine

## 2020-10-10 DIAGNOSIS — Z1231 Encounter for screening mammogram for malignant neoplasm of breast: Secondary | ICD-10-CM

## 2020-10-25 ENCOUNTER — Other Ambulatory Visit: Payer: Self-pay

## 2020-10-25 ENCOUNTER — Ambulatory Visit
Admission: RE | Admit: 2020-10-25 | Discharge: 2020-10-25 | Disposition: A | Discharge status: Home / Self Care | Payer: Medicare Other | Source: Ambulatory Visit | Attending: Internal Medicine | Admitting: Internal Medicine

## 2020-10-25 DIAGNOSIS — Z1231 Encounter for screening mammogram for malignant neoplasm of breast: Secondary | ICD-10-CM | POA: Insufficient documentation

## 2020-10-31 ENCOUNTER — Other Ambulatory Visit: Payer: Self-pay

## 2020-10-31 ENCOUNTER — Encounter: Payer: Self-pay | Admitting: Student in an Organized Health Care Education/Training Program

## 2020-10-31 ENCOUNTER — Ambulatory Visit
Payer: Medicare Other | Attending: Student in an Organized Health Care Education/Training Program | Admitting: Student in an Organized Health Care Education/Training Program

## 2020-10-31 VITALS — BP 135/74 | HR 68 | Temp 97.2°F | Resp 16 | Ht 61.0 in | Wt 100.0 lb

## 2020-10-31 DIAGNOSIS — G894 Chronic pain syndrome: Secondary | ICD-10-CM | POA: Insufficient documentation

## 2020-10-31 DIAGNOSIS — M5416 Radiculopathy, lumbar region: Secondary | ICD-10-CM

## 2020-10-31 DIAGNOSIS — G5702 Lesion of sciatic nerve, left lower limb: Secondary | ICD-10-CM | POA: Insufficient documentation

## 2020-10-31 DIAGNOSIS — Z0289 Encounter for other administrative examinations: Secondary | ICD-10-CM | POA: Insufficient documentation

## 2020-10-31 DIAGNOSIS — G8929 Other chronic pain: Secondary | ICD-10-CM | POA: Insufficient documentation

## 2020-10-31 DIAGNOSIS — M961 Postlaminectomy syndrome, not elsewhere classified: Secondary | ICD-10-CM | POA: Insufficient documentation

## 2020-10-31 DIAGNOSIS — M792 Neuralgia and neuritis, unspecified: Secondary | ICD-10-CM | POA: Diagnosis present

## 2020-10-31 DIAGNOSIS — M47818 Spondylosis without myelopathy or radiculopathy, sacral and sacrococcygeal region: Secondary | ICD-10-CM | POA: Diagnosis not present

## 2020-10-31 MED ORDER — BUPRENORPHINE 20 MCG/HR TD PTWK: 1.0000 | MEDICATED_PATCH | TRANSDERMAL | 2 refills | Status: AC

## 2020-10-31 MED ORDER — TRAMADOL HCL 50 MG PO TABS: 50.0000 mg | ORAL_TABLET | Freq: Three times a day (TID) | ORAL | 2 refills | Status: DC | PRN

## 2020-10-31 NOTE — Progress Notes (Signed)
PROVIDER NOTE: Information contained herein reflects review and annotations entered in association with encounter. Interpretation of such information and data should be left to medically-trained personnel. Information provided to patient can be located elsewhere in the medical record under "Patient Instructions". Document created using STT-dictation technology, any transcriptional errors that may result from process are unintentional.    Patient: Julie Mckinney  Service Category: E/M  Provider: Gillis Santa, MD  DOB: [birth year]  DOS: 10/31/2020  Specialty: Interventional Pain Management  MRN: [id number]  Setting: Ambulatory outpatient  PCP: Dion Body, MD  Type: Established Patient    Referring Provider: Dion Body, MD  Location: Office  Delivery: Face-to-face     HPI  [name], a 86 y.o. year old female, is here today because of her SI joint arthritis [M47.818]. [name] primary complain today is Back Pain (lower) Last encounter: My last encounter with her was on 09/05/20 Pertinent problems: [name] has History of CVA (cerebrovascular accident) without residual deficits; MDD (major depressive disorder), recurrent episode, mild ([place]); T10 vertebral fracture ([place]); Closed stable burst fracture of T12 vertebra ([place]); Lumbar post-laminectomy syndrome; MDD (major depressive disorder), recurrent, in full remission ([place]); Pain management contract signed; and Chronic pain syndrome on their pertinent problem list. Pain Assessment: Severity of Chronic pain is reported as a 8 /10. Location: Back Lower/down side of left leg to ankle. Onset: More than a month ago. Quality: Aching, Constant. Timing: Constant. Modifying factor(s): Tramadol, butrans. Vitals:  height is '5\' 1"'  (1.549 m) and weight is 100 lb (45.4 kg). Her temporal temperature is 97.2 F (36.2 C) (abnormal). Her blood pressure is 135/74 and her pulse is 68. Her respiration is 16 and oxygen saturation is 95%.    Reason for encounter: medication management.   No change in medical history since last visit.  Patient's pain is at baseline.  Patient continues multimodal pain regimen as prescribed.  States that it provides pain relief and improvement in functional status. Patient is having a custom brace made for her right foot to minimize foot eversion.  Pharmacotherapy Assessment  Analgesic: Butrans patch 62mg/hr, continue tramadol 50 mg every 8 hours as needed    Monitoring: [place] PMP: PDMP reviewed during this encounter.       Pharmacotherapy: No side-effects or adverse reactions reported. Compliance: No problems identified. Effectiveness: Clinically acceptable.  WRise Patience RN  10/31/2020 11:48 AM  Sign when Signing Visit Nursing Pain Medication Assessment:  Safety precautions to be maintained throughout the outpatient stay will include: orient to surroundings, keep bed in low position, maintain call bell within reach at all times, provide assistance with transfer out of bed and ambulation.  Medication Inspection Compliance: Julie Mckinney not comply with our request to bring her pills to be counted. She was reminded that bringing the medication bottles, even when empty, is a requirement.  Medication: None brought in. Pill/Patch Count: None available to be counted. Bottle Appearance: No container available. Did not bring bottle(s) to appointment. Filled Date: N/A Last Medication intake:  Today  Last butrans patch applied yesterday  Last Tramadol taken today      UDS:  No results found for: SUMMARY   ROS  Constitutional: Denies any fever or chills Gastrointestinal: No reported hemesis, hematochezia, vomiting, or acute GI distress Musculoskeletal:  Low back, bilateral hip, bilateral buttock pain left greater than right Neurological: No reported episodes of acute onset apraxia, aphasia, dysarthria, agnosia, amnesia, paralysis, loss of coordination, or loss of  consciousness  Medication Review  Azelastine-Fluticasone,  Fluticasone-Umeclidin-Vilant, Glucosamine Sulfate, Multi-Vitamins, Nebulizers, Olopatadine HCl, Potassium, acetaminophen, albuterol, atorvastatin, buprenorphine, busPIRone, calcium carbonate, citalopram, clobetasol cream, doxycycline, fluticasone, levothyroxine, omeprazole, predniSONE, traMADol, and vitamin B-12  History Review  Allergy: [name] is allergic to tape. Drug: [name]  reports no history of drug use. Alcohol:  reports current alcohol use of about 1.0 standard drink per week. Tobacco:  reports that she quit smoking about 25 years ago. Her smoking use included cigarettes. She has a 15.00 pack-year smoking history. She has never used smokeless tobacco. Social: [name]  reports that she quit smoking about 25 years ago. Her smoking use included cigarettes. She has a 15.00 pack-year smoking history. She has never used smokeless tobacco. She reports current alcohol use of about 1.0 standard drink per week. She reports that she does not use drugs. Medical:  has a past medical history of Anxiety, Asthma, Breast cancer ([place]) (Right), COPD (chronic obstructive pulmonary disease) ([place]), Depression, and Personal history of radiation therapy. Surgical: Ms. Buschman  has a past surgical history that includes Appendectomy; Colonoscopy; Cholecystectomy; Abdominal hysterectomy; Esophagogastroduodenoscopy (egd) with propofol (N/A, 08/14/2016); Colonoscopy with propofol (N/A, 08/14/2016); Back surgery; Breast biopsy (Right, 2015); Breast excisional biopsy (Right, 1975); and Breast lumpectomy (Right, 2015). Family: family history includes Cancer in her father.  Laboratory Chemistry Profile   Renal Lab Results  Component Value Date   BUN 27 (H) 01/19/2019   CREATININE 0.56 01/19/2019   GFRAA >60 01/19/2019   GFRNONAA >60 01/19/2019    Hepatic Lab Results  Component Value Date   AST 20 01/17/2019   ALT 18 01/17/2019   ALBUMIN  3.5 01/17/2019   ALKPHOS 34 (L) 01/17/2019    Electrolytes Lab Results  Component Value Date   NA 142 01/19/2019   K 4.0 01/19/2019   CL 111 01/19/2019   CALCIUM 8.5 (L) 01/19/2019    Bone No results found for: VD25OH, VD125OH2TOT, YB6389HT3, SK8768TL5, 25OHVITD1, 25OHVITD2, 25OHVITD3, TESTOFREE, TESTOSTERONE  Inflammation (CRP: Acute Phase) (ESR: Chronic Phase) No results found for: CRP, ESRSEDRATE, LATICACIDVEN       Note: Above Lab results reviewed.   Physical Exam  General appearance: Well nourished, well developed, and well hydrated. In no apparent acute distress Mental status: Alert, oriented x 3 (person, place, & time)       Respiratory: No evidence of acute respiratory distress Eyes: PERLA Vitals: BP 135/74   Pulse 68   Temp (!) 97.2 F (36.2 C) (Temporal)   Resp 16   Ht '5\' 1"'  (1.549 m)   Wt 100 lb (45.4 kg)   SpO2 95%   BMI 18.89 kg/m  BMI: Estimated body mass index is 18.89 kg/m as calculated from the following:   Height as of this encounter: '5\' 1"'  (1.549 m).   Weight as of this encounter: 100 lb (45.4 kg). Ideal: Female patients must weigh at least 45.5 kg to calculate ideal body weight    Lumbar Exam  Skin & Axial Inspection: Thoraco-lumbar Scoliosis Alignment: Scoliosis detected Functional ROM: Pain restricted ROM affecting both sides Stability: No instability detected Muscle Tone/Strength: Functionally intact. No obvious neuro-muscular anomalies detected. Sensory (Neurological): Dermatomal pain pattern bilaterally Palpation: Tender to palpation       Provocative Tests: Hyperextension/rotation test: deferred today       Lumbar quadrant test (Kemp's test): (+) due to pain. Lateral bending test: deferred today       Patrick's Maneuver: deferred today  FABER* test: deferred today                   S-I anterior distraction/compression test: deferred today         S-I lateral compression test: deferred today         S-I Thigh-thrust  test: deferred today         S-I Gaenslen's test: deferred today         *(Flexion, ABduction and External Rotation)   Gait & Posture Assessment  Ambulation: Limited Gait: Antalgic Posture: Difficulty standing up straight, due to pain    Lower Extremity Exam      Side: Right lower extremity   Side: Left lower extremity  Stability: No instability observed           Stability: No instability observed          Skin & Extremity Inspection: Skin color, temperature, and hair growth are WNL. No peripheral edema or cyanosis. No masses, redness, swelling, asymmetry, or associated skin lesions. No contractures.   Skin & Extremity Inspection: Skin color, temperature, and hair growth are WNL. No peripheral edema or cyanosis. No masses, redness, swelling, asymmetry, or associated skin lesions. No contractures.  Functional ROM: Pain restricted ROM for all joints of the lower extremity           Functional ROM: Pain restricted ROM for all joints of the lower extremity          Muscle Tone/Strength: Functionally intact. No obvious neuro-muscular anomalies detected.   Muscle Tone/Strength: Functionally intact. No obvious neuro-muscular anomalies detected.  Sensory (Neurological): Dermatomal pain pattern         Sensory (Neurological): Dermatomal pain pattern        DTR: Patellar: deferred today Achilles: deferred today Plantar: deferred today   DTR: Patellar: deferred today Achilles: deferred today Plantar: deferred today  Palpation: No palpable anomalies   Palpation: No palpable anomalies      Assessment   Status Diagnosis  Persistent Persistent Persistent 1. SI joint arthritis (left)   2. Piriformis syndrome, left   3. Intractable neuropathic pain of left foot   4. Lumbar post-laminectomy syndrome   5. Chronic radicular lumbar pain (LEFT)   6. Pain management contract signed   7. Chronic pain syndrome        Plan of Care    [name] has a current medication list which  includes the following long-term medication(s): albuterol, azelastine-fluticasone, calcium carbonate, citalopram, fluticasone, levothyroxine, omeprazole, and potassium.  Pharmacotherapy (Medications Ordered): Meds ordered this encounter  Medications   traMADol (ULTRAM) 50 MG tablet    Sig: Take 1 tablet (50 mg total) by mouth every 8 (eight) hours as needed for severe pain. Must last 30 days    Dispense:  90 tablet    Refill:  2    Chronic Pain: STOP Act (Not applicable) Fill 1 day early if closed on refill date. Avoid benzodiazepines within 8 hours of opioids   buprenorphine (BUTRANS) 20 MCG/HR PTWK    Sig: Place 1 patch onto the skin once a week.    Dispense:  4 patch    Refill:  2    Chronic Pain: STOP Act (Not applicable) Fill 1 day early if closed on refill date. Avoid benzodiazepines within 8 hours of opioids     Follow-up plan:   Return in about 3 months (around 01/31/2021) for Medication Management, in person.    Recent Visits Date Type Provider Dept  09/05/20 Office  Visit Gillis Santa, MD [facility]  Showing recent visits within past 90 days and meeting all other requirements Today's Visits Date Type Provider Dept  10/31/20 Office Visit Gillis Santa, MD [facility]  Showing today's visits and meeting all other requirements Future Appointments No visits were found meeting these conditions. Showing future appointments within next 90 days and meeting all other requirements I discussed the assessment and treatment plan with the patient. The patient was provided an opportunity to ask questions and all were answered. The patient agreed with the plan and demonstrated an understanding of the instructions.  Patient advised to call back or seek an in-person evaluation if the symptoms or condition worsens.  Duration of encounter: 28mnutes.  Note by: BGillis Santa MD Date: 10/31/2020; Time: 12:55 PM

## 2020-10-31 NOTE — Progress Notes (Signed)
Nursing Pain Medication Assessment:  Safety precautions to be maintained throughout the outpatient stay will include: orient to surroundings, keep bed in low position, maintain call bell within reach at all times, provide assistance with transfer out of bed and ambulation.  Medication Inspection Compliance: Julie Mckinney did not comply with our request to bring her pills to be counted. She was reminded that bringing the medication bottles, even when empty, is a requirement.  Medication: None brought in. Pill/Patch Count: None available to be counted. Bottle Appearance: No container available. Did not bring bottle(s) to appointment. Filled Date: N/A Last Medication intake:  Today  Last butrans patch applied yesterday  Last Tramadol taken today

## 2020-12-05 ENCOUNTER — Telehealth (INDEPENDENT_AMBULATORY_CARE_PROVIDER_SITE_OTHER): Payer: Medicare Other | Admitting: Psychiatry

## 2020-12-05 ENCOUNTER — Encounter: Payer: Self-pay | Admitting: Psychiatry

## 2020-12-05 ENCOUNTER — Other Ambulatory Visit: Payer: Self-pay

## 2020-12-05 DIAGNOSIS — F3342 Major depressive disorder, recurrent, in full remission: Secondary | ICD-10-CM

## 2020-12-05 DIAGNOSIS — F411 Generalized anxiety disorder: Secondary | ICD-10-CM

## 2020-12-05 DIAGNOSIS — G4701 Insomnia due to medical condition: Secondary | ICD-10-CM

## 2020-12-05 NOTE — Progress Notes (Signed)
Virtual Visit via Telephone Note  I connected with Julie Mckinney on 12/05/20 at 10:00 AM EST by telephone and verified that I am speaking with the correct person using two identifiers.  Location Provider Location : [facility] Patient Location : Home  Participants: Patient , Provider    I discussed the limitations, risks, security and privacy concerns of performing an evaluation and management service by telephone and the availability of in person appointments. I also discussed with the patient that there may be a patient responsible charge related to this service. The patient expressed understanding and agreed to proceed.   I discussed the assessment and treatment plan with the patient. The patient was provided an opportunity to ask questions and all were answered. The patient agreed with the plan and demonstrated an understanding of the instructions.   The patient was advised to call back or seek an in-person evaluation if the symptoms worsen or if the condition fails to improve as anticipated.    [place] MD OP Progress Note  12/05/2020 9:56 AM Julie Mckinney  MRN:  [id number]  Chief Complaint:  Chief Complaint   Follow-up; Anxiety    HPI: Julie Mckinney is an 86 year old Caucasian female, lives in a senior living community in [place], married, has a history of MDD, GAD, COPD, chronic pain, rheumatoid arthritis, history of breast cancer in remission on tamoxifen, chronic pain was evaluated by telemedicine today.  Patient today reports she is currently struggling with flulike symptoms, has a sore throat and cough.  She agrees to follow up with her primary provider.  Otherwise she reports her mood symptoms has been stable.  Denies any significant depression or anxiety.  Continues to be compliant on the Celexa and the BuSpar.  Denies side effects.  She does have a history of chronic back pain, currently on medications for the same.  Follows up with pain management.  She has good  social support system from her husband.  Patient denies any suicidality, homicidality or perceptual disturbances.  Patient appeared to be alert, oriented to person place time and situation.  Patient denies any other concerns today.  Visit Diagnosis:    ICD-10-CM   1. MDD (major depressive disorder), recurrent, in full remission ([place])  F33.42     2. GAD (generalized anxiety disorder)  F41.1     3. Insomnia due to medical condition  G47.01    mood, pain      Past Psychiatric History: Reviewed past psychiatric history from progress note on 10/23/2017.  Past trials of Celexa, mirtazapine, melatonin  Past Medical History:  Past Medical History:  Diagnosis Date   Anxiety    Asthma    Breast cancer ([place]) Right   COPD (chronic obstructive pulmonary disease) ([place])    Depression    Personal history of radiation therapy     Past Surgical History:  Procedure Laterality Date   ABDOMINAL HYSTERECTOMY     APPENDECTOMY     BACK SURGERY     BREAST BIOPSY Right 2015   +   BREAST EXCISIONAL BIOPSY Right 1975   neg   BREAST LUMPECTOMY Right 2015   invasive lobular carcinoma   CHOLECYSTECTOMY     COLONOSCOPY     Scheduled for July 2018   COLONOSCOPY WITH PROPOFOL N/A 08/14/2016   Procedure: COLONOSCOPY WITH PROPOFOL;  Surgeon: Manya Silvas, MD;  Location[facility] ENDOSCOPY;  Service: Endoscopy;  Laterality: N/A;   ESOPHAGOGASTRODUODENOSCOPY (EGD) WITH PROPOFOL N/A 08/14/2016   Procedure: ESOPHAGOGASTRODUODENOSCOPY (EGD) WITH PROPOFOL;  Surgeon: Vira Agar,  Gavin Pound, MD;  Location: [facility] ENDOSCOPY;  Service: Endoscopy;  Laterality: N/A;    Family Psychiatric History: Reviewed family psychiatric history from progress note on 10/23/2017.  Family History:  Family History  Problem Relation Age of Onset   Cancer Father    Breast cancer Neg Hx     Social History: Reviewed social history from progress note on 10/23/2017. Social History   Socioeconomic History   Marital status: Married     Spouse name: ronald   Number of children: 2   Years of education: Not on file   Highest education level: Some college, no degree  Occupational History   Not on file  Tobacco Use   Smoking status: Former    Packs/day: 0.50    Years: 30.00    Pack years: 15.00    Types: Cigarettes    Quit date: 06/22/1995    Years since quitting: 25.4   Smokeless tobacco: Never  Vaping Use   Vaping Use: Never used  Substance and Sexual Activity   Alcohol use: Yes    Alcohol/week: 1.0 standard drink    Types: 1 Glasses of wine per week    Comment: Occasional-rare   Drug use: No   Sexual activity: Not Currently  Other Topics Concern   Not on file  Social History Narrative   Not on file   Social Determinants of Health   Financial Resource Strain: Not on file  Food Insecurity: Not on file  Transportation Needs: Not on file  Physical Activity: Not on file  Stress: Not on file  Social Connections: Not on file    Allergies:  Allergies  Allergen Reactions   Tape Rash    Metabolic Disorder Labs: No results found for: HGBA1C, MPG No results found for: PROLACTIN No results found for: CHOL, TRIG, HDL, CHOLHDL, VLDL, LDLCALC No results found for: TSH  Therapeutic Level Labs: No results found for: LITHIUM No results found for: VALPROATE No components found for:  CBMZ  Current Medications: Current Outpatient Medications  Medication Sig Dispense Refill   acetaminophen (TYLENOL) 500 MG tablet Take by mouth.     albuterol (PROVENTIL HFA;VENTOLIN HFA) 108 (90 Base) MCG/ACT inhaler Inhale 2 puffs into the lungs every 6 (six) hours as needed.      atorvastatin (LIPITOR) 40 MG tablet TAKE ONE TABLET BY MOUTH EVERY EVENING     Azelastine-Fluticasone 137-50 MCG/ACT SUSP Place 1 spray into the nose daily.      buprenorphine (BUTRANS) 20 MCG/HR PTWK Place 1 patch onto the skin once a week. 4 patch 2   busPIRone (BUSPAR) 7.5 MG tablet Take 1 tablet (7.5 mg total) by mouth 2 (two) times daily. 180  tablet 1   calcium carbonate (OS-CAL) 600 MG TABS tablet Take 600 mg by mouth 2 (two) times daily.     citalopram (CELEXA) 20 MG tablet Take 1.5 tablets (30 mg total) by mouth daily. 135 tablet 1   clobetasol cream (TEMOVATE) 0.05 % Apply topically.     doxycycline (VIBRA-TABS) 100 MG tablet Take 100 mg by mouth 2 (two) times daily.     fluticasone (FLONASE) 50 MCG/ACT nasal spray Place 1 spray into the nose as needed.     Fluticasone-Umeclidin-Vilant (TRELEGY ELLIPTA) 100-62.5-25 MCG/INH AEPB Inhale 1 puff into the lungs daily.     GLUCOSAMINE SULFATE PO Take 1 capsule by mouth 2 (two) times daily.     levothyroxine (SYNTHROID, LEVOTHROID) 50 MCG tablet Take 50 mcg by mouth daily.     Multiple  Vitamin (MULTI-VITAMINS) TABS Take 1 tablet by mouth daily.     Nebulizers MISC Inhale 1 Dose into the lungs as needed.      omeprazole (PRILOSEC) 20 MG capsule Take 20 mg by mouth daily.     PATADAY 0.2 % SOLN Apply 1 drop to eye as needed.      Potassium 99 MG TABS Take 1 tablet by mouth daily.      predniSONE (DELTASONE) 10 MG tablet Take 10 mg by mouth 2 (two) times daily.     traMADol (ULTRAM) 50 MG tablet Take 1 tablet (50 mg total) by mouth every 8 (eight) hours as needed for severe pain. Must last 30 days 90 tablet 2   vitamin B-12 (CYANOCOBALAMIN) 1000 MCG tablet Take 1,000 mcg by mouth daily.     No current facility-administered medications for this visit.     Musculoskeletal: Strength & Muscle Tone:  UTA Gait & Station:  UTA Patient leans: N/A  Psychiatric Specialty Exam: Review of Systems  HENT:  Positive for sore throat.   Respiratory:  Positive for cough.   Musculoskeletal:  Positive for back pain.  Psychiatric/Behavioral:  Negative for agitation, behavioral problems, confusion, decreased concentration, dysphoric mood, hallucinations, self-injury, sleep disturbance and suicidal ideas. The patient is not nervous/anxious and is not hyperactive.   All other systems reviewed and are  negative.  There were no vitals taken for this visit.There is no height or weight on file to calculate BMI.  General Appearance: Casual  Eye Contact:   UTA  Speech:  Clear and Coherent  Volume:  Normal  Mood:  Euthymic  Affect:   UTA  Thought Process:  Goal Directed and Descriptions of Associations: Intact  Orientation:  Full (Time, Place, and Person)  Thought Content: Logical   Suicidal Thoughts:  No  Homicidal Thoughts:  No  Memory:  Immediate;   Fair Recent;   Fair Remote;   Limited  Judgement:  Fair  Insight:  Fair  Psychomotor Activity:   UTA  Concentration:  Concentration: Fair and Attention Span: Fair  Recall:  [employer] of Knowledge: Fair  Language: Fair  Akathisia:  No  Handed:  Right  AIMS (if indicated): not done  Assets:  Communication Skills Desire for Improvement Housing Social Support  ADL's:  Intact  Cognition: WNL  Sleep:  Fair   Screenings: GAD-7    Flowsheet Row Video Visit from 05/11/2020 in [place]  Total GAD-7 Score 4      PHQ2-9    [place] Visit from 09/05/2020 in [place] Video Visit from 08/07/2020 in [place] Procedure visit from 07/10/2020 in [place] Video Visit from 05/11/2020 in [place] Office Visit from 12/06/2019 in [place]  PHQ-2 Total Score 0 0 0 0 0        Assessment and Plan: Julie Mckinney is an 86 year old female, married, lives in an independent senior living community in [place], has a history of MDD, GAD, sleep problems, chronic pain was evaluated by telemedicine today.  Patient is currently stable on the current medication regimen.  Plan as noted below.  Plan MDD in full remission Celexa 30 mg p.o. daily  GAD-stable BuSpar 7.5 mg p.o. twice daily Celexa 30 mg p.o.  daily Hydroxyzine 25-50 mg p.o. daily as needed  Insomnia-stable We will monitor closely  Patient will continue to need sufficient pain  management.  Patient to follow up with primary provider for her flulike symptoms.   I have spent at least 18 minutes non face to face with patient today.  This note was generated in part or whole with voice recognition software. Voice recognition is usually quite accurate but there are transcription errors that can and very often do occur. I apologize for any typographical errors that were not detected and corrected.       Ursula Alert, MD 12/05/2020, 9:56 AM

## 2021-01-30 ENCOUNTER — Encounter: Payer: Self-pay | Admitting: Student in an Organized Health Care Education/Training Program

## 2021-01-30 ENCOUNTER — Ambulatory Visit
Payer: Medicare Other | Attending: Student in an Organized Health Care Education/Training Program | Admitting: Student in an Organized Health Care Education/Training Program

## 2021-01-30 ENCOUNTER — Other Ambulatory Visit: Payer: Self-pay

## 2021-01-30 VITALS — BP 140/77 | HR 85 | Temp 96.9°F | Resp 16 | Ht 61.0 in | Wt 100.0 lb

## 2021-01-30 DIAGNOSIS — G5702 Lesion of sciatic nerve, left lower limb: Secondary | ICD-10-CM | POA: Insufficient documentation

## 2021-01-30 DIAGNOSIS — M5416 Radiculopathy, lumbar region: Secondary | ICD-10-CM | POA: Diagnosis present

## 2021-01-30 DIAGNOSIS — M961 Postlaminectomy syndrome, not elsewhere classified: Secondary | ICD-10-CM | POA: Insufficient documentation

## 2021-01-30 DIAGNOSIS — G894 Chronic pain syndrome: Secondary | ICD-10-CM | POA: Insufficient documentation

## 2021-01-30 DIAGNOSIS — M792 Neuralgia and neuritis, unspecified: Secondary | ICD-10-CM | POA: Insufficient documentation

## 2021-01-30 DIAGNOSIS — Z0289 Encounter for other administrative examinations: Secondary | ICD-10-CM | POA: Diagnosis present

## 2021-01-30 DIAGNOSIS — C50911 Malignant neoplasm of unspecified site of right female breast: Secondary | ICD-10-CM

## 2021-01-30 DIAGNOSIS — Z170 Estrogen receptor positive status [ER+]: Secondary | ICD-10-CM

## 2021-01-30 DIAGNOSIS — G8929 Other chronic pain: Secondary | ICD-10-CM | POA: Insufficient documentation

## 2021-01-30 DIAGNOSIS — M47818 Spondylosis without myelopathy or radiculopathy, sacral and sacrococcygeal region: Secondary | ICD-10-CM

## 2021-01-30 MED ORDER — TRAMADOL HCL 50 MG PO TABS: 50.0000 mg | ORAL_TABLET | Freq: Three times a day (TID) | ORAL | 2 refills | Status: AC | PRN

## 2021-01-30 MED ORDER — BUPRENORPHINE 20 MCG/HR TD PTWK: 1.0000 | MEDICATED_PATCH | TRANSDERMAL | 2 refills | Status: AC

## 2021-01-30 NOTE — Progress Notes (Signed)
PROVIDER NOTE: Information contained herein reflects review and annotations entered in association with encounter. Interpretation of such information and data should be left to medically-trained personnel. Information provided to patient can be located elsewhere in the medical record under "Patient Instructions". Document created using STT-dictation technology, any transcriptional errors that may result from process are unintentional.    Patient: Julie Mckinney  Service Category: E/M  Provider: Gillis Santa, MD  DOB: 1934/01/28  DOS: 01/30/2021  Specialty: Interventional Pain Management  MRN: [id number]  Setting: Ambulatory outpatient  PCP: Dion Body, MD  Type: Established Patient    Referring Provider: Dion Body, MD  Location: Office  Delivery: Face-to-face     HPI  Ms. Julieth Tugman, a 87 y.o. year old female, is here today because of her SI joint arthritis [M47.818]. Ms. Rishel primary complain today is Back Pain (Left buttock)  Last encounter: My last encounter with her was on 10/31/20 Pertinent problems: Ms. Kingdon has History of CVA (cerebrovascular accident) without residual deficits; MDD (major depressive disorder), recurrent episode, mild ([place]); T10 vertebral fracture ([place]); Closed stable burst fracture of T12 vertebra ([place]); Lumbar post-laminectomy syndrome; MDD (major depressive disorder), recurrent, in full remission ([place]); Pain management contract signed; and Chronic pain syndrome on their pertinent problem list. Pain Assessment: Severity of Chronic pain is reported as a 9 /10. Location: Back Lower, Left/radiates to ankle. Onset: More than a month ago. Quality: Aching. Timing: Intermittent. Modifying factor(s): rest. Vitals:  height is '5\' 1"'  (1.549 m) and weight is 100 lb (45.4 kg). Her temperature is 96.9 F (36.1 C) (abnormal). Her blood pressure is 140/77 and her pulse is 85. Her respiration is 16 and oxygen saturation is 98%.   Reason for encounter:  medication management.   No change in medical history since last visit.  Patient's pain is at baseline.  Patient continues multimodal pain regimen as prescribed.  States that it provides pain relief and improvement in functional status. Patient had custom brace made for her right foot to minimize foot eversion which she states has helped with her ability to walk and minimized her risk of falls.  She feels more confident walking now that she has a brace in place.  Pharmacotherapy Assessment  Analgesic: Butrans patch 29mg/hr, continue tramadol 50 mg every 8 hours as needed    Monitoring: [place] PMP: PDMP reviewed during this encounter.       Pharmacotherapy: No side-effects or adverse reactions reported. Compliance: No problems identified. Effectiveness: Clinically acceptable.  TDewayne Shorter RN  01/30/2021 10:59 AM  Sign when Signing Visit Nursing Pain Medication Assessment:  Safety precautions to be maintained throughout the outpatient stay will include: orient to surroundings, keep bed in low position, maintain call bell within reach at all times, provide assistance with transfer out of bed and ambulation.  Medication Inspection Compliance: Ms. RLorgedid not comply with our request to bring her pills to be counted. She was reminded that bringing the medication bottles, even when empty, is a requirement.  Medication: None brought in. Pill/Patch Count: None available to be counted. Bottle Appearance: No container available. Did not bring bottle(s) to appointment. Filled Date: N/A Last Medication intake:  Today   UDS:  No results found for: SUMMARY   ROS  Constitutional: Denies any fever or chills Gastrointestinal: No reported hemesis, hematochezia, vomiting, or acute GI distress Musculoskeletal:  Low back, bilateral hip, bilateral buttock pain left greater than right Neurological: No reported episodes of acute onset apraxia, aphasia, dysarthria, agnosia, amnesia, paralysis, loss of  coordination, or loss of consciousness  Medication Review  Azelastine-Fluticasone, Fluticasone-Umeclidin-Vilant, Glucosamine Sulfate, Multi-Vitamins, Nebulizers, Olopatadine HCl, Potassium, acetaminophen, albuterol, atorvastatin, buprenorphine, busPIRone, calcium carbonate, citalopram, clobetasol cream, levothyroxine, lidocaine-prilocaine, omeprazole, predniSONE, tolterodine, traMADol, and vitamin B-12  History Review  Allergy: Ms. Hofland is allergic to tape. Drug: Ms. Mothershead  reports no history of drug use. Alcohol:  reports current alcohol use of about 1.0 standard drink per week. Tobacco:  reports that she quit smoking about 25 years ago. Her smoking use included cigarettes. She has a 15.00 pack-year smoking history. She has never used smokeless tobacco. Social: Ms. Nason  reports that she quit smoking about 25 years ago. Her smoking use included cigarettes. She has a 15.00 pack-year smoking history. She has never used smokeless tobacco. She reports current alcohol use of about 1.0 standard drink per week. She reports that she does not use drugs. Medical:  has a past medical history of Anxiety, Asthma, Breast cancer ([place]) (Right), COPD (chronic obstructive pulmonary disease) ([place]), Depression, and Personal history of radiation therapy. Surgical: Ms. Casavant  has a past surgical history that includes Appendectomy; Colonoscopy; Cholecystectomy; Abdominal hysterectomy; Esophagogastroduodenoscopy (egd) with propofol (N/A, 08/14/2016); Colonoscopy with propofol (N/A, 08/14/2016); Back surgery; Breast biopsy (Right, 2015); Breast excisional biopsy (Right, 1975); and Breast lumpectomy (Right, 2015). Family: family history includes Cancer in her father.  Laboratory Chemistry Profile   Renal Lab Results  Component Value Date   BUN 27 (H) 01/19/2019   CREATININE 0.56 01/19/2019   GFRAA >60 01/19/2019   GFRNONAA >60 01/19/2019    Hepatic Lab Results  Component Value Date   AST 20 01/17/2019    ALT 18 01/17/2019   ALBUMIN 3.5 01/17/2019   ALKPHOS 34 (L) 01/17/2019    Electrolytes Lab Results  Component Value Date   NA 142 01/19/2019   K 4.0 01/19/2019   CL 111 01/19/2019   CALCIUM 8.5 (L) 01/19/2019    Bone No results found for: VD25OH, VD125OH2TOT, AP0141CV0, DT1438OI7, 25OHVITD1, 25OHVITD2, 25OHVITD3, TESTOFREE, TESTOSTERONE  Inflammation (CRP: Acute Phase) (ESR: Chronic Phase) No results found for: CRP, ESRSEDRATE, LATICACIDVEN       Note: Above Lab results reviewed.   Physical Exam  General appearance: Well nourished, well developed, and well hydrated. In no apparent acute distress Mental status: Alert, oriented x 3 (person, place, & time)       Respiratory: No evidence of acute respiratory distress Eyes: PERLA Vitals: BP 140/77    Pulse 85    Temp (!) 96.9 F (36.1 C)    Resp 16    Ht '5\' 1"'  (1.549 m)    Wt 100 lb (45.4 kg)    SpO2 98%    BMI 18.89 kg/m  BMI: Estimated body mass index is 18.89 kg/m as calculated from the following:   Height as of this encounter: '5\' 1"'  (1.549 m).   Weight as of this encounter: 100 lb (45.4 kg). Ideal: Female patients must weigh at least 45.5 kg to calculate ideal body weight    Lumbar Exam  Skin & Axial Inspection: Thoraco-lumbar Scoliosis Alignment: Scoliosis detected Functional ROM: Pain restricted ROM affecting both sides Stability: No instability detected Muscle Tone/Strength: Functionally intact. No obvious neuro-muscular anomalies detected. Sensory (Neurological): Dermatomal pain pattern bilaterally Palpation: Tender to palpation       Provocative Tests: Hyperextension/rotation test: deferred today       Lumbar quadrant test (Kemp's test): (+) due to pain. Lateral bending test: deferred today       Patrick's Maneuver: deferred today  FABER* test: deferred today                   S-I anterior distraction/compression test: deferred today         S-I lateral compression test: deferred today          S-I Thigh-thrust test: deferred today         S-I Gaenslen's test: deferred today         *(Flexion, ABduction and External Rotation)   Gait & Posture Assessment  Ambulation: Limited Gait: Antalgic Posture: Difficulty standing up straight, due to pain    Lower Extremity Exam      Side: Right lower extremity   Side: Left lower extremity  Stability: No instability observed           Stability: No instability observed          Skin & Extremity Inspection: Skin color, temperature, and hair growth are WNL. No peripheral edema or cyanosis. No masses, redness, swelling, asymmetry, or associated skin lesions. No contractures.   Skin & Extremity Inspection: Skin color, temperature, and hair growth are WNL. No peripheral edema or cyanosis. No masses, redness, swelling, asymmetry, or associated skin lesions. No contractures.  Functional ROM: Pain restricted ROM for all joints of the lower extremity           Functional ROM: Pain restricted ROM for all joints of the lower extremity          Muscle Tone/Strength: Functionally intact. No obvious neuro-muscular anomalies detected.   Muscle Tone/Strength: Functionally intact. No obvious neuro-muscular anomalies detected.  Sensory (Neurological): Dermatomal pain pattern         Sensory (Neurological): Dermatomal pain pattern        DTR: Patellar: deferred today Achilles: deferred today Plantar: deferred today   DTR: Patellar: deferred today Achilles: deferred today Plantar: deferred today  Palpation: No palpable anomalies   Palpation: No palpable anomalies      Assessment   Status Diagnosis  Persistent Persistent Persistent 1. SI joint arthritis (left)   2. Piriformis syndrome, left   3. Intractable neuropathic pain of left foot   4. Chronic radicular lumbar pain (LEFT)   5. Pain management contract signed   6. Malignant neoplasm of right breast in female, estrogen receptor positive, unspecified site of breast ([place])   7. Lumbar  post-laminectomy syndrome   8. Chronic pain syndrome        Plan of Care    Ms. Narjis Mira has a current medication list which includes the following long-term medication(s): albuterol, azelastine-fluticasone, calcium carbonate, citalopram, levothyroxine, omeprazole, and potassium.  Pharmacotherapy (Medications Ordered): Meds ordered this encounter  Medications   traMADol (ULTRAM) 50 MG tablet    Sig: Take 1 tablet (50 mg total) by mouth every 8 (eight) hours as needed for severe pain. Must last 30 days    Dispense:  90 tablet    Refill:  2    Chronic Pain: STOP Act (Not applicable) Fill 1 day early if closed on refill date. Avoid benzodiazepines within 8 hours of opioids   buprenorphine (BUTRANS) 20 MCG/HR PTWK    Sig: Place 1 patch onto the skin once a week.    Dispense:  4 patch    Refill:  2     Follow-up plan:   Return in about 12 weeks (around 04/24/2021) for Medication Management, in person.    Recent Visits No visits were found meeting these conditions. Showing recent visits within past 90 days and  meeting all other requirements Today's Visits Date Type Provider Dept  01/30/21 Office Visit Gillis Santa, MD [facility]  Showing today's visits and meeting all other requirements Future Appointments Date Type Provider Dept  04/17/21 Appointment Gillis Santa, MD [facility]  Showing future appointments within next 90 days and meeting all other requirements  I discussed the assessment and treatment plan with the patient. The patient was provided an opportunity to ask questions and all were answered. The patient agreed with the plan and demonstrated an understanding of the instructions.  Patient advised to call back or seek an in-person evaluation if the symptoms or condition worsens.  Duration of encounter: 29mnutes.  Note by: BGillis Santa MD Date: 01/30/2021; Time: 11:41 AM

## 2021-01-30 NOTE — Progress Notes (Signed)
Nursing Pain Medication Assessment:  Safety precautions to be maintained throughout the outpatient stay will include: orient to surroundings, keep bed in low position, maintain call bell within reach at all times, provide assistance with transfer out of bed and ambulation.  Medication Inspection Compliance: Julie Mckinney did not comply with our request to bring her pills to be counted. She was reminded that bringing the medication bottles, even when empty, is a requirement.  Medication: None brought in. Pill/Patch Count: None available to be counted. Bottle Appearance: No container available. Did not bring bottle(s) to appointment. Filled Date: N/A Last Medication intake:  Today

## 2021-03-06 ENCOUNTER — Other Ambulatory Visit: Payer: Self-pay

## 2021-03-06 ENCOUNTER — Ambulatory Visit (INDEPENDENT_AMBULATORY_CARE_PROVIDER_SITE_OTHER): Payer: Medicare Other | Admitting: Psychiatry

## 2021-03-06 ENCOUNTER — Encounter: Payer: Self-pay | Admitting: Psychiatry

## 2021-03-06 VITALS — BP 166/81 | HR 94 | Temp 98.5°F | Wt 103.4 lb

## 2021-03-06 DIAGNOSIS — F411 Generalized anxiety disorder: Secondary | ICD-10-CM

## 2021-03-06 DIAGNOSIS — Z79899 Other long term (current) drug therapy: Secondary | ICD-10-CM | POA: Insufficient documentation

## 2021-03-06 DIAGNOSIS — F3342 Major depressive disorder, recurrent, in full remission: Secondary | ICD-10-CM

## 2021-03-06 DIAGNOSIS — G4701 Insomnia due to medical condition: Secondary | ICD-10-CM | POA: Diagnosis not present

## 2021-03-06 MED ORDER — CITALOPRAM HYDROBROMIDE 20 MG PO TABS: 30.0000 mg | ORAL_TABLET | Freq: Every day | ORAL | 3 refills | Status: DC

## 2021-03-06 MED ORDER — BUSPIRONE HCL 7.5 MG PO TABS: 7.5000 mg | ORAL_TABLET | Freq: Two times a day (BID) | ORAL | 3 refills | Status: DC

## 2021-03-06 NOTE — Progress Notes (Signed)
[facility] MD OP Progress Note  03/06/2021 11:17 AM Julie Mckinney  MRN:  [id number]  Chief Complaint:  Chief Complaint  Patient presents with   Follow-up 87 year old Caucasian female, lives in a senior living community in [place], married, has a history of MDD, GAD, COPD, chronic pain was evaluated for medication management.   HPI: Julie Mckinney is a 87 year old Caucasian female, lives in a senior living community in [place], married, has a history of MDD, GAD, COPD, chronic pain, rheumatoid arthritis, history of breast cancer in remission on tamoxifen, chronic pain was evaluated in office today.  Patient today reports she looks forward to the Valentine's Day dinner this evening at the senior living community.  She and her husband have been together for the past 65 years.  Patient reports he continues to be very supportive.  There are times when she feels bad that he has to help her and the fact that she needs to depend on him.  Patient reports she is however coping okay and her medical problems including her chronic pain has been stable.  Continues follow-up with Dr. Holley Raring for pain management.  Denies any significant sadness or anxiety symptoms.  Reports sleep is good.  Gets around 9 hours of sleep at night.  Currently compliant on the Celexa, BuSpar.  Denies any suicidality, homicidality or perceptual disturbances.  Denies any other concerns today.  Visit Diagnosis:    ICD-10-CM   1. MDD (major depressive disorder), recurrent, in full remission ([place])  F33.42 citalopram (CELEXA) 20 MG tablet    Sodium    2. GAD (generalized anxiety disorder)  F41.1 citalopram (CELEXA) 20 MG tablet    busPIRone (BUSPAR) 7.5 MG tablet    Sodium    3. Insomnia due to medical condition  G47.01    mood, pain    4. High risk medication use  Z79.899 Sodium      Past Psychiatric History: Reviewed past psychiatric history from progress note on 10/23/2017.  Past trials of Celexa, mirtazapine,  melatonin.  Past Medical History:  Past Medical History:  Diagnosis Date   Anxiety    Asthma    Breast cancer ([place]) Right   COPD (chronic obstructive pulmonary disease) ([place])    Depression    Personal history of radiation therapy     Past Surgical History:  Procedure Laterality Date   ABDOMINAL HYSTERECTOMY     APPENDECTOMY     BACK SURGERY     BREAST BIOPSY Right 2015   +   BREAST EXCISIONAL BIOPSY Right 1975   neg   BREAST LUMPECTOMY Right 2015   invasive lobular carcinoma   CHOLECYSTECTOMY     COLONOSCOPY     Scheduled for July 2018   COLONOSCOPY WITH PROPOFOL N/A 08/14/2016   Procedure: COLONOSCOPY WITH PROPOFOL;  Surgeon: Manya Silvas, MD;  Location: [facility] ENDOSCOPY;  Service: Endoscopy;  Laterality: N/A;   ESOPHAGOGASTRODUODENOSCOPY (EGD) WITH PROPOFOL N/A 08/14/2016   Procedure: ESOPHAGOGASTRODUODENOSCOPY (EGD) WITH PROPOFOL;  Surgeon: Manya Silvas, MD;  Location: [facility] ENDOSCOPY;  Service: Endoscopy;  Laterality: N/A;    Family Psychiatric History: Reviewed family psychiatric history from progress note on 10/23/2017.  Family History:  Family History  Problem Relation Age of Onset   Cancer Father    Breast cancer Neg Hx     Social History: Reviewed social history from progress note on 10/23/2017. Social History   Socioeconomic History   Marital status: Married    Spouse name: ronald   Number of children: 2  Years of education: Not on file   Highest education level: Some college, no degree  Occupational History   Not on file  Tobacco Use   Smoking status: Former    Packs/day: 0.50    Years: 30.00    Pack years: 15.00    Types: Cigarettes    Quit date: 06/22/1995    Years since quitting: 25.7   Smokeless tobacco: Never  Vaping Use   Vaping Use: Never used  Substance and Sexual Activity   Alcohol use: Yes    Alcohol/week: 1.0 standard drink    Types: 1 Glasses of wine per week    Comment: Occasional-rare   Drug use: No   Sexual activity: Not  Currently  Other Topics Concern   Not on file  Social History Narrative   Not on file   Social Determinants of Health   Financial Resource Strain: Not on file  Food Insecurity: Not on file  Transportation Needs: Not on file  Physical Activity: Not on file  Stress: Not on file  Social Connections: Not on file    Allergies:  Allergies  Allergen Reactions   Tape Rash    Metabolic Disorder Labs: No results found for: HGBA1C, MPG No results found for: PROLACTIN No results found for: CHOL, TRIG, HDL, CHOLHDL, VLDL, LDLCALC No results found for: TSH  Therapeutic Level Labs: No results found for: LITHIUM No results found for: VALPROATE No components found for:  CBMZ  Current Medications: Current Outpatient Medications  Medication Sig Dispense Refill   acetaminophen (TYLENOL) 500 MG tablet Take by mouth.     albuterol (PROVENTIL HFA;VENTOLIN HFA) 108 (90 Base) MCG/ACT inhaler Inhale 2 puffs into the lungs every 6 (six) hours as needed.      atorvastatin (LIPITOR) 40 MG tablet TAKE ONE TABLET BY MOUTH EVERY EVENING     Azelastine-Fluticasone 137-50 MCG/ACT SUSP Place 1 spray into the nose daily.      buprenorphine (BUTRANS) 20 MCG/HR PTWK Place 1 patch onto the skin once a week. 4 patch 2   calcium carbonate (OS-CAL) 600 MG TABS tablet Take 600 mg by mouth 2 (two) times daily.     clobetasol cream (TEMOVATE) 0.05 % Apply topically.     Fluticasone-Umeclidin-Vilant (TRELEGY ELLIPTA) 100-62.5-25 MCG/INH AEPB Inhale 1 puff into the lungs daily.     GLUCOSAMINE SULFATE PO Take 1 capsule by mouth 2 (two) times daily.     levothyroxine (SYNTHROID, LEVOTHROID) 50 MCG tablet Take 50 mcg by mouth daily.     lidocaine-prilocaine (EMLA) cream Apply topically.     Multiple Vitamin (MULTI-VITAMINS) TABS Take 1 tablet by mouth daily.     Nebulizers MISC Inhale 1 Dose into the lungs as needed.      omeprazole (PRILOSEC) 20 MG capsule Take 20 mg by mouth daily.     PATADAY 0.2 % SOLN Apply 1  drop to eye as needed.      Potassium 99 MG TABS Take 1 tablet by mouth daily.      predniSONE (DELTASONE) 10 MG tablet Take 10 mg by mouth 2 (two) times daily.     tolterodine (DETROL LA) 2 MG 24 hr capsule Take 2 mg by mouth daily.     traMADol (ULTRAM) 50 MG tablet Take 1 tablet (50 mg total) by mouth every 8 (eight) hours as needed for severe pain. Must last 30 days 90 tablet 2   vitamin B-12 (CYANOCOBALAMIN) 1000 MCG tablet Take 1,000 mcg by mouth daily.     busPIRone (  BUSPAR) 7.5 MG tablet Take 1 tablet (7.5 mg total) by mouth 2 (two) times daily. 180 tablet 3   citalopram (CELEXA) 20 MG tablet Take 1.5 tablets (30 mg total) by mouth daily. 135 tablet 3   No current facility-administered medications for this visit.     Musculoskeletal: Strength & Muscle Tone:  walks with walker - limited Gait & Station:  walker needed Patient leans: Front  Psychiatric Specialty Exam: Review of Systems  Musculoskeletal:  Positive for back pain (chronic).  Psychiatric/Behavioral:  Negative for agitation, behavioral problems, confusion, decreased concentration, dysphoric mood, hallucinations, self-injury, sleep disturbance and suicidal ideas. The patient is not nervous/anxious and is not hyperactive.   All other systems reviewed and are negative.  Blood pressure (!) 166/81, pulse 94, temperature 98.5 F (36.9 C), temperature source Temporal, weight 103 lb 6.4 oz (46.9 kg).Body mass index is 19.54 kg/m.  General Appearance: Casual  Eye Contact:  Fair  Speech:  Clear and Coherent  Volume:  Normal  Mood:  Euthymic  Affect:  Congruent  Thought Process:  Goal Directed and Descriptions of Associations: Intact  Orientation:  Full (Time, Place, and Person)  Thought Content: Logical   Suicidal Thoughts:  No  Homicidal Thoughts:  No  Memory:  Immediate;   Fair Recent;   Fair Remote;   Limited  Judgement:  Fair  Insight:  Fair  Psychomotor Activity:  Normal  Concentration:  Concentration: Fair and  Attention Span: Fair  Recall:  [employer] of Knowledge: Fair  Language: Fair  Akathisia:  No  Handed:  Right  AIMS (if indicated): done, 0  Assets:  Communication Skills Desire for Improvement Housing Intimacy Social Support  ADL's:  Intact  Cognition: WNL  Sleep:  Fair   Screenings: GAD-7    Flowsheet Row Video Visit from 05/11/2020 in [place]  Total GAD-7 Score 4      PHQ2-9    [place] Visit from 03/06/2021 in [facility] Office Visit from 01/30/2021 in [facility] Office Visit from 09/05/2020 in [facility] [place] Video Visit from 08/07/2020 in [facility] Procedure visit from 07/10/2020 in [facility] [place]  PHQ-2 Total Score 2 0 0 0 0  PHQ-9 Total Score 4 -- -- -- --      [place] Office Visit from 03/06/2021 in [facility] No Risk        Assessment and Plan: Julie Mckinney is a 87 year old female, married, lives in an independent senior living community in [place], has a history of MDD, GAD, chronic pain, insomnia was evaluated in office today.  Patient is currently stable.  Plan MDD in full remission Celexa 30 mg p.o. daily  GAD-stable BuSpar 7.5 mg p.o. twice daily. Celexa 30 mg p.o. daily Hydroxyzine 25-50 mg p.o. daily as needed  Insomnia-stable Continue sleep hygiene techniques. Continue sufficient pain management.   Patient with elevated blood pressure reading-advised to follow up with primary care provider.  Follow-up in clinic in 6 months or sooner if needed.  This note was generated in part or whole with voice recognition software. Voice recognition is usually quite accurate but there are transcription errors that can and very often do occur. I apologize for any  typographical errors that were not detected and corrected.     Ursula Alert, MD 03/06/2021, 12:55 PM

## 2021-03-07 ENCOUNTER — Other Ambulatory Visit
Admission: RE | Admit: 2021-03-07 | Discharge: 2021-03-07 | Disposition: A | Discharge status: Home / Self Care | Payer: Medicare Other | Attending: Psychiatry | Admitting: Psychiatry

## 2021-03-07 DIAGNOSIS — F3342 Major depressive disorder, recurrent, in full remission: Secondary | ICD-10-CM | POA: Diagnosis present

## 2021-03-07 DIAGNOSIS — F411 Generalized anxiety disorder: Secondary | ICD-10-CM | POA: Insufficient documentation

## 2021-03-07 DIAGNOSIS — Z79899 Other long term (current) drug therapy: Secondary | ICD-10-CM | POA: Diagnosis not present

## 2021-03-07 LAB — SODIUM: Sodium: 138 mmol/L (ref 135–145)

## 2021-04-17 ENCOUNTER — Encounter: Payer: Medicare Other | Admitting: Student in an Organized Health Care Education/Training Program

## 2021-05-17 ENCOUNTER — Ambulatory Visit
Payer: Medicare Other | Attending: Student in an Organized Health Care Education/Training Program | Admitting: Student in an Organized Health Care Education/Training Program

## 2021-05-17 ENCOUNTER — Encounter: Payer: Self-pay | Admitting: Student in an Organized Health Care Education/Training Program

## 2021-05-17 DIAGNOSIS — M47818 Spondylosis without myelopathy or radiculopathy, sacral and sacrococcygeal region: Secondary | ICD-10-CM | POA: Diagnosis not present

## 2021-05-17 DIAGNOSIS — G8929 Other chronic pain: Secondary | ICD-10-CM

## 2021-05-17 DIAGNOSIS — M5416 Radiculopathy, lumbar region: Secondary | ICD-10-CM

## 2021-05-17 DIAGNOSIS — G5702 Lesion of sciatic nerve, left lower limb: Secondary | ICD-10-CM

## 2021-05-17 DIAGNOSIS — Z170 Estrogen receptor positive status [ER+]: Secondary | ICD-10-CM

## 2021-05-17 DIAGNOSIS — C50911 Malignant neoplasm of unspecified site of right female breast: Secondary | ICD-10-CM

## 2021-05-17 DIAGNOSIS — M792 Neuralgia and neuritis, unspecified: Secondary | ICD-10-CM | POA: Diagnosis not present

## 2021-05-17 DIAGNOSIS — M961 Postlaminectomy syndrome, not elsewhere classified: Secondary | ICD-10-CM

## 2021-05-17 DIAGNOSIS — Z0289 Encounter for other administrative examinations: Secondary | ICD-10-CM

## 2021-05-17 MED ORDER — BUPRENORPHINE 20 MCG/HR TD PTWK: 1.0000 | MEDICATED_PATCH | TRANSDERMAL | 0 refills | Status: DC

## 2021-05-17 MED ORDER — TRAMADOL HCL 50 MG PO TABS: 50.0000 mg | ORAL_TABLET | Freq: Three times a day (TID) | ORAL | 0 refills | Status: DC | PRN

## 2021-05-17 NOTE — Progress Notes (Signed)
Patient: Julie Mckinney  Service Category: E/M  Provider:  , MD  ?DOB: 08/05/1934  DOS: 05/17/2021  Location: Office  ?MRN: [id number]  Setting: Ambulatory outpatient  Referring Provider: Kalisetti, Radhika, MD  ?Type: Established Patient  Specialty: Interventional Pain Management  PCP: Kalisetti, Radhika, MD  ?Location: Home  Delivery: TeleHealth    ? ?Virtual Encounter - Pain Management ?PROVIDER NOTE: Information contained herein reflects review and annotations entered in association with encounter. Interpretation of such information and data should be left to medically-trained personnel. Information provided to patient can be located elsewhere in the medical record under "Patient Instructions". Document created using STT-dictation technology, any transcriptional errors that may result from process are unintentional.  ?  ?Contact & Pharmacy ?Preferred: [phone] ?Home: [phone] (home) ?Mobile: [phone] (mobile) ?E-mail: [email or web address]  ?[facility] - [place], [place] - [address] ?[address] ?[address] ?[place] [address] ?Phone: [phone] Fax: [phone] ?  ?Pre-screening  ?Ms. Hoogendoorn offered "in-person" vs "virtual" encounter. She indicated preferring virtual for this encounter.  ? ?Reason ?COVID-19*  Social distancing based on CDC and AMA recommendations.  ? ?[name] on 05/17/2021 via video conference.      I clearly identified myself as  , MD. I verified that I was speaking with the correct person using two identifiers (Name: Julie Mckinney, and date of birth: 08/17/1934). ? ?Consent ?I sought verbal advanced consent from Julie Mckinney for virtual visit interactions. I informed Julie Mckinney of possible security and privacy concerns, risks, and limitations associated with providing "not-in-person" medical evaluation and management services. I also informed Julie Mckinney of the availability of "in-person"  appointments. Finally, I informed her that there would be a charge for the virtual visit and that she could be  personally, fully or partially, financially responsible for it. Julie Mckinney expressed understanding and agreed to proceed.  ? ?Historic Elements   ?Julie Mckinney is a 86 y.o. year old, female patient evaluated today after our last contact on 01/30/21. Julie Mckinney  has a past medical history of Anxiety, Asthma, Breast cancer (HCC) (Right), COPD (chronic obstructive pulmonary disease) (HCC), Depression, and Personal history of radiation therapy. She also  has a past surgical history that includes Appendectomy; Colonoscopy; Cholecystectomy; Abdominal hysterectomy; Esophagogastroduodenoscopy (egd) with propofol (N/A, 08/14/2016); Colonoscopy with propofol (N/A, 08/14/2016); Back surgery; Breast biopsy (Right, 2015); Breast excisional biopsy (Right, 1975); and Breast lumpectomy (Right, 2015). Julie Mckinney has a current medication list which includes the following prescription(s): buprenorphine, tramadol, acetaminophen, albuterol, atorvastatin, azelastine-fluticasone, buspirone, calcium carbonate, citalopram, clobetasol cream, trelegy ellipta, glucosamine sulfate, levothyroxine, lidocaine-prilocaine, multi-vitamins, nebulizers, omeprazole, pataday, potassium, prednisone, tolterodine, and vitamin b-12. She  reports that she quit smoking about 25 years ago. Her smoking use included cigarettes. She has a 15.00 pack-year smoking history. She has never used smokeless tobacco. She reports current alcohol use of about 1.0 standard drink per week. She reports that she does not use drugs. Julie Mckinney is allergic to tape.  ? ?HPI  ?Today, she is being contacted for medication management. ? ?Patient was supposed to come in today for medication management however she has severe diarrhea which started this morning.  She states that she did have a dairy dessert last night which could be a culprit.  Otherwise she needs  a refill of her Butrans patch and tramadol.  No change in dose. ? ?Pharmacotherapy Assessment  ?Analgesic: Butrans patch 20mcg/hr, continue tramadol 50 mg TID prn   ? ?Monitoring: ?Julie Mckinney: PDMP reviewed during this encounter.       ?  Pharmacotherapy: No side-effects or adverse reactions reported. ?Compliance: No problems identified. ?Effectiveness: Clinically acceptable. ?Plan: Refer to "POC". ? ?UDS: No results found for: SUMMARY ? ?Laboratory Chemistry Profile  ? ?Renal ?Lab Results  ?Component Value Date  ? BUN 27 (H) 01/19/2019  ? CREATININE 0.56 01/19/2019  ? GFRAA >60 01/19/2019  ? GFRNONAA >60 01/19/2019  ? ?  Hepatic ?Lab Results  ?Component Value Date  ? AST 20 01/17/2019  ? ALT 18 01/17/2019  ? ALBUMIN 3.5 01/17/2019  ? ALKPHOS 34 (L) 01/17/2019  ? ?  ?Electrolytes ?Lab Results  ?Component Value Date  ? NA 138 03/07/2021  ? K 4.0 01/19/2019  ? CL 111 01/19/2019  ? CALCIUM 8.5 (L) 01/19/2019  ? ?  Bone ?No results found for: [place], H139778, [id number], LP3790WI0, 25OHVITD1, 25OHVITD2, 25OHVITD3, TESTOFREE, TESTOSTERONE ?  ?Inflammation (CRP: Acute Phase) (ESR: Chronic Phase) ?No results found for: CRP, ESRSEDRATE, LATICACIDVEN ?    ?Note: Above Lab results reviewed. ? ? ?Assessment  ?The primary encounter diagnosis was SI joint arthritis (left). Diagnoses of Piriformis syndrome, left, Intractable neuropathic pain of left foot, Chronic radicular lumbar pain (LEFT), Pain management contract signed, Malignant neoplasm of right breast in female, estrogen receptor positive, unspecified site of breast ([place]), Lumbar post-laminectomy syndrome, and Failed back surgical syndrome were also pertinent to this visit. ? ?Plan of Care  ?Problem-specific:  ?No problem-specific Assessment & Plan notes found for this encounter. ? ?[name] has a current medication list which includes the following long-term medication(s): albuterol, azelastine-fluticasone, calcium carbonate, citalopram, levothyroxine,  omeprazole, and potassium. ? ?Pharmacotherapy (Medications Ordered): ?Meds ordered this encounter  ?Medications  ? buprenorphine (BUTRANS) 20 MCG/HR PTWK  ?  Sig: Place 1 patch onto the skin once a week for 28 days.  ?  Dispense:  4 patch  ?  Refill:  0  ?  Chronic Pain: STOP Act (Not applicable) Fill 1 day early if closed on refill date. Avoid benzodiazepines within 8 hours of opioids  ? traMADol (ULTRAM) 50 MG tablet  ?  Sig: Take 1 tablet (50 mg total) by mouth every 8 (eight) hours as needed for severe pain.  ?  Dispense:  90 tablet  ?  Refill:  0  ?  Fill one day early if pharmacy is closed on scheduled refill date.  ? ?We will obtain urine toxicology screen when the patient follows up next month ? ?Follow-up plan:   ?Return in about 4 weeks (around 06/14/2021) for Medication Management, in person.   ?Recent Visits ?No visits were found meeting these conditions. ?Showing recent visits within past 90 days and meeting all other requirements ?Today's Visits ?Date Type Provider Dept  ?05/17/21 Office Visit Gillis Santa, MD [facility]  ?Showing today's visits and meeting all other requirements ?Future Appointments ?Date Type Provider Dept  ?06/12/21 Appointment Gillis Santa, MD [facility]  ?Showing future appointments within next 90 days and meeting all other requirements ? ?I discussed the assessment and treatment plan with the patient. The patient was provided an opportunity to ask questions and all were answered. The patient agreed with the plan and demonstrated an understanding of the instructions. ? ?Patient advised to call back or seek an in-person evaluation if the symptoms or condition worsens. ? ?Duration of encounter: 30 minutes. ? ?Note by: Gillis Santa, MD ?Date: 05/17/2021; Time: 1:26 PM ?

## 2021-06-12 ENCOUNTER — Other Ambulatory Visit: Payer: Self-pay | Admitting: Student in an Organized Health Care Education/Training Program

## 2021-06-12 ENCOUNTER — Ambulatory Visit
Admission: RE | Admit: 2021-06-12 | Discharge: 2021-06-12 | Disposition: A | Discharge status: Home / Self Care | Payer: Medicare Other | Source: Ambulatory Visit | Attending: Student in an Organized Health Care Education/Training Program | Admitting: Student in an Organized Health Care Education/Training Program

## 2021-06-12 ENCOUNTER — Ambulatory Visit (HOSPITAL_BASED_OUTPATIENT_CLINIC_OR_DEPARTMENT_OTHER): Payer: Medicare Other | Admitting: Student in an Organized Health Care Education/Training Program

## 2021-06-12 ENCOUNTER — Ambulatory Visit
Admission: RE | Admit: 2021-06-12 | Discharge: 2021-06-12 | Disposition: A | Discharge status: Home / Self Care | Payer: Medicare Other | Attending: Student in an Organized Health Care Education/Training Program | Admitting: Student in an Organized Health Care Education/Training Program

## 2021-06-12 ENCOUNTER — Encounter: Payer: Self-pay | Admitting: Student in an Organized Health Care Education/Training Program

## 2021-06-12 VITALS — BP 122/84 | HR 84 | Temp 97.2°F | Resp 20 | Ht 61.0 in | Wt 100.0 lb

## 2021-06-12 DIAGNOSIS — M961 Postlaminectomy syndrome, not elsewhere classified: Secondary | ICD-10-CM

## 2021-06-12 DIAGNOSIS — W19XXXA Unspecified fall, initial encounter: Secondary | ICD-10-CM

## 2021-06-12 DIAGNOSIS — Z8673 Personal history of transient ischemic attack (TIA), and cerebral infarction without residual deficits: Secondary | ICD-10-CM | POA: Diagnosis not present

## 2021-06-12 DIAGNOSIS — M898X5 Other specified disorders of bone, thigh: Secondary | ICD-10-CM | POA: Diagnosis present

## 2021-06-12 DIAGNOSIS — G894 Chronic pain syndrome: Secondary | ICD-10-CM | POA: Diagnosis not present

## 2021-06-12 DIAGNOSIS — Z170 Estrogen receptor positive status [ER+]: Secondary | ICD-10-CM

## 2021-06-12 DIAGNOSIS — M25551 Pain in right hip: Secondary | ICD-10-CM | POA: Diagnosis not present

## 2021-06-12 DIAGNOSIS — C50911 Malignant neoplasm of unspecified site of right female breast: Secondary | ICD-10-CM | POA: Diagnosis not present

## 2021-06-12 DIAGNOSIS — M79651 Pain in right thigh: Secondary | ICD-10-CM | POA: Diagnosis present

## 2021-06-12 DIAGNOSIS — M5416 Radiculopathy, lumbar region: Secondary | ICD-10-CM | POA: Insufficient documentation

## 2021-06-12 DIAGNOSIS — M1611 Unilateral primary osteoarthritis, right hip: Secondary | ICD-10-CM | POA: Insufficient documentation

## 2021-06-12 DIAGNOSIS — Z0289 Encounter for other administrative examinations: Secondary | ICD-10-CM

## 2021-06-12 DIAGNOSIS — G8929 Other chronic pain: Secondary | ICD-10-CM

## 2021-06-12 MED ORDER — TRAMADOL HCL 50 MG PO TABS: 50.0000 mg | ORAL_TABLET | Freq: Three times a day (TID) | ORAL | 2 refills | Status: DC | PRN

## 2021-06-12 MED ORDER — BUPRENORPHINE 20 MCG/HR TD PTWK: 1.0000 | MEDICATED_PATCH | TRANSDERMAL | 2 refills | Status: DC

## 2021-06-12 NOTE — Progress Notes (Signed)
Nursing Pain Medication Assessment:  Safety precautions to be maintained throughout the outpatient stay will include: orient to surroundings, keep bed in low position, maintain call bell within reach at all times, provide assistance with transfer out of bed and ambulation.  Medication Inspection Compliance: Pill count conducted under aseptic conditions, in front of the patient. Neither the pills nor the bottle was removed from the patient's sight at any time. Once count was completed pills were immediately returned to the patient in their original bottle.  Medication: See above Pill/Patch Count:  13 of 90 pills remain Pill/Patch Appearance: Markings consistent with prescribed medication Bottle Appearance: Standard pharmacy container. Clearly labeled. Filled Date: 4 / 27 / 2023 Last Medication intake:  Today  Buprenorphine patch 0/4  Removed patch today

## 2021-06-12 NOTE — Progress Notes (Signed)
PROVIDER NOTE: Information contained herein reflects review and annotations entered in association with encounter. Interpretation of such information and data should be left to medically-trained personnel. Information provided to patient can be located elsewhere in the medical record under "Patient Instructions". Document created using STT-dictation technology, any transcriptional errors that may result from process are unintentional.    Patient: Julie Mckinney  Service Category: E/M  Provider: Gillis Santa, MD  DOB: Dec 23, 1934  DOS: 06/12/2021  Specialty: Interventional Pain Management  MRN: [id number]  Setting: Ambulatory outpatient  PCP: Gladstone Lighter, MD  Type: Established Patient    Referring Provider: Gladstone Lighter, MD  Location: Office  Delivery: Face-to-face     HPI  Julie Mckinney, a 87 y.o. year old female, is here today because of her Acute pain of right thigh [M79.651]. Julie Mckinney's primary complain today is Back Pain Last encounter: My last encounter with her was on 05/17/2021. Pertinent problems: Julie Mckinney has History of CVA (cerebrovascular accident) without residual deficits; MDD (major depressive disorder), recurrent episode, mild ([place]); T10 vertebral fracture ([place]); Closed stable burst fracture of T12 vertebra ([place]); Lumbar post-laminectomy syndrome; MDD (major depressive disorder), recurrent, in full remission ([place]); Pain management contract signed; and Chronic pain syndrome on their pertinent problem list. Pain Assessment: Severity of Chronic pain is reported as a 10-Worst pain ever/10. Location: Back Left/down back of leg to foot, effects all toes. Onset: More than a month ago. Quality: Aching, Constant, Discomfort. Timing: Constant. Modifying factor(s): Medicatins, rest. Vitals:  height is '5\' 1"'  (1.549 m) and weight is 100 lb (45.4 kg). Her temperature is 97.2 F (36.2 C) (abnormal). Her blood pressure is 122/84 and her pulse is 84. Her respiration is 20 and  oxygen saturation is 96%.   Reason for encounter: medication management.  Danali presents today for medication management as well as an acute increase in her musculoskeletal pain related to a fall that she sustained last week.  She has a pretty severe abrasion along her right arm that is still somewhat bloody.  She has is covered with a Band-Aid.  She also has a right hematoma along her femur which is very tender to palpation.  She has not seen a doctor after her fall and was waiting to see me.  She is wondering if she has sustained a fracture in her hip or femur.  I informed her that I will obtain x-rays that she can get on her way out to assess.  I also recommend Dermabond to her right arm which I was able to do for her during her visit.  Otherwise refill of Butrans patch and tramadol as below, no change in dose.  Pharmacotherapy Assessment  Analgesic: Butrans patch 72mg/hr, continue tramadol 50 mg TID prn    Monitoring: [place] PMP: PDMP reviewed during this encounter.       Pharmacotherapy: No side-effects or adverse reactions reported. Compliance: No problems identified. Effectiveness: Clinically acceptable.  GIgnatius Specking RN  06/12/2021  1:44 PM  Sign when Signing Visit Nursing Pain Medication Assessment:  Safety precautions to be maintained throughout the outpatient stay will include: orient to surroundings, keep bed in low position, maintain call bell within reach at all times, provide assistance with transfer out of bed and ambulation.  Medication Inspection Compliance: Pill count conducted under aseptic conditions, in front of the patient. Neither the pills nor the bottle was removed from the patient's sight at any time. Once count was completed pills were immediately returned to the patient in their original  bottle.  Medication: See above Pill/Patch Count:  13 of 90 pills remain Pill/Patch Appearance: Markings consistent with prescribed medication Bottle Appearance: Standard  pharmacy container. Clearly labeled. Filled Date: 4 / 27 / 2023 Last Medication intake:  Today  Buprenorphine patch 0/4  Removed patch today  UDS:  No results found for: SUMMARY   ROS  Constitutional: Denies any fever or chills Gastrointestinal: No reported hemesis, hematochezia, vomiting, or acute GI distress Musculoskeletal:  Right femur, right hip pain, right arm pain Neurological: No reported episodes of acute onset apraxia, aphasia, dysarthria, agnosia, amnesia, paralysis, loss of coordination, or loss of consciousness  Medication Review  Azelastine-Fluticasone, Fluticasone-Umeclidin-Vilant, Glucosamine Sulfate, Multi-Vitamins, Nebulizers, Olopatadine HCl, Potassium, acetaminophen, albuterol, atorvastatin, buprenorphine, busPIRone, calcium carbonate, citalopram, clobetasol cream, levothyroxine, lidocaine-prilocaine, omeprazole, predniSONE, tolterodine, traMADol, and vitamin B-12  History Review  Allergy: Julie Mckinney is allergic to tape. Drug: Julie Mckinney  reports no history of drug use. Alcohol:  reports current alcohol use of about 1.0 standard drink per week. Tobacco:  reports that she quit smoking about 25 years ago. Her smoking use included cigarettes. She has a 15.00 pack-year smoking history. She has never used smokeless tobacco. Social: Julie Mckinney  reports that she quit smoking about 25 years ago. Her smoking use included cigarettes. She has a 15.00 pack-year smoking history. She has never used smokeless tobacco. She reports current alcohol use of about 1.0 standard drink per week. She reports that she does not use drugs. Medical:  has a past medical history of Anxiety, Asthma, Breast cancer ([place]) (Right), COPD (chronic obstructive pulmonary disease) ([place]), Depression, and Personal history of radiation therapy. Surgical: Julie Mckinney  has a past surgical history that includes Appendectomy; Colonoscopy; Cholecystectomy; Abdominal hysterectomy; Esophagogastroduodenoscopy (egd)  with propofol (N/A, 08/14/2016); Colonoscopy with propofol (N/A, 08/14/2016); Back surgery; Breast biopsy (Right, 2015); Breast excisional biopsy (Right, 1975); and Breast lumpectomy (Right, 2015). Family: family history includes Cancer in her father.  Laboratory Chemistry Profile   Renal Lab Results  Component Value Date   BUN 27 (H) 01/19/2019   CREATININE 0.56 01/19/2019   GFRAA >60 01/19/2019   GFRNONAA >60 01/19/2019    Hepatic Lab Results  Component Value Date   AST 20 01/17/2019   ALT 18 01/17/2019   ALBUMIN 3.5 01/17/2019   ALKPHOS 34 (L) 01/17/2019    Electrolytes Lab Results  Component Value Date   NA 138 03/07/2021   K 4.0 01/19/2019   CL 111 01/19/2019   CALCIUM 8.5 (L) 01/19/2019    Bone No results found for: VD25OH, VD125OH2TOT, GU4403KV4, QV9563OV5, 25OHVITD1, 25OHVITD2, 25OHVITD3, TESTOFREE, TESTOSTERONE  Inflammation (CRP: Acute Phase) (ESR: Chronic Phase) No results found for: CRP, ESRSEDRATE, LATICACIDVEN       Note: Above Lab results reviewed.  Recent Imaging Review  MM 3D SCREEN BREAST BILATERAL CLINICAL DATA:  Screening.  EXAM: DIGITAL SCREENING BILATERAL MAMMOGRAM WITH TOMOSYNTHESIS AND CAD  TECHNIQUE: Bilateral screening digital craniocaudal and mediolateral oblique mammograms were obtained. Bilateral screening digital breast tomosynthesis was performed. The images were evaluated with computer-aided detection.  COMPARISON:  Previous exam(s).  ACR Breast Density Category c: The breast tissue is heterogeneously dense, which may obscure small masses.  FINDINGS: There are no findings suspicious for malignancy.  IMPRESSION: No mammographic evidence of malignancy. A result letter of this screening mammogram will be mailed directly to the patient.  RECOMMENDATION: Screening mammogram in one year. (Code:SM-B-01Y)  BI-RADS CATEGORY  1: Negative.  Electronically Signed   By: Lillia Mountain M.D.   On: 10/30/2020  10:34 Note: Reviewed         Physical Exam  General appearance: Well nourished, well developed, and well hydrated. In no apparent acute distress Mental status: Alert, oriented x 3 (person, place, & time)       Respiratory: No evidence of acute respiratory distress Eyes: PERLA Vitals: BP 122/84   Pulse 84   Temp (!) 97.2 F (36.2 C)   Resp 20   Ht '5\' 1"'  (1.549 m)   Wt 100 lb (45.4 kg)   SpO2 96%   BMI 18.89 kg/m  BMI: Estimated body mass index is 18.89 kg/m as calculated from the following:   Height as of this encounter: '5\' 1"'  (1.549 m).   Weight as of this encounter: 100 lb (45.4 kg). Ideal: Female patients must weigh at least 45.5 kg to calculate ideal body weight  Abrasion along right arm, bandage present.  This was cleansed with chlorhexidine and Dermabond was applied  Right femur hematoma, large, discolored, tender to palpation  4 out of 5 strength R lower extremity: Plantar flexion, dorsiflexion, knee flexion, knee extension.  Limited by pain   Assessment   Diagnosis Status  1. Acute pain of right thigh   2. Fall, initial encounter   3. Pain in right femur   4. Chronic radicular lumbar pain (LEFT)   5. Pain management contract signed   6. Malignant neoplasm of right breast in female, estrogen receptor positive, unspecified site of breast ([place])   7. Lumbar post-laminectomy syndrome   8. Failed back surgical syndrome   9. Chronic pain syndrome    Worsening Controlled Worsening   Updated Problems: Problem  Pain in Right Femur  Malignant Neoplasm of Right Breast in Female, Estrogen Receptor Positive ([facility])    Plan of Care    Julie Mckinney has a current medication list which includes the following long-term medication(s): albuterol, azelastine-fluticasone, citalopram, levothyroxine, omeprazole, potassium, and calcium carbonate.  Pharmacotherapy (Medications Ordered): Meds ordered this encounter  Medications   buprenorphine (BUTRANS) 20 MCG/HR PTWK    Sig: Place 1 patch onto  the skin once a week.    Dispense:  4 patch    Refill:  2    Chronic Pain: STOP Act (Not applicable) Fill 1 day early if closed on refill date. Avoid benzodiazepines within 8 hours of opioids   traMADol (ULTRAM) 50 MG tablet    Sig: Take 1 tablet (50 mg total) by mouth every 8 (eight) hours as needed for severe pain.    Dispense:  90 tablet    Refill:  2    Fill one day early if pharmacy is closed on scheduled refill date.   Orders:  Orders Placed This Encounter  Procedures   DG HIP UNILAT W OR W/O PELVIS 2-3 VIEWS RIGHT    Please describe any evidence of DJD, such as joint narrowing, asymmetry, cysts, or any anomalies in bone density, production, or erosion.    Standing Status:   Future    Standing Expiration Date:   07/13/2021    Scheduling Instructions:     Imaging must be done as soon as possible. Inform patient that order will expire within 30 days and I will not renew it.    Order Specific Question:   Reason for Exam (SYMPTOM  OR DIAGNOSIS REQUIRED)    Answer:   Right hip pain/arthralgia    Order Specific Question:   Preferred imaging location?    Answer:   [facility]    Order Specific Question:  Call Results- Best Contact Number?    Answer:   [phone]) [phone] ([facility])    Order Specific Question:   Release to patient    Answer:   Immediate   DG Tibia/Fibula Right    Standing Status:   Future    Standing Expiration Date:   06/13/2022    Order Specific Question:   Reason for Exam (SYMPTOM  OR DIAGNOSIS REQUIRED)    Answer:   right femur hematoma and pain after fall    Order Specific Question:   Preferred imaging location?    Answer:   [facility]   DG FEMUR 1V RIGHT    Standing Status:   Future    Standing Expiration Date:   06/13/2022    Order Specific Question:   Reason for Exam (SYMPTOM  OR DIAGNOSIS REQUIRED)    Answer:   right leg pain after fall and significant hematoma    Order Specific Question:   Preferred imaging location?    Answer:   [facility]   Follow-up plan:   Return in about 3 months (around 09/12/2021) for Medication Management, in person.      Recent Visits Date Type Provider Dept  05/17/21 Office Visit Gillis Santa, MD [facility]  Showing recent visits within past 90 days and meeting all other requirements Today's Visits Date Type Provider Dept  06/12/21 Office Visit Gillis Santa, MD [facility]  Showing today's visits and meeting all other requirements Future Appointments Date Type Provider Dept  09/04/21 Appointment Gillis Santa, MD [facility]  Showing future appointments within next 90 days and meeting all other requirements  I discussed the assessment and treatment plan with the patient. The patient was provided an opportunity to ask questions and all were answered. The patient agreed with the plan and demonstrated an understanding of the instructions.  Patient advised to call back or seek an in-person evaluation if the symptoms or condition worsens.  Duration of encounter: 69mnutes.  Note by: BGillis Santa MD Date: 06/12/2021; Time: 2:26 PM

## 2021-06-14 ENCOUNTER — Telehealth: Payer: Self-pay

## 2021-06-14 NOTE — Telephone Encounter (Signed)
Per Dr Holley Raring request, patient notified of xrays results.

## 2021-06-21 ENCOUNTER — Telehealth: Payer: Self-pay | Admitting: Student in an Organized Health Care Education/Training Program

## 2021-06-21 NOTE — Telephone Encounter (Signed)
Pt stated she fell and messed up her hip. Also, Pt said that nothing (Tramadol, Pain Patches) was not helping, Pt would like something else for pain. Please call Pt with an update.

## 2021-06-25 ENCOUNTER — Encounter: Payer: Self-pay | Admitting: Student in an Organized Health Care Education/Training Program

## 2021-06-25 ENCOUNTER — Ambulatory Visit
Payer: Medicare Other | Attending: Student in an Organized Health Care Education/Training Program | Admitting: Student in an Organized Health Care Education/Training Program

## 2021-06-25 VITALS — BP 133/70 | HR 85 | Temp 98.2°F | Ht 60.0 in | Wt 98.0 lb

## 2021-06-25 DIAGNOSIS — M545 Low back pain, unspecified: Secondary | ICD-10-CM | POA: Insufficient documentation

## 2021-06-25 DIAGNOSIS — S7011XA Contusion of right thigh, initial encounter: Secondary | ICD-10-CM | POA: Diagnosis not present

## 2021-06-25 DIAGNOSIS — M25551 Pain in right hip: Secondary | ICD-10-CM | POA: Insufficient documentation

## 2021-06-25 DIAGNOSIS — Z8673 Personal history of transient ischemic attack (TIA), and cerebral infarction without residual deficits: Secondary | ICD-10-CM | POA: Insufficient documentation

## 2021-06-25 DIAGNOSIS — W19XXXA Unspecified fall, initial encounter: Secondary | ICD-10-CM | POA: Diagnosis not present

## 2021-06-25 DIAGNOSIS — M898X5 Other specified disorders of bone, thigh: Secondary | ICD-10-CM | POA: Diagnosis present

## 2021-06-25 DIAGNOSIS — G894 Chronic pain syndrome: Secondary | ICD-10-CM | POA: Insufficient documentation

## 2021-06-25 DIAGNOSIS — M79651 Pain in right thigh: Secondary | ICD-10-CM | POA: Insufficient documentation

## 2021-06-25 MED ORDER — ORPHENADRINE CITRATE 30 MG/ML IJ SOLN
INTRAMUSCULAR | Status: AC
  Filled 2021-06-25: qty 2

## 2021-06-25 MED ORDER — KETOROLAC TROMETHAMINE 30 MG/ML IJ SOLN
30.0000 mg | Freq: Once | INTRAMUSCULAR | Status: AC
  Administered 2021-06-25: 30 mg via INTRAMUSCULAR

## 2021-06-25 MED ORDER — KETOROLAC TROMETHAMINE 30 MG/ML IJ SOLN
INTRAMUSCULAR | Status: AC
  Filled 2021-06-25: qty 1

## 2021-06-25 MED ORDER — ORPHENADRINE CITRATE 30 MG/ML IJ SOLN
30.0000 mg | Freq: Once | INTRAMUSCULAR | Status: AC
  Administered 2021-06-25: 30 mg via INTRAMUSCULAR

## 2021-06-25 NOTE — Patient Instructions (Signed)
AVOID NSAIDS FOR NEXT 5 DAYS

## 2021-06-25 NOTE — Progress Notes (Signed)
Safety precautions to be maintained throughout the outpatient stay will include: orient to surroundings, keep bed in low position, maintain call bell within reach at all times, provide assistance with transfer out of bed and ambulation.  

## 2021-06-25 NOTE — Progress Notes (Signed)
PROVIDER NOTE: Information contained herein reflects review and annotations entered in association with encounter. Interpretation of such information and data should be left to medically-trained personnel. Information provided to patient can be located elsewhere in the medical record under "Patient Instructions". Document created using STT-dictation technology, any transcriptional errors that may result from process are unintentional.    Patient: Julie Mckinney  Service Category: E/M  Provider: Gillis Santa, MD  DOB: [birth year]  DOS: 06/25/2021  Specialty: Interventional Pain Management  MRN: [id number]  Setting: Ambulatory outpatient  PCP: Julie Lighter, MD  Type: Established Patient    Referring Provider: Gladstone Lighter, MD  Location: Office  Delivery: Face-to-face     HPI  [name], a 87 y.o. year old female, is here today because of her Acute pain of right thigh [M79.651]. [name]'s primary complain today is Back Pain (lower) Last encounter: My last encounter with her was on 06/21/2021. Pertinent problems: [name] has History of CVA (cerebrovascular accident) without residual deficits; MDD (major depressive disorder), recurrent episode, mild (Julie Mckinney); T10 vertebral fracture (Julie Mckinney); Closed stable burst fracture of T12 vertebra (Julie Mckinney); Lumbar post-laminectomy syndrome; MDD (major depressive disorder), recurrent, in full remission (Julie Mckinney); Pain management contract signed; and Chronic pain syndrome on their pertinent problem list. Pain Assessment: Severity of Chronic pain is reported as a 9 /10. Location: Back Left, Right/pain radiaties down to her hips and buttock. Onset: More than a month ago. Quality: Aching, Constant, Discomfort. Timing: Constant. Modifying factor(s): laying down still, pain meds and patches. Vitals:  height is 5' (1.524 m) and weight is 98 lb (44.5 kg). Her temperature is 98.2 F (36.8 C). Her blood pressure is 133/70 and her pulse is 85. Her oxygen saturation  is 96%.   Reason for encounter:   Nursing visit for intramuscular Norflex and Toradol for increased low back pain related to fall.  Medication Review  Azelastine-Fluticasone, Fluticasone-Umeclidin-Vilant, Glucosamine Sulfate, Multi-Vitamins, Nebulizers, Olopatadine HCl, Potassium, acetaminophen, albuterol, atorvastatin, buprenorphine, busPIRone, calcium carbonate, citalopram, clobetasol cream, levothyroxine, lidocaine-prilocaine, omeprazole, predniSONE, tolterodine, traMADol, and vitamin B-12  History Review  Allergy: [name] is allergic to tape. Drug: [name]  reports no history of drug use. Alcohol:  reports current alcohol use of about 1.0 standard drink per week. Tobacco:  reports that she quit smoking about 26 years ago. Her smoking use included cigarettes. She has a 15.00 pack-year smoking history. She has never used smokeless tobacco. Social: [name]  reports that she quit smoking about 26 years ago. Her smoking use included cigarettes. She has a 15.00 pack-year smoking history. She has never used smokeless tobacco. She reports current alcohol use of about 1.0 standard drink per week. She reports that she does not use drugs. Medical:  has a past medical history of Anxiety, Asthma, Breast cancer (Julie Mckinney) (Right), COPD (chronic obstructive pulmonary disease) ([place]), Depression, and Personal history of radiation therapy. Surgical: [name]  has a past surgical history that includes Appendectomy; Colonoscopy; Cholecystectomy; Abdominal hysterectomy; Esophagogastroduodenoscopy (egd) with propofol (N/A, 08/14/2016); Colonoscopy with propofol (N/A, 08/14/2016); Back surgery; Breast biopsy (Right, 2015); Breast excisional biopsy (Right, 1975); and Breast lumpectomy (Right, 2015). Family: family history includes Cancer in her father.  Laboratory Chemistry Profile   Renal Lab Results  Component Value Date   BUN 27 (H) 01/19/2019   CREATININE 0.56 01/19/2019   GFRAA >60 01/19/2019    GFRNONAA >60 01/19/2019    Hepatic Lab Results  Component Value Date   AST 20 01/17/2019   ALT 18 01/17/2019  ALBUMIN 3.5 01/17/2019   ALKPHOS 34 (L) 01/17/2019    Electrolytes Lab Results  Component Value Date   NA 138 03/07/2021   K 4.0 01/19/2019   CL 111 01/19/2019   CALCIUM 8.5 (L) 01/19/2019    Bone No results found for: VD25OH, VD125OH2TOT, VD3125OH2, VD2125OH2, 25OHVITD1, 25OHVITD2, 25OHVITD3, TESTOFREE, TESTOSTERONE  Inflammation (CRP: Acute Phase) (ESR: Chronic Phase) No results found for: CRP, ESRSEDRATE, LATICACIDVEN       Note: Above Lab results reviewed.  Recent Imaging Review  DG FEMUR, MIN 2 VIEWS RIGHT CLINICAL DATA:  Right femur hematoma and pain after fall.  EXAM: RIGHT TIBIA AND FIBULA - 2 VIEW; RIGHT FEMUR 2 VIEWS  COMPARISON:: COMPARISON: Pelvis and left hip radiographs 07/14/2019 and 01/17/2019  FINDINGS: Pelvis and right hip:  There is diffuse decreased bone mineralization. L4 through S1  transpedicular rod and screw fusion hardware is again seen. Mild  pubic symphysis joint space narrowing and peripheral osteophytosis.  The bilateral sacroiliac and the bilateral femoroacetabular joint  spaces are maintained.  Redemonstration of bilateral femoral intramedullary nails.  Right femur:  On the provided right femoral radiographs, the entire length of the  right-sided long-stem intramedullary nail is seen with a single  distal interlocking screw and single oblique intertrochanteric  screw. This spans a remote healed fracture of the mid distance of  the right femoral diaphysis with mild regional cortical thickening.  Mild right femoroacetabular joint space narrowing and mild right  femoral head-neck junction degenerative osteophytes.  Right tibia and fibula:  Diffuse decreased bone mineralization. The ankle mortise is  symmetric and intact. No acute fracture or dislocation.  IMPRESSION: 1. Status post bilateral femoral  ORIF. The entire right femoral  long-stem intramedullary nail is imaged, spanning a healed right mid  femoral diaphyseal fracture. No evidence of hardware failure.  2. Mild right femoroacetabular osteoarthritis.  Electronically Signed   By: Ronald  Viola M.D.   On: 06/13/2021 12:58 DG Tibia/Fibula Right CLINICAL DATA:  Right femur hematoma and pain after fall.  EXAM: RIGHT TIBIA AND FIBULA - 2 VIEW; RIGHT FEMUR 2 VIEWS  COMPARISON:: COMPARISON: Pelvis and left hip radiographs 07/14/2019 and 01/17/2019  FINDINGS: Pelvis and right hip:  There is diffuse decreased bone mineralization. L4 through S1  transpedicular rod and screw fusion hardware is again seen. Mild  pubic symphysis joint space narrowing and peripheral osteophytosis.  The bilateral sacroiliac and the bilateral femoroacetabular joint  spaces are maintained.  Redemonstration of bilateral femoral intramedullary nails.  Right femur:  On the provided right femoral radiographs, the entire length of the  right-sided long-stem intramedullary nail is seen with a single  distal interlocking screw and single oblique intertrochanteric  screw. This spans a remote healed fracture of the mid distance of  the right femoral diaphysis with mild regional cortical thickening.  Mild right femoroacetabular joint space narrowing and mild right  femoral head-neck junction degenerative osteophytes.  Right tibia and fibula:  Diffuse decreased bone mineralization. The ankle mortise is  symmetric and intact. No acute fracture or dislocation.  IMPRESSION: 1. Status post bilateral femoral ORIF. The entire right femoral  long-stem intramedullary nail is imaged, spanning a healed right mid  femoral diaphyseal fracture. No evidence of hardware failure.  2. Mild right femoroacetabular osteoarthritis.  Electronically Signed   By: Ronald  Viola M.D.   On: 06/13/2021 12:58 DG HIP UNILAT W OR W/O PELVIS 2-3 VIEWS  RIGHT CLINICAL DATA:  Right hip pain/arthralgia. Right leg pain after fall and significant hematoma.    EXAM: DG HIP (WITH OR WITHOUT PELVIS) 2-3V RIGHT  COMPARISON:  Pelvis and left hip radiographs 07/14/2019 and 01/17/2019  FINDINGS: Pelvis and right hip:  There is diffuse decreased bone mineralization. L4 through S1 transpedicular rod and screw fusion hardware is again seen. Mild pubic symphysis joint space narrowing and peripheral osteophytosis. The bilateral sacroiliac and the bilateral femoroacetabular joint spaces are maintained.  Redemonstration of bilateral femoral intramedullary nails.  Right femur:  On the provided right femoral radiographs, the entire length of the right-sided long-stem intramedullary nail is seen with a single distal interlocking screw and single oblique intertrochanteric screw. This spans a remote healed fracture of the mid distance of the right femoral diaphysis with mild regional cortical thickening.  Mild right femoroacetabular joint space narrowing and mild right femoral head-neck junction degenerative osteophytes.  Right tibia and fibula:  Diffuse decreased bone mineralization. The ankle mortise is symmetric and intact. No acute fracture or dislocation.  IMPRESSION: 1. Status post bilateral femoral ORIF. The entire right femoral long-stem intramedullary nail is imaged, spanning a healed right mid femoral diaphyseal fracture. No evidence of hardware failure. 2. Mild right femoroacetabular osteoarthritis.  Electronically Signed   By: Yvonne Kendall M.D.   On: 06/13/2021 10:50 Note: Reviewed        Physical Exam  General appearance: Well nourished, well developed, and well hydrated. In no apparent acute distress Mental status: Alert, oriented x 3 (person, place, & time)       Respiratory: No evidence of acute respiratory distress Eyes: PERLA Vitals: BP 133/70   Pulse 85   Temp 98.2 F (36.8 C)   Ht 5' (1.524 m)   Wt 98 lb (44.5  kg)   SpO2 96%   BMI 19.14 kg/m  BMI: Estimated body mass index is 19.14 kg/m as calculated from the following:   Height as of this encounter: 5' (1.524 m).   Weight as of this encounter: 98 lb (44.5 kg). Ideal: Female patients must weigh at least 45.5 kg to calculate ideal body weight  Assessment   Diagnosis Status  1. Acute pain of right thigh   2. Fall, initial encounter   3. Pain in right femur   4. Chronic pain syndrome    Persistent Controlled Controlled    Plan of Care    Ms. Makhiya Coburn has a current medication list which includes the following long-term medication(s): albuterol, azelastine-fluticasone, calcium carbonate, citalopram, levothyroxine, omeprazole, and potassium.  Pharmacotherapy (Medications Ordered): Meds ordered this encounter  Medications   ketorolac (TORADOL) 30 MG/ML injection 30 mg   orphenadrine (NORFLEX) injection 30 mg   Follow-up plan:   Return for Keep sch. appt.         Recent Visits Date Type Provider Dept  06/12/21 Office Visit Julie Santa, MD [facility]  05/17/21 Office Visit Julie Santa, MD [facility]  Showing recent visits within past 90 days and meeting all other requirements Today's Visits Date Type Provider Dept  06/25/21 Procedure visit Julie Santa, MD [facility]  Showing today's visits and meeting all other requirements Future Appointments Date Type Provider Dept  09/04/21 Appointment Julie Santa, MD [facility]  Showing future appointments within next 90 days and meeting all other requirements  I discussed the assessment and treatment plan with the patient. The patient was provided an opportunity to ask questions and all were answered. The patient agreed with the plan and demonstrated an understanding of the instructions.  Patient advised to call back or seek  an in-person evaluation if the symptoms or condition worsens.  Duration of encounter: 15minutes.  Note  by:  , MD Date: 06/25/2021; Time: 9:41 AM 

## 2021-06-27 ENCOUNTER — Telehealth: Payer: Self-pay | Admitting: Student in an Organized Health Care Education/Training Program

## 2021-06-27 NOTE — Telephone Encounter (Signed)
Patient came in on 06-26-21. Pt said that the injections hasn't help at all. Pt is in pain, please give pt an call. Thanks

## 2021-06-28 ENCOUNTER — Telehealth: Payer: Self-pay | Admitting: Student in an Organized Health Care Education/Training Program

## 2021-07-04 NOTE — Telephone Encounter (Signed)
error 

## 2021-07-05 ENCOUNTER — Telehealth: Payer: Self-pay | Admitting: Student in an Organized Health Care Education/Training Program

## 2021-07-05 NOTE — Telephone Encounter (Signed)
Patient is having a lot of pain, hurting when she sits or stands or lays down, not sleeping. Had N/T shot last week. Did not help, meds are not helping. Please call patient

## 2021-07-05 NOTE — Telephone Encounter (Signed)
Please schedule a vv with Dr Holley Raring to discuss plan of care.

## 2021-07-11 ENCOUNTER — Ambulatory Visit
Payer: Medicare Other | Attending: Student in an Organized Health Care Education/Training Program | Admitting: Student in an Organized Health Care Education/Training Program

## 2021-07-11 ENCOUNTER — Encounter: Payer: Self-pay | Admitting: Student in an Organized Health Care Education/Training Program

## 2021-07-11 DIAGNOSIS — M961 Postlaminectomy syndrome, not elsewhere classified: Secondary | ICD-10-CM | POA: Diagnosis not present

## 2021-07-11 DIAGNOSIS — M5416 Radiculopathy, lumbar region: Secondary | ICD-10-CM | POA: Diagnosis not present

## 2021-07-11 DIAGNOSIS — G8929 Other chronic pain: Secondary | ICD-10-CM

## 2021-07-11 DIAGNOSIS — G894 Chronic pain syndrome: Secondary | ICD-10-CM

## 2021-07-11 NOTE — Progress Notes (Signed)
Patient: Julie Mckinney  Service Category: E/M  Provider: Gillis Santa, MD  DOB: 1934/05/20  DOS: 07/11/2021  Location: Office  MRN: [id number]  Setting: Ambulatory outpatient  Referring Provider: Gladstone Lighter, MD  Type: Established Patient  Specialty: Interventional Pain Management  PCP: Gladstone Lighter, MD  Location: Remote location  Delivery: TeleHealth     Virtual Encounter - Pain Management PROVIDER NOTE: Information contained herein reflects review and annotations entered in association with encounter. Interpretation of such information and data should be left to medically-trained personnel. Information provided to patient can be located elsewhere in the medical record under "Patient Instructions". Document created using STT-dictation technology, any transcriptional errors that may result from process are unintentional.    Contact & Pharmacy Preferred: [phone] Home: [phone] (home) Mobile: [phone] (mobile) E-mail: [email or web address] [place] [address] Phone: [phone] Fax: [phone]   Pre-screening  [name] offered "in-person" vs "virtual" encounter. She indicated preferring virtual for this encounter.   Reason COVID-19*  Social distancing based on CDC and AMA recommendations.   [name] on 07/11/2021 via telephone.      I clearly identified myself as Gillis Santa, MD. I verified that I was speaking with the correct person using two identifiers (Name: Julie Mckinney, and date of birth: February 13, 1934).  Consent I sought verbal advanced consent from Julie Mckinney for virtual visit interactions. I informed Julie Mckinney of possible security and privacy concerns, risks, and limitations associated with providing "not-in-person" medical evaluation and management services. I also informed Julie Mckinney of the availability of "in-person"  appointments. Finally, I informed her that there would be a charge for the virtual visit and that she could be  personally, fully or partially, financially responsible for it. Julie Mckinney expressed understanding and agreed to proceed.   Historic Elements   Julie Mckinney is a 87 y.o. year old, female patient evaluated today after our last contact on 07/05/2021. Julie Mckinney  has a past medical history of Anxiety, Asthma, Breast cancer ([place]) (Right), COPD (chronic obstructive pulmonary disease) ([place]), Depression, and Personal history of radiation therapy. She also  has a past surgical history that includes Appendectomy; Colonoscopy; Cholecystectomy; Abdominal hysterectomy; Esophagogastroduodenoscopy (egd) with propofol (N/A, 08/14/2016); Colonoscopy with propofol (N/A, 08/14/2016); Back surgery; Breast biopsy (Right, 2015); Breast excisional biopsy (Right, 1975); and Breast lumpectomy (Right, 2015). Julie Mckinney has a current medication list which includes the following prescription(s): acetaminophen, albuterol, atorvastatin, azelastine-fluticasone, buprenorphine, buspirone, calcium carbonate, citalopram, clobetasol cream, trelegy ellipta, glucosamine sulfate, levothyroxine, lidocaine-prilocaine, multi-vitamins, nebulizers, omeprazole, pataday, potassium, prednisone, tolterodine, tramadol, and vitamin b-12. She  reports that she quit smoking about 26 years ago. Her smoking use included cigarettes. She has a 15.00 pack-year smoking history. She has never used smokeless tobacco. She reports current alcohol use of about 1.0 standard drink of alcohol per week. She reports that she does not use drugs. Julie Mckinney is allergic to tape.   HPI  Today, she is being contacted for worsening of previously known (established) problem  Julie Mckinney is having increased low back and leg pain that is gotten worse since her fall.  This has been refractory to conservative measures including oral steroid taper as well as  intramuscular Norflex and Toradol.  She describes pain radiating down both of her legs along the posterior lateral portion, left greater than right.  We discussed a caudal ESI.  Risks and benefits reviewed and patient would like to proceed.  Pharmacotherapy Assessment   Opioid Analgesic: Butrans patch 53mg/hr, continue tramadol 50 mg TID prn    Monitoring: [place] PMP: PDMP not reviewed this encounter.       Pharmacotherapy: No side-effects or adverse reactions reported. Compliance: No problems identified. Effectiveness: Clinically acceptable. Plan: Refer to "POC". UDS: No results found for: "SUMMARY"   Laboratory Chemistry Profile   Renal Lab Results  Component Value Date   BUN 27 (H) 01/19/2019   CREATININE 0.56 01/19/2019   GFRAA >60 01/19/2019   GFRNONAA >60 01/19/2019    Hepatic Lab Results  Component Value Date   AST 20 01/17/2019   ALT 18 01/17/2019   ALBUMIN 3.5 01/17/2019   ALKPHOS 34 (L) 01/17/2019    Electrolytes Lab Results  Component Value Date   NA 138 03/07/2021   K 4.0 01/19/2019   CL 111 01/19/2019   CALCIUM 8.5 (L) 01/19/2019    Bone No results found for: "VD25OH", "VD125OH2TOT", "VTI4580DX8, "VPJ8250NL9, "25OHVITD1", "25OHVITD2", "25OHVITD3", "TESTOFREE", "TESTOSTERONE"  Inflammation (CRP: Acute Phase) (ESR: Chronic Phase) No results found for: "CRP", "ESRSEDRATE", "LATICACIDVEN"       Note: Above Lab results reviewed.  Imaging  DG FEMUR, MIN 2 VIEWS RIGHT CLINICAL DATA:  Right femur hematoma and pain after fall.  EXAM: RIGHT TIBIA AND FIBULA - 2 VIEW; RIGHT FEMUR 2 VIEWS  COMPARISON:: COMPARISON: Pelvis and left hip radiographs 07/14/2019 and 01/17/2019  FINDINGS: Pelvis and right hip:  There is diffuse decreased bone mineralization. L4 through S1  transpedicular rod and screw fusion hardware is again seen. Mild  pubic symphysis joint space narrowing and peripheral osteophytosis.  The bilateral sacroiliac and the bilateral  femoroacetabular joint  spaces are maintained.  Redemonstration of bilateral femoral intramedullary nails.  Right femur:  On the provided right femoral radiographs, the entire length of the  right-sided long-stem intramedullary nail is seen with a single  distal interlocking screw and single oblique intertrochanteric  screw. This spans a remote healed fracture of the mid distance of  the right femoral diaphysis with mild regional cortical thickening.  Mild right femoroacetabular joint space narrowing and mild right  femoral head-neck junction degenerative osteophytes.  Right tibia and fibula:  Diffuse decreased bone mineralization. The ankle mortise is  symmetric and intact. No acute fracture or dislocation.  IMPRESSION: 1. Status post bilateral femoral ORIF. The entire right femoral  long-stem intramedullary nail is imaged, spanning a healed right mid  femoral diaphyseal fracture. No evidence of hardware failure.  2. Mild right femoroacetabular osteoarthritis.  Electronically Signed   By: RYvonne KendallM.D.   On: 06/13/2021 12:58 DG Tibia/Fibula Right CLINICAL DATA:  Right femur hematoma and pain after fall.  EXAM: RIGHT TIBIA AND FIBULA - 2 VIEW; RIGHT FEMUR 2 VIEWS  COMPARISON:: COMPARISON: Pelvis and left hip radiographs 07/14/2019 and 01/17/2019  FINDINGS: Pelvis and right hip:  There is diffuse decreased bone mineralization. L4 through S1  transpedicular rod and screw fusion hardware is again seen. Mild  pubic symphysis joint space narrowing and peripheral osteophytosis.  The bilateral sacroiliac and the bilateral femoroacetabular joint  spaces are maintained.  Redemonstration of bilateral femoral intramedullary nails.  Right femur:  On the provided right femoral radiographs, the entire length of the  right-sided long-stem intramedullary nail is seen with a single  distal interlocking screw and single oblique intertrochanteric  screw.  This spans a remote healed fracture of the mid distance of  the right femoral diaphysis with mild regional cortical thickening.  Mild right femoroacetabular joint space  narrowing and mild right  femoral head-neck junction degenerative osteophytes.  Right tibia and fibula:  Diffuse decreased bone mineralization. The ankle mortise is  symmetric and intact. No acute fracture or dislocation.  IMPRESSION: 1. Status post bilateral femoral ORIF. The entire right femoral  long-stem intramedullary nail is imaged, spanning a healed right mid  femoral diaphyseal fracture. No evidence of hardware failure.  2. Mild right femoroacetabular osteoarthritis.  Electronically Signed   By: Yvonne Kendall M.D.   On: 06/13/2021 12:58 DG HIP UNILAT W OR W/O PELVIS 2-3 VIEWS RIGHT CLINICAL DATA:  Right hip pain/arthralgia. Right leg pain after fall and significant hematoma.  EXAM: DG HIP (WITH OR WITHOUT PELVIS) 2-3V RIGHT  COMPARISON:  Pelvis and left hip radiographs 07/14/2019 and 01/17/2019  FINDINGS: Pelvis and right hip:  There is diffuse decreased bone mineralization. L4 through S1 transpedicular rod and screw fusion hardware is again seen. Mild pubic symphysis joint space narrowing and peripheral osteophytosis. The bilateral sacroiliac and the bilateral femoroacetabular joint spaces are maintained.  Redemonstration of bilateral femoral intramedullary nails.  Right femur:  On the provided right femoral radiographs, the entire length of the right-sided long-stem intramedullary nail is seen with a single distal interlocking screw and single oblique intertrochanteric screw. This spans a remote healed fracture of the mid distance of the right femoral diaphysis with mild regional cortical thickening.  Mild right femoroacetabular joint space narrowing and mild right femoral head-neck junction degenerative osteophytes.  Right tibia and fibula:  Diffuse decreased bone mineralization.  The ankle mortise is symmetric and intact. No acute fracture or dislocation.  IMPRESSION: 1. Status post bilateral femoral ORIF. The entire right femoral long-stem intramedullary nail is imaged, spanning a healed right mid femoral diaphyseal fracture. No evidence of hardware failure. 2. Mild right femoroacetabular osteoarthritis.  Electronically Signed   By: Yvonne Kendall M.D.   On: 06/13/2021 10:50  Assessment  The primary encounter diagnosis was Chronic radicular lumbar pain (LEFT). Diagnoses of Failed back surgical syndrome, Lumbar post-laminectomy syndrome, and Chronic pain syndrome were also pertinent to this visit.  Plan of Care   Orders:  Orders Placed This Encounter  Procedures   Caudal Epidural Injection    Standing Status:   Future    Standing Expiration Date:   08/10/2021    Scheduling Instructions:     Laterality: Midline     Level(s): Sacrococcygeal canal (Tailbone area)     Sedation: Patient's choice     Scheduling Timeframe: As soon as pre-approved    Order Specific Question:   Where will this procedure be performed?    Answer:   [facility]   Follow-up plan:   Return in about 3 weeks (around 08/01/2021) for Caudal ESI , in clinic [facility].    Recent Visits Date Type Provider Dept  06/25/21 Procedure visit Gillis Santa, MD [facility]  06/12/21 Office Visit Gillis Santa, MD [facility]  05/17/21 Office Visit Gillis Santa, MD [facility]  Showing recent visits within past 90 days and meeting all other requirements Today's Visits Date Type Provider Dept  07/11/21 Office Visit Gillis Santa, MD [facility]  Showing today's visits and meeting all other requirements Future Appointments Date Type Provider Dept  09/04/21 Appointment Gillis Santa, MD [facility]  Showing future appointments within next 90 days and meeting all other requirements  I discussed the assessment and treatment plan with the  patient. The patient was provided an opportunity to ask questions and all were  answered. The patient agreed with the plan and demonstrated an understanding of the instructions.  Patient advised to call back or seek an in-person evaluation if the symptoms or condition worsens.  Duration of encounter: 39mnutes.  Note by: BGillis Santa MD Date: 07/11/2021; Time: 4:06 PM

## 2021-08-08 ENCOUNTER — Ambulatory Visit
Payer: Medicare Other | Attending: Student in an Organized Health Care Education/Training Program | Admitting: Student in an Organized Health Care Education/Training Program

## 2021-08-08 ENCOUNTER — Encounter: Payer: Self-pay | Admitting: Student in an Organized Health Care Education/Training Program

## 2021-08-08 ENCOUNTER — Ambulatory Visit
Admission: RE | Admit: 2021-08-08 | Discharge: 2021-08-08 | Disposition: A | Discharge status: Home / Self Care | Payer: Medicare Other | Source: Ambulatory Visit | Attending: Student in an Organized Health Care Education/Training Program | Admitting: Student in an Organized Health Care Education/Training Program

## 2021-08-08 DIAGNOSIS — G8929 Other chronic pain: Secondary | ICD-10-CM | POA: Insufficient documentation

## 2021-08-08 DIAGNOSIS — G894 Chronic pain syndrome: Secondary | ICD-10-CM | POA: Insufficient documentation

## 2021-08-08 DIAGNOSIS — M5416 Radiculopathy, lumbar region: Secondary | ICD-10-CM | POA: Diagnosis present

## 2021-08-08 MED ORDER — DEXAMETHASONE SODIUM PHOSPHATE 10 MG/ML IJ SOLN
10.0000 mg | Freq: Once | INTRAMUSCULAR | Status: AC
  Administered 2021-08-08: 10 mg
  Filled 2021-08-08: qty 1

## 2021-08-08 MED ORDER — SODIUM CHLORIDE 0.9% FLUSH
2.0000 mL | Freq: Once | INTRAVENOUS | Status: AC
  Administered 2021-08-08: 2 mL

## 2021-08-08 MED ORDER — LIDOCAINE HCL 2 % IJ SOLN
20.0000 mL | Freq: Once | INTRAMUSCULAR | Status: AC
  Administered 2021-08-08: 100 mg
  Filled 2021-08-08: qty 40

## 2021-08-08 MED ORDER — IOHEXOL 180 MG/ML  SOLN
10.0000 mL | Freq: Once | INTRAMUSCULAR | Status: AC
  Administered 2021-08-08: 10 mL via EPIDURAL
  Filled 2021-08-08: qty 20

## 2021-08-08 MED ORDER — ROPIVACAINE HCL 2 MG/ML IJ SOLN
2.0000 mL | Freq: Once | INTRAMUSCULAR | Status: AC
  Administered 2021-08-08: 2 mL via EPIDURAL
  Filled 2021-08-08: qty 20

## 2021-08-08 NOTE — Progress Notes (Signed)
PROVIDER NOTE: Interpretation of information contained herein should be left to medically-trained personnel. Specific patient instructions are provided elsewhere under "Patient Instructions" section of medical record. This document was created in part using STT-dictation technology, any transcriptional errors that may result from this process are unintentional.  Patient: Julie Mckinney Type: Established DOB: 1934-01-30 MRN: [id number] PCP: Gladstone Lighter, MD  Service: Procedure DOS: 08/08/2021 Setting: Ambulatory Location: Ambulatory outpatient facility Delivery: Face-to-face Provider: Gillis Santa, MD Specialty: Interventional Pain Management Specialty designation: 09 Location: Outpatient facility Ref. Prov.: Gillis Santa, MD    Primary Reason for Visit: Interventional Pain Management Treatment. CC: Back Pain (left)    Procedure:          Anesthesia, Analgesia, Anxiolysis:  Type: Diagnostic Epidural Steroid Injection #1  Region: Caudal Level: Sacrococcygeal   Laterality: Midline aiming at the left  Anesthesia: Local (1-2% Lidocaine)  Anxiolysis: None  Sedation: None  Guidance: Fluoroscopy           Position: Prone   1. Chronic radicular lumbar pain (LEFT)   2. Chronic pain syndrome    NAS-11 Pain score:   Pre-procedure: 7 /10   Post-procedure: 7 /10     Pre-op H&P Assessment:  Julie Mckinney is a 87 y.o. (year old), female patient, seen today for interventional treatment. She  has a past surgical history that includes Appendectomy; Colonoscopy; Cholecystectomy; Abdominal hysterectomy; Esophagogastroduodenoscopy (egd) with propofol (N/A, 08/14/2016); Colonoscopy with propofol (N/A, 08/14/2016); Back surgery; Breast biopsy (Right, 2015); Breast excisional biopsy (Right, 1975); and Breast lumpectomy (Right, 2015). Julie Mckinney has a current medication list which includes the following prescription(s): acetaminophen, albuterol, atorvastatin, buprenorphine, buspirone, calcium  carbonate, citalopram, trelegy ellipta, glucosamine sulfate, levothyroxine, multi-vitamins, nebulizers, omeprazole, potassium, tolterodine, tramadol, vitamin b-12, azelastine-fluticasone, clobetasol cream, lidocaine-prilocaine, pataday, and prednisone. Her primarily concern today is the Back Pain (left)  Initial Vital Signs:  Pulse/HCG Rate: 65ECG Heart Rate: 77 Temp:  (!) 97 F (36.1 C) Resp: 16 BP:  (!) 151/74 SpO2: 91 %  BMI: Estimated body mass index is 18.94 kg/m as calculated from the following:   Height as of this encounter: 5' (1.524 m).   Weight as of this encounter: 97 lb (44 kg).  Risk Assessment: Allergies: Reviewed. She is allergic to tape.  Allergy Precautions: None required Coagulopathies: Reviewed. None identified.  Blood-thinner therapy: None at this time Active Infection(s): Reviewed. None identified. Julie Mckinney is afebrile  Site Confirmation: Julie Mckinney was asked to confirm the procedure and laterality before marking the site Procedure checklist: Completed Consent: Before the procedure and under the influence of no sedative(s), amnesic(s), or anxiolytics, the patient was informed of the treatment options, risks and possible complications. To fulfill our ethical and legal obligations, as recommended by the American Medical Association's Code of Ethics, I have informed the patient of my clinical impression; the nature and purpose of the treatment or procedure; the risks, benefits, and possible complications of the intervention; the alternatives, including doing nothing; the risk(s) and benefit(s) of the alternative treatment(s) or procedure(s); and the risk(s) and benefit(s) of doing nothing. The patient was provided information about the general risks and possible complications associated with the procedure. These may include, but are not limited to: failure to achieve desired goals, infection, bleeding, organ or nerve damage, allergic reactions, paralysis, and  death. In addition, the patient was informed of those risks and complications associated to Spine-related procedures, such as failure to decrease pain; infection (i.e.: Meningitis, epidural or intraspinal abscess); bleeding (i.e.: epidural hematoma, subarachnoid hemorrhage, or any  other type of intraspinal or peri-dural bleeding); organ or nerve damage (i.e.: Any type of peripheral nerve, nerve root, or spinal cord injury) with subsequent damage to sensory, motor, and/or autonomic systems, resulting in permanent pain, numbness, and/or weakness of one or several areas of the body; allergic reactions; (i.e.: anaphylactic reaction); and/or death. Furthermore, the patient was informed of those risks and complications associated with the medications. These include, but are not limited to: allergic reactions (i.e.: anaphylactic or anaphylactoid reaction(s)); adrenal axis suppression; blood sugar elevation that in diabetics may result in ketoacidosis or comma; water retention that in patients with history of congestive heart failure may result in shortness of breath, pulmonary edema, and decompensation with resultant heart failure; weight gain; swelling or edema; medication-induced neural toxicity; particulate matter embolism and blood vessel occlusion with resultant organ, and/or nervous system infarction; and/or aseptic necrosis of one or more joints. Finally, the patient was informed that Medicine is not an exact science; therefore, there is also the possibility of unforeseen or unpredictable risks and/or possible complications that may result in a catastrophic outcome. The patient indicated having understood very clearly. We have given the patient no guarantees and we have made no promises. Enough time was given to the patient to ask questions, all of which were answered to the patient's satisfaction. Julie Mckinney has indicated that she wanted to continue with the procedure. Attestation: I, the ordering provider,  attest that I have discussed with the patient the benefits, risks, side-effects, alternatives, likelihood of achieving goals, and potential problems during recovery for the procedure that I have provided informed consent. Date  Time: 08/08/2021 11:20 AM  Pre-Procedure Preparation:  Monitoring: As per clinic protocol. Respiration, ETCO2, SpO2, BP, heart rate and rhythm monitor placed and checked for adequate function Safety Precautions: Patient was assessed for positional comfort and pressure points before starting the procedure. Time-out: I initiated and conducted the "Time-out" before starting the procedure, as per protocol. The patient was asked to participate by confirming the accuracy of the "Time Out" information. Verification of the correct person, site, and procedure were performed and confirmed by me, the nursing staff, and the patient. "Time-out" conducted as per Joint Commission's Universal Protocol (UP.01.01.01). Time: 1158  Description of Procedure:          Target Area: Caudal Epidural Canal. Approach: Midline approach. Area Prepped: Entire Posterior Sacrococcygeal Region DuraPrep (Iodine Povacrylex [0.7% available iodine] and Isopropyl Alcohol, 74% w/w) Safety Precautions: Aspiration looking for blood return was conducted prior to all injections. At no point did we inject any substances, as a needle was being advanced. No attempts were made at seeking any paresthesias. Safe injection practices and needle disposal techniques used. Medications properly checked for expiration dates. SDV (single dose vial) medications used. Description of the Procedure: Protocol guidelines were followed. The patient was placed in position over the fluoroscopy table. The target area was identified and the area prepped in the usual manner. Skin & deeper tissues infiltrated with local anesthetic. Appropriate amount of time allowed to pass for local anesthetics to take effect. The procedure needles were then  advanced to the target area. Proper needle placement secured. Negative aspiration confirmed. Solution injected in intermittent fashion, asking for systemic symptoms every 0.5cc of injectate. The needles were then removed and the area cleansed, making sure to leave some of the prepping solution back to take advantage of its long term bactericidal properties. Vitals:   08/08/21 1132 08/08/21 1157 08/08/21 1202  BP: (!) 151/74 120/77 131/77  Pulse: 65  Resp: '16 18 16  '$ Temp: (!) 97 F (36.1 C)    SpO2: 91% 96% 96%  Weight: 97 lb (44 kg)    Height: 5' (1.524 m)      Start Time: 1158 hrs. End Time: 1202 hrs. Materials:  Tray: Epidural Needle(s) Type: Epidural needle Gauge (G): 22 Length: Regular (3.5-in) Qty: 1  5 cc solution made of 2 cc of preservative-free saline, 2 cc of 0.2% ropivacaine, 1 cc of Decadron 10 mg/cc.  Imaging Guidance (Spinal):          Type of Imaging Technique: Fluoroscopy Guidance (Spinal) Indication(s): Assistance in needle guidance and placement for procedures requiring needle placement in or near specific anatomical locations not easily accessible without such assistance. Exposure Time: Please see nurses notes. Contrast: Before injecting any contrast, we confirmed that the patient did not have an allergy to iodine, shellfish, or radiological contrast. Once satisfactory needle placement was completed at the desired level, radiological contrast was injected. Contrast injected under live fluoroscopy. No contrast complications. See chart for type and volume of contrast used. Fluoroscopic Guidance: I was personally present during the use of fluoroscopy. "Tunnel Vision Technique" used to obtain the best possible view of the target area. Parallax error corrected before commencing the procedure. "Direction-depth-direction" technique used to introduce the needle under continuous pulsed fluoroscopy. Once target was reached, antero-posterior, oblique, and lateral fluoroscopic  projection used confirm needle placement in all planes. Images permanently stored in EMR. Interpretation: I personally interpreted the imaging intraoperatively. Adequate needle placement confirmed in multiple planes. Appropriate spread of contrast into desired area was observed. No evidence of afferent or efferent intravascular uptake. No intrathecal or subarachnoid spread observed. Permanent images saved into the patient's record.  Antibiotic Prophylaxis:   Anti-infectives (From admission, onward)    None      Indication(s): None identified  Post-operative Assessment:  Post-procedure Vital Signs:  Pulse/HCG Rate: 6578 Temp:  (!) 97 F (36.1 C) Resp: 16 BP:  131/77 SpO2: 96 %  EBL: None  Complications: No immediate post-treatment complications observed by team, or reported by patient.  Note: The patient tolerated the entire procedure well. A repeat set of vitals were taken after the procedure and the patient was kept under observation following institutional policy, for this type of procedure. Post-procedural neurological assessment was performed, showing return to baseline, prior to discharge. The patient was provided with post-procedure discharge instructions, including a section on how to identify potential problems. Should any problems arise concerning this procedure, the patient was given instructions to immediately contact us, at any time, without hesitation. In any case, we plan to contact the patient by telephone for a follow-up status report regarding this interventional procedure.  Comments:  No additional relevant information.  Plan of Care  Orders:  Orders Placed This Encounter  Procedures   DG PAIN CLIN[facility] C-ARM 1-60 MIN NO REPORT    Intraoperative interpretation by procedural physician at [facility].    Standing Status:   Standing    Number of Occurrences:   1    Order Specific Question:   Reason for exam:    Answer:   Assistance in needle guidance  and placement for procedures requiring needle placement in or near specific anatomical locations not easily accessible without such assistance.   Chronic Opioid Analgesic:  Butrans patch 54mg/hr, continue tramadol 50 mg TID prn    Medications ordered for procedure: Meds ordered this encounter  Medications   iohexol (OMNIPAQUE) 180 MG/ML injection 10 mL  Must be Myelogram-compatible. If not available, you may substitute with a water-soluble, non-ionic, hypoallergenic, myelogram-compatible radiological contrast medium.   lidocaine (XYLOCAINE) 2 % (with pres) injection 400 mg   sodium chloride flush (NS) 0.9 % injection 2 mL   ropivacaine (PF) 2 mg/mL (0.2%) (NAROPIN) injection 2 mL   dexamethasone (DECADRON) injection 10 mg   Medications administered: We administered iohexol, lidocaine, sodium chloride flush, ropivacaine (PF) 2 mg/mL (0.2%), and dexamethasone.  See the medical record for exact dosing, route, and time of administration.  Follow-up plan:   Return in about 4 weeks (around 09/05/2021) for Post Procedure Evaluation, virtual.     Recent Visits Date Type Provider Dept  07/11/21 Office Visit Gillis Santa, MD [facility]  06/25/21 Procedure visit Gillis Santa, MD [facility]  06/12/21 Office Visit Gillis Santa, MD [facility]  05/17/21 Office Visit Gillis Santa, MD [facility]  Showing recent visits within past 90 days and meeting all other requirements Today's Visits Date Type Provider Dept  08/08/21 Procedure visit Gillis Santa, MD [facility]  Showing today's visits and meeting all other requirements Future Appointments Date Type Provider Dept  09/05/21 Appointment Gillis Santa, MD [facility]  Showing future appointments within next 90 days and meeting all other requirements  Disposition: Discharge home  Discharge (Date  Time): 08/08/2021; 1212 hrs.   Primary Care Physician: Gladstone Lighter,  MD Location: [facility] Note by: Gillis Santa, MD Date: 08/08/2021; Time: 2:10 PM  Disclaimer:  Medicine is not an exact science. The only guarantee in medicine is that nothing is guaranteed. It is important to note that the decision to proceed with this intervention was based on the information collected from the patient. The Data and conclusions were drawn from the patient's questionnaire, the interview, and the physical examination. Because the information was provided in large part by the patient, it cannot be guaranteed that it has not been purposely or unconsciously manipulated. Every effort has been made to obtain as much relevant data as possible for this evaluation. It is important to note that the conclusions that lead to this procedure are derived in large part from the available data. Always take into account that the treatment will also be dependent on availability of resources and existing treatment guidelines, considered by other Pain Management Practitioners as being common knowledge and practice, at the time of the intervention. For Medico-Legal purposes, it is also important to point out that variation in procedural techniques and pharmacological choices are the acceptable norm. The indications, contraindications, technique, and results of the above procedure should only be interpreted and judged by a Board-Certified Interventional Pain Specialist with extensive familiarity and expertise in the same exact procedure and technique.

## 2021-08-08 NOTE — Progress Notes (Signed)
Safety precautions to be maintained throughout the outpatient stay will include: orient to surroundings, keep bed in low position, maintain call bell within reach at all times, provide assistance with transfer out of bed and ambulation.  

## 2021-08-09 ENCOUNTER — Telehealth: Payer: Self-pay

## 2021-08-09 NOTE — Telephone Encounter (Signed)
Post procedure follow up.  Patient states she is doing good.  

## 2021-08-22 ENCOUNTER — Emergency Department
Admission: EM | Admit: 2021-08-22 | Discharge: 2021-08-22 | Disposition: A | Discharge status: Home / Self Care | Payer: Medicare Other | Attending: Emergency Medicine | Admitting: Emergency Medicine

## 2021-08-22 ENCOUNTER — Emergency Department: Payer: Medicare Other

## 2021-08-22 ENCOUNTER — Other Ambulatory Visit: Payer: Self-pay

## 2021-08-22 ENCOUNTER — Encounter: Payer: Self-pay | Admitting: Emergency Medicine

## 2021-08-22 DIAGNOSIS — Y92002 Bathroom of unspecified non-institutional (private) residence single-family (private) house as the place of occurrence of the external cause: Secondary | ICD-10-CM | POA: Insufficient documentation

## 2021-08-22 DIAGNOSIS — S0240FA Zygomatic fracture, left side, initial encounter for closed fracture: Secondary | ICD-10-CM | POA: Insufficient documentation

## 2021-08-22 DIAGNOSIS — S0181XA Laceration without foreign body of other part of head, initial encounter: Secondary | ICD-10-CM | POA: Diagnosis not present

## 2021-08-22 DIAGNOSIS — S0990XA Unspecified injury of head, initial encounter: Secondary | ICD-10-CM | POA: Insufficient documentation

## 2021-08-22 DIAGNOSIS — W01198A Fall on same level from slipping, tripping and stumbling with subsequent striking against other object, initial encounter: Secondary | ICD-10-CM | POA: Insufficient documentation

## 2021-08-22 DIAGNOSIS — S0993XA Unspecified injury of face, initial encounter: Secondary | ICD-10-CM | POA: Diagnosis present

## 2021-08-22 NOTE — ED Provider Triage Note (Signed)
Emergency Medicine Provider Triage Evaluation Note  Julie Mckinney , a 87 y.o. female  was evaluated in triage.  Pt complains of head injury.  Patient referred ports that she fell around noon in the bathroom hitting her the left side of her face on the bathtub.  She denies any loss of consciousness.  Review of Systems  Positive: Left-sided facial pain, +laceration. Negative: No other injuries  Physical Exam  BP (!) 126/58 (BP Location: Left Arm)   Pulse 74   Temp 98.9 F (37.2 C) (Oral)   Resp 16   Ht 5' (1.524 m)   Wt 43.5 kg   SpO2 95%   BMI 18.75 kg/m  Gen:   Awake, no distress, talkative, alert. Resp:  Normal effort lungs are clear. MSK:   Moves extremities without difficulty moves both upper and lower extremities without any difficulty. Other:  Left periorbital area slightly ecchymotic.  No dental injury noted.  Laceration to the left facial area above left eyebrow.  Medical Decision Making  Medically screening exam initiated at 2:25 PM.  Appropriate orders placed.  Julie Mckinney was informed that the remainder of the evaluation will be completed by another provider, this initial triage assessment does not replace that evaluation, and the importance of remaining in the ED until their evaluation is complete.     Johnn Hai, PA-C 08/22/21 1425

## 2021-08-22 NOTE — ED Provider Notes (Signed)
[facility] Provider Note   CSN: [id number] Arrival date & time: 08/22/21  1314     History  Chief Complaint  Patient presents with   Fall   Head Injury    Julie Mckinney is a 87 y.o. female.  Presents to the emergency department evaluation of a fall.  Just prior to arrival patient was going into the bathroom, she excellently tripped, fell forward and hit her left forehead and facial bones along the tub.  No LOC, nausea or vomiting.  Initially had a mild headache but this is resolved.  She denies any neck pain, back pain, lower back, abdominal, hip or knee pain.  Patient is not on any type of blood thinner.  She states she has been ambulatory since the accident.  She had significant bleeding from the left superior orbital laceration.  She denies any vision changes or pain with extraocular movement.  HPI     Home Medications Prior to Admission medications   Medication Sig Start Date End Date Taking? Authorizing Provider  acetaminophen (TYLENOL) 500 MG tablet Take by mouth.    [provider]  albuterol (PROVENTIL HFA;VENTOLIN HFA) 108 (90 Base) MCG/ACT inhaler Inhale 2 puffs into the lungs every 6 (six) hours as needed.     [provider]  atorvastatin (LIPITOR) 40 MG tablet TAKE ONE TABLET BY MOUTH EVERY EVENING 03/05/19   [provider]  buprenorphine (BUTRANS) 20 MCG/HR PTWK Place 1 patch onto the skin once a week. 06/12/21 09/04/21  Gillis Santa, MD  busPIRone (BUSPAR) 7.5 MG tablet Take 1 tablet (7.5 mg total) by mouth 2 (two) times daily. 03/06/21   Ursula Alert, MD  calcium carbonate (OS-CAL) 600 MG TABS tablet Take 600 mg by mouth 2 (two) times daily.    [provider]  citalopram (CELEXA) 20 MG tablet Take 1.5 tablets (30 mg total) by mouth daily. 03/06/21   Ursula Alert, MD  clobetasol cream (TEMOVATE) 0.05 % Apply topically. Patient not taking: Reported on 08/08/2021 02/04/20   [provider]  Fluticasone-Umeclidin-Vilant (TRELEGY ELLIPTA) 100-62.5-25 MCG/INH AEPB Inhale 1 puff into the lungs daily.    [provider]  GLUCOSAMINE SULFATE PO Take 1 capsule by mouth 2 (two) times daily.    [provider]  levothyroxine (SYNTHROID, LEVOTHROID) 50 MCG tablet Take 50 mcg by mouth daily.    [provider]  Multiple Vitamin (MULTI-VITAMINS) TABS Take 1 tablet by mouth daily.    [provider]  Nebulizers MISC Inhale 1 Dose into the lungs as needed.     [provider]  omeprazole (PRILOSEC) 20 MG capsule Take 20 mg by mouth daily.    [provider]  Potassium 99 MG TABS Take 1 tablet by mouth daily.     [provider]  tolterodine (DETROL LA) 2 MG 24 hr capsule Take 2 mg by mouth daily. 12/02/20   [provider]  traMADol (ULTRAM) 50 MG tablet Take 1 tablet (50 mg total) by mouth every 8 (eight) hours as needed for severe pain. 06/12/21 09/10/21  Gillis Santa, MD  vitamin B-12 (CYANOCOBALAMIN) 1000 MCG tablet Take 1,000 mcg by mouth daily.    [provider]      Allergies    Tape    Review of Systems   Review of Systems  Physical Exam Updated Vital Signs BP (!) 126/58 (BP Location: Left Arm)   Pulse 74   Temp 98.9 F (37.2 C) (Oral)  Resp 16   Ht 5' (1.524 m)   Wt 43.5 kg   SpO2 95%   BMI 18.75 kg/m  Physical Exam Constitutional:      Appearance: She is well-developed.  HENT:     Head: Normocephalic.     Comments: 3 cm linear laceration left superior lateral orbital rim region.  Normal EOM.  No active bleeding.  No swelling.  She has bruising to the left zygoma region with tenderness to palpation.  No skin breakdown noted.  Patient has normal neck range of motion with no tenderness along cervical spine.  She has no pain with passive hip or knee range of motion. Eyes:     Conjunctiva/sclera: Conjunctivae normal.  Cardiovascular:     Rate and Rhythm: Normal rate.   Pulmonary:     Effort: Pulmonary effort is normal. No respiratory distress.  Abdominal:     General: There is no distension.     Tenderness: There is no abdominal tenderness. There is no guarding.  Musculoskeletal:        General: Normal range of motion.     Cervical back: Normal range of motion.  Skin:    General: Skin is warm.     Findings: No rash.  Neurological:     General: No focal deficit present.     Mental Status: She is alert and oriented to person, place, and time. Mental status is at baseline.     Motor: No weakness.     Gait: Gait normal.  Psychiatric:        Behavior: Behavior normal.        Thought Content: Thought content normal.     ED Results / Procedures / Treatments   Labs (all labs ordered are listed, but only abnormal results are displayed) Labs Reviewed - No data to display  EKG None  Radiology CT Head Wo Contrast  Result Date: 08/22/2021 CLINICAL DATA:  Polytrauma, blunt; Facial trauma, blunt fell, hit bathtub EXAM: CT HEAD WITHOUT CONTRAST CT MAXILLOFACIAL WITHOUT CONTRAST CT CERVICAL SPINE WITHOUT CONTRAST TECHNIQUE: Multidetector CT imaging of the head, cervical spine, and maxillofacial structures were performed using the standard protocol without intravenous contrast. Multiplanar CT image reconstructions of the cervical spine and maxillofacial structures were also generated. RADIATION DOSE REDUCTION: This exam was performed according to the departmental dose-optimization program which includes automated exposure control, adjustment of the mA and/or kV according to patient size and/or use of iterative reconstruction technique. COMPARISON:  CT head July 14, 2019. CT cervical spine 01/17/2019 FINDINGS: CT HEAD FINDINGS Brain: No evidence of acute infarction, hemorrhage, hydrocephalus, extra-axial collection or mass lesion/mass effect. 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 Cerebral atrophy. Vascular: No hyperdense vessel. Skull: No  acute fracture. Other: No mastoid effusions. CT MAXILLOFACIAL FINDINGS Osseous: Mild deformity of the left psych Oma, which appears new since 2020 and potentially acute. TMJ degenerative change Orbits: Negative. No traumatic or inflammatory finding. Sinuses: Left maxillary sinus air-fluid level. Otherwise, largely clear. Soft tissues: Negative. CT CERVICAL SPINE FINDINGS Alignment: Similar anterolisthesis of C3 on C4 and C4 on C5. No new sagittal subluxation. Skull base and vertebrae: Vertebral body heights are maintained. No evidence of acute fracture. Soft tissues and spinal canal: No prevertebral fluid or swelling. No visible canal hematoma. Disc levels: Similar multilevel degenerative disc disease, greatest in lower cervical spine. Similar multilevel facet and uncovertebral hypertrophy with varying degrees of neural foraminal stenosis. Upper chest: Visualized lung apices are clear. IMPRESSION: CT head: 1. No evidence  of acute intracranial abnormality. 2. 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 CT maxillofacial: Mild deformity of the left psych Oma, which appears new since 2020 and potentially acute. Correlate with the presence or absence of point tenderness. CT cervical spine: No evidence of acute fracture or traumatic malalignment. Electronically Signed   By: Margaretha Sheffield M.D.   On: 08/22/2021 15:03   CT Cervical Spine Wo Contrast  Result Date: 08/22/2021 CLINICAL DATA:  Polytrauma, blunt; Facial trauma, blunt fell, hit bathtub EXAM: CT HEAD WITHOUT CONTRAST CT MAXILLOFACIAL WITHOUT CONTRAST CT CERVICAL SPINE WITHOUT CONTRAST TECHNIQUE: Multidetector CT imaging of the head, cervical spine, and maxillofacial structures were performed using the standard protocol without intravenous contrast. Multiplanar CT image reconstructions of the cervical spine and maxillofacial structures were also generated. RADIATION DOSE REDUCTION: This exam was performed according to the departmental  dose-optimization program which includes automated exposure control, adjustment of the mA and/or kV according to patient size and/or use of iterative reconstruction technique. COMPARISON:  CT head July 14, 2019. CT cervical spine 01/17/2019 FINDINGS: CT HEAD FINDINGS Brain: No evidence of acute infarction, hemorrhage, hydrocephalus, extra-axial collection or mass lesion/mass effect. 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 Cerebral atrophy. Vascular: No hyperdense vessel. Skull: No acute fracture. Other: No mastoid effusions. CT MAXILLOFACIAL FINDINGS Osseous: Mild deformity of the left psych Oma, which appears new since 2020 and potentially acute. TMJ degenerative change Orbits: Negative. No traumatic or inflammatory finding. Sinuses: Left maxillary sinus air-fluid level. Otherwise, largely clear. Soft tissues: Negative. CT CERVICAL SPINE FINDINGS Alignment: Similar anterolisthesis of C3 on C4 and C4 on C5. No new sagittal subluxation. Skull base and vertebrae: Vertebral body heights are maintained. No evidence of acute fracture. Soft tissues and spinal canal: No prevertebral fluid or swelling. No visible canal hematoma. Disc levels: Similar multilevel degenerative disc disease, greatest in lower cervical spine. Similar multilevel facet and uncovertebral hypertrophy with varying degrees of neural foraminal stenosis. Upper chest: Visualized lung apices are clear. IMPRESSION: CT head: 1. No evidence of acute intracranial abnormality. 2. 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 CT maxillofacial: Mild deformity of the left psych Oma, which appears new since 2020 and potentially acute. Correlate with the presence or absence of point tenderness. CT cervical spine: No evidence of acute fracture or traumatic malalignment. Electronically Signed   By: Margaretha Sheffield M.D.   On: 08/22/2021 15:03   CT MAXILLOFACIAL WO CONTRAST  Result Date: 08/22/2021 CLINICAL DATA:   Polytrauma, blunt; Facial trauma, blunt fell, hit bathtub EXAM: CT HEAD WITHOUT CONTRAST CT MAXILLOFACIAL WITHOUT CONTRAST CT CERVICAL SPINE WITHOUT CONTRAST TECHNIQUE: Multidetector CT imaging of the head, cervical spine, and maxillofacial structures were performed using the standard protocol without intravenous contrast. Multiplanar CT image reconstructions of the cervical spine and maxillofacial structures were also generated. RADIATION DOSE REDUCTION: This exam was performed according to the departmental dose-optimization program which includes automated exposure control, adjustment of the mA and/or kV according to patient size and/or use of iterative reconstruction technique. COMPARISON:  CT head July 14, 2019. CT cervical spine 01/17/2019 FINDINGS: CT HEAD FINDINGS Brain: No evidence of acute infarction, hemorrhage, hydrocephalus, extra-axial collection or mass lesion/mass effect. 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 Cerebral atrophy. Vascular: No hyperdense vessel. Skull: No acute fracture. Other: No mastoid effusions. CT MAXILLOFACIAL FINDINGS Osseous: Mild deformity of the left psych Oma, which appears new since 2020 and potentially acute. TMJ degenerative change Orbits: Negative. No  traumatic or inflammatory finding. Sinuses: Left maxillary sinus air-fluid level. Otherwise, largely clear. Soft tissues: Negative. CT CERVICAL SPINE FINDINGS Alignment: Similar anterolisthesis of C3 on C4 and C4 on C5. No new sagittal subluxation. Skull base and vertebrae: Vertebral body heights are maintained. No evidence of acute fracture. Soft tissues and spinal canal: No prevertebral fluid or swelling. No visible canal hematoma. Disc levels: Similar multilevel degenerative disc disease, greatest in lower cervical spine. Similar multilevel facet and uncovertebral hypertrophy with varying degrees of neural foraminal stenosis. Upper chest: Visualized lung apices are clear. IMPRESSION: CT head: 1.  No evidence of acute intracranial abnormality. 2. 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 CT maxillofacial: Mild deformity of the left psych Oma, which appears new since 2020 and potentially acute. Correlate with the presence or absence of point tenderness. CT cervical spine: No evidence of acute fracture or traumatic malalignment. Electronically Signed   By: Margaretha Sheffield M.D.   On: 08/22/2021 15:03    Procedures .Marland KitchenLaceration Repair  Date/Time: 08/22/2021 4:53 PM  Performed by: Duanne Guess, PA-C Authorized by: Duanne Guess, PA-C   Consent:    Consent obtained:  Verbal   Consent given by:  Patient   Alternatives discussed:  No treatment Universal protocol:    Patient identity confirmed:  Verbally with patient Exploration:    Limited defect created (wound extended): no     Contaminated: no   Treatment:    Area cleansed with:  Povidone-iodine and saline   Amount of cleaning:  Standard   Debridement:  None Skin repair:    Repair method:  Tissue adhesive Approximation:    Approximation:  Close Repair type:    Repair type:  Simple Post-procedure details:    Dressing:  Open (no dressing)     Medications Ordered in ED Medications - No data to display  ED Course/ Medical Decision Making/ A&P                           Medical Decision Making  87 year old female with fall and trauma to the forehead and left-sided facial bones.  She has a laceration to the left forehead which was cleansed and repaired with Dermabond.  No significant swelling.  She underwent CT of the head maxillofacial and cervical spine, these images were independently reviewed by me today and do show evidence of an acute zygoma fracture consistent with exam findings.  Zygoma fracture is nondisplaced, will have patient follow-up with ENT.  She will apply ice to the area to help with swelling and will take Tylenol and tramadol for pain.  She has no sign of muscle entrapment.  No  headache nausea vomiting or LOC.  No other injury to her lower extremities Final Clinical Impression(s) / ED Diagnoses Final diagnoses:  Closed fracture of left zygomatic arch, initial encounter ([place])  Facial laceration, initial encounter  Minor head injury, initial encounter    Rx / DC Orders ED Discharge Orders     None         Renata Caprice 08/22/21 [id number]    Carrie Mew, MD 08/22/21 [id number]

## 2021-08-22 NOTE — Discharge Instructions (Addendum)
Please apply ice to areas of pain and swelling 20 minutes every hour over the next 2 to 3 days.  You may use Tylenol, tramadol as needed for pain.  Return to the ER for any vision changes, headaches, vomiting, worsening symptoms or any urgent changes in your health.  Please keep Dermabond clean and dry for 5 days after 5 days you may shower and get wet and allow Dermabond to come off on its own.

## 2021-08-22 NOTE — ED Triage Notes (Signed)
First Nurse Note:  Pt via EMS from home. Pt went to to turn light in the bathroom, states that caused her to fall. Pt has lac approx 1 inch above L eye. Bleeding controlled. Denies thinners. Thinks she may have hit her head on the bathtub. Pt is A&OX4 and NAD  135/72 98% on RA 87 HR

## 2021-08-22 NOTE — ED Triage Notes (Signed)
Patient with husband reports falling around noon today. States patient was going into the bathroom to turn the light on and fell hitting left side of her face on the bathtub. No LOC and no blood thinners. Denies any vision problems.

## 2021-09-03 ENCOUNTER — Encounter: Payer: Self-pay | Admitting: Psychiatry

## 2021-09-03 ENCOUNTER — Ambulatory Visit (INDEPENDENT_AMBULATORY_CARE_PROVIDER_SITE_OTHER): Payer: Medicare Other | Admitting: Psychiatry

## 2021-09-03 VITALS — BP 131/73 | HR 98 | Temp 98.5°F | Wt 98.0 lb

## 2021-09-03 DIAGNOSIS — Z79899 Other long term (current) drug therapy: Secondary | ICD-10-CM | POA: Diagnosis not present

## 2021-09-03 DIAGNOSIS — F3342 Major depressive disorder, recurrent, in full remission: Secondary | ICD-10-CM | POA: Diagnosis not present

## 2021-09-03 DIAGNOSIS — F411 Generalized anxiety disorder: Secondary | ICD-10-CM | POA: Diagnosis not present

## 2021-09-03 DIAGNOSIS — G4701 Insomnia due to medical condition: Secondary | ICD-10-CM | POA: Diagnosis not present

## 2021-09-03 MED ORDER — BUSPIRONE HCL 7.5 MG PO TABS: 7.5000 mg | ORAL_TABLET | Freq: Two times a day (BID) | ORAL | 3 refills | Status: AC

## 2021-09-03 MED ORDER — CITALOPRAM HYDROBROMIDE 20 MG PO TABS: 30.0000 mg | ORAL_TABLET | Freq: Every day | ORAL | 3 refills | Status: AC

## 2021-09-03 NOTE — Patient Instructions (Signed)
Please call for EKG-[id number] 

## 2021-09-03 NOTE — Progress Notes (Unsigned)
[facility] MD [facility] Progress Note  09/03/2021 6:27 PM Julie Mckinney  MRN:  [id number]  Chief Complaint:  Chief Complaint  Patient presents with   Follow-up: 87 year old Caucasian female with history of MDD, GAD, COPD, presented for medication management.   HPI: Julie Mckinney is a 87 year old Caucasian female, lives in a senior living community in [place], married, has a history of MDD, GAD, COPD, chronic pain, rheumatoid arthritis, history of breast cancer in remission on tamoxifen, chronic pain was evaluated  Patient today appeared to be alert, pleasant in session .  Patient reports she recently had a fall this happened a few days ago.  She was seen in the emergency department.  Patient with multiple superficial lacerations on her face, left sided as well as her forearm-healing.  Patient was seen in the emergency department-on 08/22/2021.  "Reviewed notes per Mr.Gaines PA -patient underwent CT scan of her head, maxillofacial and cervical spine.  Patient with acute zygoma fracture nondisplaced.  Patient was advised to follow up with ENT.  Also was provided Tylenol, tramadol for pain."  Patient currently reports pain as better.  Patient reports overall mood symptoms are stable.  Denies any significant anxiety or depressive symptoms.  Currently compliant on the Celexa, BuSpar.  Denies side effects.  Reports overall sleep is good.  Continues to follow-up with pain management.  Does have good support system from spouse.  Patient reports she is planning to relocate to [place] to be closer to her son and her niece.  She looks forward to that since she will be closer to family.  Denies any suicidality, homicidality or perceptual disturbances.  Patient denies any other concerns today.      Visit Diagnosis:    ICD-10-CM   1. MDD (major depressive disorder), recurrent, in full remission ([place])  F33.42 citalopram (CELEXA) 20 MG tablet    2. GAD (generalized anxiety disorder)  F41.1 busPIRone  (BUSPAR) 7.5 MG tablet    citalopram (CELEXA) 20 MG tablet    3. Insomnia due to medical condition  G47.01    Pain    4. High risk medication use  Z79.899 EKG 12-Lead      Past Psychiatric History: Reviewed past psychiatric history from progress note on 10/23/2017.  Past trials of Celexa, mirtazapine, melatonin.  Past Medical History:  Past Medical History:  Diagnosis Date   Anxiety    Asthma    Breast cancer ([place]) Right   COPD (chronic obstructive pulmonary disease) ([place])    Depression    Personal history of radiation therapy     Past Surgical History:  Procedure Laterality Date   ABDOMINAL HYSTERECTOMY     APPENDECTOMY     BACK SURGERY     BREAST BIOPSY Right 2015   +   BREAST EXCISIONAL BIOPSY Right 1975   neg   BREAST LUMPECTOMY Right 2015   invasive lobular carcinoma   CHOLECYSTECTOMY     COLONOSCOPY     Scheduled for July 2018   COLONOSCOPY WITH PROPOFOL N/A 08/14/2016   Procedure: COLONOSCOPY WITH PROPOFOL;  Surgeon: Manya Silvas, MD;  Location[facility] ENDOSCOPY;  Service: Endoscopy;  Laterality: N/A;   ESOPHAGOGASTRODUODENOSCOPY (EGD) WITH PROPOFOL N/A 08/14/2016   Procedure: ESOPHAGOGASTRODUODENOSCOPY (EGD) WITH PROPOFOL;  Surgeon: Manya Silvas, MD;  Location: [facility] ENDOSCOPY;  Service: Endoscopy;  Laterality: N/A;    Family Psychiatric History: Family psychiatric history from progress note on 10/23/2017.  Family History:  Family History  Problem Relation Age of Onset   Cancer Father  Breast cancer Neg Hx     Social History: Reviewed social history from progress note on 10/23/2017. Social History   Socioeconomic History   Marital status: Married    Spouse name: ronald   Number of children: 2   Years of education: Not on file   Highest education level: Some college, no degree  Occupational History   Not on file  Tobacco Use   Smoking status: Former    Packs/day: 0.50    Years: 30.00    Total pack years: 15.00    Types: Cigarettes    Quit  date: 06/22/1995    Years since quitting: 26.2   Smokeless tobacco: Never  Vaping Use   Vaping Use: Never used  Substance and Sexual Activity   Alcohol use: Yes    Alcohol/week: 1.0 standard drink of alcohol    Types: 1 Glasses of wine per week    Comment: Occasional-rare   Drug use: No   Sexual activity: Not Currently  Other Topics Concern   Not on file  Social History Narrative   Not on file   Social Determinants of Health   Financial Resource Strain: Low Risk  (10/23/2017)   Overall Financial Resource Strain (CARDIA)    Difficulty of Paying Living Expenses: Not hard at all  Food Insecurity: No Food Insecurity (10/23/2017)   Hunger Vital Sign    Worried About Running Out of Food in the Last Year: Never true    Ran Out of Food in the Last Year: Never true  Transportation Needs: No Transportation Needs (10/23/2017)   PRAPARE - Hydrologist (Medical): No    Lack of Transportation (Non-Medical): No  Physical Activity: Inactive (10/23/2017)   Exercise Vital Sign    Days of Exercise per Week: 0 days    Minutes of Exercise per Session: 0 min  Stress: Stress Concern Present (10/23/2017)   [place]    Feeling of Stress : Very much  Social Connections: Unknown (10/23/2017)   Social Connection and Isolation Panel [NHANES]    Frequency of Communication with Friends and Family: Not on file    Frequency of Social Gatherings with Friends and Family: Not on file    Attends Religious Services: Never    Active Member of Clubs or Organizations: No    Attends Archivist Meetings: Never    Marital Status: Married    Allergies:  Allergies  Allergen Reactions   Tape Rash    Metabolic Disorder Labs: No results found for: "HGBA1C", "MPG" No results found for: "PROLACTIN" No results found for: "CHOL", "TRIG", "HDL", "CHOLHDL", "VLDL", "LDLCALC" No results found for: "TSH"  Therapeutic  Level Labs: No results found for: "LITHIUM" No results found for: "VALPROATE" No results found for: "CBMZ"  Current Medications: Current Outpatient Medications  Medication Sig Dispense Refill   acetaminophen (TYLENOL) 500 MG tablet Take by mouth.     albuterol (PROVENTIL HFA;VENTOLIN HFA) 108 (90 Base) MCG/ACT inhaler Inhale 2 puffs into the lungs every 6 (six) hours as needed.      buprenorphine (BUTRANS) 20 MCG/HR PTWK Place 1 patch onto the skin once a week. 4 patch 2   calcium carbonate (OS-CAL) 600 MG TABS tablet Take 600 mg by mouth 2 (two) times daily.     clobetasol cream (TEMOVATE) 0.05 % Apply topically.     Fluticasone-Umeclidin-Vilant (TRELEGY ELLIPTA) 100-62.5-25 MCG/INH AEPB Inhale 1 puff into the lungs daily.  GLUCOSAMINE SULFATE PO Take 1 capsule by mouth 2 (two) times daily.     levothyroxine (SYNTHROID, LEVOTHROID) 50 MCG tablet Take 50 mcg by mouth daily.     Multiple Vitamin (MULTI-VITAMINS) TABS Take 1 tablet by mouth daily.     Nebulizers MISC Inhale 1 Dose into the lungs as needed.      Potassium 99 MG TABS Take 1 tablet by mouth daily.      tolterodine (DETROL LA) 2 MG 24 hr capsule Take 2 mg by mouth daily.     traMADol (ULTRAM) 50 MG tablet Take 1 tablet (50 mg total) by mouth every 8 (eight) hours as needed for severe pain. 90 tablet 2   vitamin B-12 (CYANOCOBALAMIN) 1000 MCG tablet Take 1,000 mcg by mouth daily.     busPIRone (BUSPAR) 7.5 MG tablet Take 1 tablet (7.5 mg total) by mouth 2 (two) times daily. 180 tablet 3   citalopram (CELEXA) 20 MG tablet Take 1.5 tablets (30 mg total) by mouth daily. 135 tablet 3   No current facility-administered medications for this visit.     Musculoskeletal: Strength & Muscle Tone: within normal limits Gait & Station:  walks with cane  Patient leans: N/A  Psychiatric Specialty Exam: Review of Systems  Musculoskeletal:  Positive for back pain (Chronic).  Skin:        Healing laceration-left side of her face  lateral side of her eyes. Status post zygoma fracture Dressing on left forearm-status post fall with laceration.  All other systems reviewed and are negative.   Blood pressure 131/73, pulse 98, temperature 98.5 F (36.9 C), temperature source Temporal, weight 98 lb (44.5 kg).Body mass index is 19.14 kg/m.  General Appearance: Casual  Eye Contact:  Fair  Speech:  Clear and Coherent  Volume:  Normal  Mood:  Euthymic  Affect:  Congruent  Thought Process:  Goal Directed and Descriptions of Associations: Intact  Orientation:  Full (Time, Place, and Person)  Thought Content: Logical   Suicidal Thoughts:  No  Homicidal Thoughts:  No  Memory:  Immediate;   Fair Recent;   Fair Remote;   limited  Judgement:  Fair  Insight:  Fair  Psychomotor Activity:  Normal  Concentration:  Concentration: Fair and Attention Span: Fair  Recall:  [employer] of Knowledge: Fair  Language: Fair  Akathisia:  No  Handed:  Right  AIMS (if indicated): done  Assets:  Communication Skills Desire for Improvement Housing Intimacy Social Support  ADL's:  Intact  Cognition: WNL  Sleep:  Fair   Screenings: GAD-7    Flowsheet Row Office Visit from 09/03/2021 in [facility] Video Visit from 05/11/2020 in [facility]  Total GAD-7 Score 5 4      PHQ2-9    [place] Visit from 09/03/2021 in [facility] Procedure visit from 08/08/2021 in [facility] Office Visit from 06/12/2021 in [facility] Office Visit from 03/06/2021 in [facility] Office Visit from 01/30/2021 in [facility]  PHQ-2 Total Score 0 0 1 2 0  PHQ-9 Total Score 1 -- -- 4 --      [place] Office Visit from 09/03/2021 in [facility] ED from 08/22/2021 in [facility] Office Visit from 03/06/2021 in [facility] No Risk No Risk No Risk  Assessment and Plan: Sharonda Llamas is a 87 year old female, married, lives in an independent senior living community in [place], has a history of MDD, GAD, chronic pain, insomnia was evaluated in office today.  Patient is currently stable.  Plan MDD in full remission Celexa 30 mg p.o. daily  GAD-stable BuSpar 7.5 mg p.o. twice daily Celexa 30 mg p.o. daily  Insomnia-stable Continue sleep hygiene techniques Continue sufficient pain management.  At risk for prolonged QT syndrome-patient is on medications like Celexa, EKG ordered.  Provide manage phone [phone].   For recent fall and zygoma fracture-patient to continue to follow up with primary care provider.  Reviewed notes per Dr. Rosalyn Gess 08/30/2021 as well as emergency department visit-08/21/2021-as noted above.  Follow-up in clinic in 4 to 5 months or sooner if needed.   This note was generated in part or whole with voice recognition software. Voice recognition is usually quite accurate but there are transcription errors that can and very often do occur. I apologize for any typographical errors that were not detected and corrected.     Ursula Alert, MD 09/03/2021, 6:27 PM

## 2021-09-04 ENCOUNTER — Encounter: Payer: Medicare Other | Admitting: Student in an Organized Health Care Education/Training Program

## 2021-09-04 ENCOUNTER — Ambulatory Visit
Payer: Medicare Other | Attending: Student in an Organized Health Care Education/Training Program | Admitting: Student in an Organized Health Care Education/Training Program

## 2021-09-04 ENCOUNTER — Encounter: Payer: Self-pay | Admitting: Student in an Organized Health Care Education/Training Program

## 2021-09-04 VITALS — BP 105/92 | HR 91 | Temp 98.8°F | Resp 16 | Ht 60.0 in | Wt 98.0 lb

## 2021-09-04 DIAGNOSIS — M5416 Radiculopathy, lumbar region: Secondary | ICD-10-CM | POA: Insufficient documentation

## 2021-09-04 DIAGNOSIS — G8929 Other chronic pain: Secondary | ICD-10-CM | POA: Insufficient documentation

## 2021-09-04 DIAGNOSIS — G894 Chronic pain syndrome: Secondary | ICD-10-CM | POA: Insufficient documentation

## 2021-09-04 DIAGNOSIS — G5702 Lesion of sciatic nerve, left lower limb: Secondary | ICD-10-CM | POA: Insufficient documentation

## 2021-09-04 DIAGNOSIS — M961 Postlaminectomy syndrome, not elsewhere classified: Secondary | ICD-10-CM | POA: Insufficient documentation

## 2021-09-04 MED ORDER — TRAMADOL HCL 50 MG PO TABS: 50.0000 mg | ORAL_TABLET | Freq: Three times a day (TID) | ORAL | 2 refills | Status: AC | PRN

## 2021-09-04 MED ORDER — BUPRENORPHINE 20 MCG/HR TD PTWK: 1.0000 | MEDICATED_PATCH | TRANSDERMAL | 2 refills | Status: AC

## 2021-09-04 NOTE — Progress Notes (Signed)
PROVIDER NOTE: Information contained herein reflects review and annotations entered in association with encounter. Interpretation of such information and data should be left to medically-trained personnel. Information provided to patient can be located elsewhere in the medical record under "Patient Instructions". Document created using STT-dictation technology, any transcriptional errors that may result from process are unintentional.    Patient: Julie Mckinney  Service Category: E/M  Provider: Gillis Santa, MD  DOB: 1934/07/05  DOS: 09/04/2021  Referring Provider: Gladstone Lighter, MD  MRN: [id number]  Specialty: Interventional Pain Management  PCP: Gladstone Lighter, MD  Type: Established Patient  Setting: Ambulatory outpatient    Location: Office  Delivery: Face-to-face     HPI  Julie Mckinney, a 87 y.o. year old female, is here today because of her Chronic radicular lumbar pain [M54.16, G89.29]. Julie Mckinney's primary complain today is Back Pain Last encounter: My last encounter with her was on 08/08/2021. Pertinent problems: Julie Mckinney has History of CVA (cerebrovascular accident) without residual deficits; MDD (major depressive disorder), recurrent episode, mild ([place]); T10 vertebral fracture ([place]); Closed stable burst fracture of T12 vertebra ([place]); Lumbar post-laminectomy syndrome; MDD (major depressive disorder), recurrent, in full remission ([place]); Pain management contract signed; and Chronic pain syndrome on their pertinent problem list. Pain Assessment: Severity of Chronic pain is reported as a 8 /10. Location: Back Lower/radiates down left leg to foot. Onset: More than a month ago. Quality: Aching, Constant. Timing: Constant. Modifying factor(s): rest, meds. Vitals:  height is 5' (1.524 m) and weight is 98 lb (44.5 kg). Her temporal temperature is 98.8 F (37.1 C). Her blood pressure is 105/92 (abnormal) and her pulse is 91. Her respiration is 16 and oxygen saturation is 98%.    Reason for encounter: both, medication management and post-procedure evaluation and assessment.   Raegen presents today for medication management and postprocedural evaluation after her caudal epidural steroid injection that was done 08/08/2021. She states that the caudal injection provided about 75% pain relief for the first 2 weeks but unfortunately she sustained a fall resulting in a closed fracture of her left zygomatic arch.  She also had a laceration of her left eye which was dermabonded. Her left facial injury after her fall is healing.  She is pretty bruised on the left side of her face.  Dermabond is present above her left eye. She states that since her fall, she has been having increased radicular pain and would like a repeat lumbar epidural steroid injection.  She is also having tenderness and severe pain overlying her left piriformis.  We discussed a left piriformis trigger point injection as well. I will refill her medications as below, no change in dose. She states that she will be moving to [place] in January to be closer to her son.    Pharmacotherapy Assessment  Analgesic: Butrans patch 74mg/hr, continue tramadol 50 mg TID prn    Monitoring: [place] PMP: PDMP not reviewed this encounter.       Pharmacotherapy: No side-effects or adverse reactions reported. Compliance: No problems identified. Effectiveness: Clinically acceptable.  WRise Patience RN  09/04/2021 12:58 PM  Sign when Signing Visit Nursing Pain Medication Assessment:  Safety precautions to be maintained throughout the outpatient stay will include: orient to surroundings, keep bed in low position, maintain call bell within reach at all times, provide assistance with transfer out of bed and ambulation.  Medication Inspection Compliance: Pill count conducted under aseptic conditions, in front of the patient. Neither the pills nor the bottle was removed from  the patient's sight at any time. Once count was completed  pills were immediately returned to the patient in their original bottle.  Medication #1: Tramadol (Ultram) Pill/Patch Count:  36 of 90 pills remain Pill/Patch Appearance: Markings consistent with prescribed medication Bottle Appearance: Standard pharmacy container. Clearly labeled. Filled Date: 07 / 26 / 2023 Last Medication intake:  Today  Medication #2: Buprenorphine (Suboxone) Pill/Patch Count:  1 of 4 patches remain Pill/Patch Appearance: Markings consistent with prescribed medication Bottle Appearance: Standard pharmacy container. Clearly labeled. Filled Date: 7 / 26 / 2023 Last Medication intake:  Today  No results found for: "CBDTHCR" No results found for: "D8THCCBX" No results found for: "D9THCCBX"  UDS:  No results found for: "SUMMARY"    ROS  Constitutional: Denies any fever or chills Gastrointestinal: No reported hemesis, hematochezia, vomiting, or acute GI distress Musculoskeletal:  Low back pain, radiation to bilateral legs Neurological: No reported episodes of acute onset apraxia, aphasia, dysarthria, agnosia, amnesia, paralysis, loss of coordination, or loss of consciousness  Medication Review  Fluticasone-Umeclidin-Vilant, Glucosamine Sulfate, Multi-Vitamins, Nebulizers, Potassium, acetaminophen, albuterol, buprenorphine, busPIRone, calcium carbonate, citalopram, clobetasol cream, cyanocobalamin, levothyroxine, tolterodine, and traMADol  History Review  Allergy: Julie Mckinney is allergic to tape. Drug: Julie Mckinney  reports no history of drug use. Alcohol:  reports current alcohol use of about 1.0 standard drink of alcohol per week. Tobacco:  reports that she quit smoking about 26 years ago. Her smoking use included cigarettes. She has a 15.00 pack-year smoking history. She has never used smokeless tobacco. Social: Julie Mckinney  reports that she quit smoking about 26 years ago. Her smoking use included cigarettes. She has a 15.00 pack-year smoking history. She has  never used smokeless tobacco. She reports current alcohol use of about 1.0 standard drink of alcohol per week. She reports that she does not use drugs. Medical:  has a past medical history of Anxiety, Asthma, Breast cancer ([place]) (Right), COPD (chronic obstructive pulmonary disease) ([place]), Depression, and Personal history of radiation therapy. Surgical: Julie Mckinney  has a past surgical history that includes Appendectomy; Colonoscopy; Cholecystectomy; Abdominal hysterectomy; Esophagogastroduodenoscopy (egd) with propofol (N/A, 08/14/2016); Colonoscopy with propofol (N/A, 08/14/2016); Back surgery; Breast biopsy (Right, 2015); Breast excisional biopsy (Right, 1975); and Breast lumpectomy (Right, 2015). Family: family history includes Cancer in her father.  Laboratory Chemistry Profile   Renal Lab Results  Component Value Date   BUN 27 (H) 01/19/2019   CREATININE 0.56 01/19/2019   GFRAA >60 01/19/2019   GFRNONAA >60 01/19/2019    Hepatic Lab Results  Component Value Date   AST 20 01/17/2019   ALT 18 01/17/2019   ALBUMIN 3.5 01/17/2019   ALKPHOS 34 (L) 01/17/2019    Electrolytes Lab Results  Component Value Date   NA 138 03/07/2021   K 4.0 01/19/2019   CL 111 01/19/2019   CALCIUM 8.5 (L) 01/19/2019    Bone No results found for: "VD25OH", "VD125OH2TOT", "YI5027XA1", "OI7867EH2", "25OHVITD1", "25OHVITD2", "25OHVITD3", "TESTOFREE", "TESTOSTERONE"  Inflammation (CRP: Acute Phase) (ESR: Chronic Phase) No results found for: "CRP", "ESRSEDRATE", "LATICACIDVEN"       Note: Above Lab results reviewed.  Recent Imaging Review  CT MAXILLOFACIAL WO CONTRAST CLINICAL DATA:  Polytrauma, blunt; Facial trauma, blunt fell, hit bathtub  EXAM: CT HEAD WITHOUT CONTRAST  CT MAXILLOFACIAL WITHOUT CONTRAST  CT CERVICAL SPINE WITHOUT CONTRAST  TECHNIQUE: Multidetector CT imaging of the head, cervical spine, and maxillofacial structures were performed using the standard protocol without  intravenous contrast. Multiplanar CT image reconstructions of the  cervical spine and maxillofacial structures were also generated.  RADIATION DOSE REDUCTION: This exam was performed according to the departmental dose-optimization program which includes automated exposure control, adjustment of the mA and/or kV according to patient size and/or use of iterative reconstruction technique.  COMPARISON:  CT head July 14, 2019. CT cervical spine 01/17/2019  FINDINGS: CT HEAD FINDINGS  Brain: No evidence of acute infarction, hemorrhage, hydrocephalus, extra-axial collection or mass lesion/mass effect. 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 Cerebral atrophy.  Vascular: No hyperdense vessel.  Skull: No acute fracture.  Other: No mastoid effusions.  CT MAXILLOFACIAL FINDINGS  Osseous: Mild deformity of the left psych Oma, which appears new since 2020 and potentially acute. TMJ degenerative change  Orbits: Negative. No traumatic or inflammatory finding.  Sinuses: Left maxillary sinus air-fluid level. Otherwise, largely clear.  Soft tissues: Negative.  CT CERVICAL SPINE FINDINGS  Alignment: Similar anterolisthesis of C3 on C4 and C4 on C5. No new sagittal subluxation.  Skull base and vertebrae: Vertebral body heights are maintained. No evidence of acute fracture.  Soft tissues and spinal canal: No prevertebral fluid or swelling. No visible canal hematoma.  Disc levels: Similar multilevel degenerative disc disease, greatest in lower cervical spine. Similar multilevel facet and uncovertebral hypertrophy with varying degrees of neural foraminal stenosis.  Upper chest: Visualized lung apices are clear.  IMPRESSION: CT head:  1. No evidence of acute intracranial abnormality. 2. 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  CT maxillofacial:  Mild deformity of the left psych Oma, which appears new since 2020 and potentially  acute. Correlate with the presence or absence of point tenderness.  CT cervical spine:  No evidence of acute fracture or traumatic malalignment.  Electronically Signed   By: Margaretha Sheffield M.D.   On: 08/22/2021 15:03 CT Cervical Spine Wo Contrast CLINICAL DATA:  Polytrauma, blunt; Facial trauma, blunt fell, hit bathtub  EXAM: CT HEAD WITHOUT CONTRAST  CT MAXILLOFACIAL WITHOUT CONTRAST  CT CERVICAL SPINE WITHOUT CONTRAST  TECHNIQUE: Multidetector CT imaging of the head, cervical spine, and maxillofacial structures were performed using the standard protocol without intravenous contrast. Multiplanar CT image reconstructions of the cervical spine and maxillofacial structures were also generated.  RADIATION DOSE REDUCTION: This exam was performed according to the departmental dose-optimization program which includes automated exposure control, adjustment of the mA and/or kV according to patient size and/or use of iterative reconstruction technique.  COMPARISON:  CT head July 14, 2019. CT cervical spine 01/17/2019  FINDINGS: CT HEAD FINDINGS  Brain: No evidence of acute infarction, hemorrhage, hydrocephalus, extra-axial collection or mass lesion/mass effect. 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 Cerebral atrophy.  Vascular: No hyperdense vessel.  Skull: No acute fracture.  Other: No mastoid effusions.  CT MAXILLOFACIAL FINDINGS  Osseous: Mild deformity of the left psych Oma, which appears new since 2020 and potentially acute. TMJ degenerative change  Orbits: Negative. No traumatic or inflammatory finding.  Sinuses: Left maxillary sinus air-fluid level. Otherwise, largely clear.  Soft tissues: Negative.  CT CERVICAL SPINE FINDINGS  Alignment: Similar anterolisthesis of C3 on C4 and C4 on C5. No new sagittal subluxation.  Skull base and vertebrae: Vertebral body heights are maintained. No evidence of acute fracture.  Soft  tissues and spinal canal: No prevertebral fluid or swelling. No visible canal hematoma.  Disc levels: Similar multilevel degenerative disc disease, greatest in lower cervical spine. Similar multilevel facet and uncovertebral hypertrophy with varying degrees of neural foraminal stenosis.  Upper chest: Visualized lung apices are clear.  IMPRESSION: CT head:  1. No evidence of acute intracranial abnormality. 2. 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  CT maxillofacial:  Mild deformity of the left psych Oma, which appears new since 2020 and potentially acute. Correlate with the presence or absence of point tenderness.  CT cervical spine:  No evidence of acute fracture or traumatic malalignment.  Electronically Signed   By: Margaretha Sheffield M.D.   On: 08/22/2021 15:03 CT Head Wo Contrast CLINICAL DATA:  Polytrauma, blunt; Facial trauma, blunt fell, hit bathtub  EXAM: CT HEAD WITHOUT CONTRAST  CT MAXILLOFACIAL WITHOUT CONTRAST  CT CERVICAL SPINE WITHOUT CONTRAST  TECHNIQUE: Multidetector CT imaging of the head, cervical spine, and maxillofacial structures were performed using the standard protocol without intravenous contrast. Multiplanar CT image reconstructions of the cervical spine and maxillofacial structures were also generated.  RADIATION DOSE REDUCTION: This exam was performed according to the departmental dose-optimization program which includes automated exposure control, adjustment of the mA and/or kV according to patient size and/or use of iterative reconstruction technique.  COMPARISON:  CT head July 14, 2019. CT cervical spine 01/17/2019  FINDINGS: CT HEAD FINDINGS  Brain: No evidence of acute infarction, hemorrhage, hydrocephalus, extra-axial collection or mass lesion/mass effect. 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 Cerebral atrophy.  Vascular: No hyperdense vessel.  Skull: No acute  fracture.  Other: No mastoid effusions.  CT MAXILLOFACIAL FINDINGS  Osseous: Mild deformity of the left psych Oma, which appears new since 2020 and potentially acute. TMJ degenerative change  Orbits: Negative. No traumatic or inflammatory finding.  Sinuses: Left maxillary sinus air-fluid level. Otherwise, largely clear.  Soft tissues: Negative.  CT CERVICAL SPINE FINDINGS  Alignment: Similar anterolisthesis of C3 on C4 and C4 on C5. No new sagittal subluxation.  Skull base and vertebrae: Vertebral body heights are maintained. No evidence of acute fracture.  Soft tissues and spinal canal: No prevertebral fluid or swelling. No visible canal hematoma.  Disc levels: Similar multilevel degenerative disc disease, greatest in lower cervical spine. Similar multilevel facet and uncovertebral hypertrophy with varying degrees of neural foraminal stenosis.  Upper chest: Visualized lung apices are clear.  IMPRESSION: CT head:  1. No evidence of acute intracranial abnormality. 2. 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  CT maxillofacial:  Mild deformity of the left psych Oma, which appears new since 2020 and potentially acute. Correlate with the presence or absence of point tenderness.  CT cervical spine:  No evidence of acute fracture or traumatic malalignment.  Electronically Signed   By: Margaretha Sheffield M.D.   On: 08/22/2021 15:03 Note: Reviewed        Physical Exam  General appearance: Well nourished, well developed, and well hydrated. In no apparent acute distress facial bruising of left side after fall Mental status: Alert, oriented x 3 (person, place, & time)       Respiratory: No evidence of acute respiratory distress Eyes: PERLA Vitals: BP (!) 105/92   Pulse 91   Temp 98.8 F (37.1 C) (Temporal)   Resp 16   Ht 5' (1.524 m)   Wt 98 lb (44.5 kg)   SpO2 98%   BMI 19.14 kg/m  BMI: Estimated body mass index is 19.14 kg/m as  calculated from the following:   Height as of this encounter: 5' (1.524 m).   Weight as of this encounter: 98 lb (44.5 kg). Ideal: Female patients must weigh  at least 45.5 kg to calculate ideal body weight  Lumbar radicular pain Tenderness along left piriformis muscle  Assessment   Diagnosis Status  1. Chronic radicular lumbar pain (LEFT)   2. Lumbar post-laminectomy syndrome   3. Chronic pain syndrome   4. Piriformis syndrome of left side    Having a Flare-up Persistent Controlled   Updated Problems: No problems updated.  Plan of Care  Problem-specific:  No problem-specific Assessment & Plan notes found for this encounter.  Julie Mckinney has a current medication list which includes the following long-term medication(s): albuterol, calcium carbonate, citalopram, levothyroxine, and potassium.  Pharmacotherapy (Medications Ordered): Meds ordered this encounter  Medications   buprenorphine (BUTRANS) 20 MCG/HR PTWK    Sig: Place 1 patch onto the skin once a week.    Dispense:  4 patch    Refill:  2    Chronic Pain: STOP Act (Not applicable) Fill 1 day early if closed on refill date. Avoid benzodiazepines within 8 hours of opioids   traMADol (ULTRAM) 50 MG tablet    Sig: Take 1 tablet (50 mg total) by mouth every 8 (eight) hours as needed for severe pain.    Dispense:  90 tablet    Refill:  2    Fill one day early if pharmacy is closed on scheduled refill date.   Orders:  Orders Placed This Encounter  Procedures   Caudal Epidural Injection    Standing Status:   Future    Standing Expiration Date:   12/05/2021    Scheduling Instructions:     Caudal ESI    Order Specific Question:   Where will this procedure be performed?    Answer:   [facility]   TRIGGER POINT INJECTION    Area: Buttocks region (gluteal area) Indications: Piriformis muscle pain; Left (G57.02) piriformis-syndrome; piriformis muscle spasms (I77.824). CPT code: 20552    Standing Status:    Future    Standing Expiration Date:   01/04/2022    Scheduling Instructions:     Type: Myoneural block (TPI) of piriformis muscle.     Side:  LEFT     Sedation: Patient's choice.     Timeframe: Today    Order Specific Question:   Where will this procedure be performed?    Answer:   [facility]   Follow-up plan:   Return in about 8 days (around 09/12/2021) for Caudal + Left Piriformis TPI, in clinic [facility].    Recent Visits Date Type Provider Dept  08/08/21 Procedure visit Gillis Santa, MD [facility]  07/11/21 Office Visit Gillis Santa, MD [facility]  06/25/21 Procedure visit Gillis Santa, MD [facility]  06/12/21 Office Visit Gillis Santa, MD [facility]  Showing recent visits within past 90 days and meeting all other requirements Today's Visits Date Type Provider Dept  09/04/21 Office Visit Gillis Santa, MD [facility]  Showing today's visits and meeting all other requirements Future Appointments Date Type Provider Dept  09/12/21 Appointment Gillis Santa, MD [facility]  Showing future appointments within next 90 days and meeting all other requirements  I discussed the assessment and treatment plan with the patient. The patient was provided an opportunity to ask questions and all were answered. The patient agreed with the plan and demonstrated an understanding of the instructions.  Patient advised to call back or seek an in-person evaluation if the symptoms or condition worsens.  Duration of encounter: 59mnutes.  Total time on encounter,  as per AMA guidelines included both the face-to-face and non-face-to-face time personally spent by the physician and/or other qualified health care professional(s) on the day of the encounter (includes time in activities that require the physician or other qualified health care professional and does not include time in activities normally performed by clinical staff).  Physician's time may include the following activities when performed: preparing to see the patient (eg, review of tests, pre-charting review of records) obtaining and/or reviewing separately obtained history performing a medically appropriate examination and/or evaluation counseling and educating the patient/family/caregiver ordering medications, tests, or procedures referring and communicating with other health care professionals (when not separately reported) documenting clinical information in the electronic or other health record independently interpreting results (not separately reported) and communicating results to the patient/ family/caregiver care coordination (not separately reported)  Note by: Gillis Santa, MD Date: 09/04/2021; Time: 1:33 PM

## 2021-09-04 NOTE — Progress Notes (Signed)
Nursing Pain Medication Assessment:  Safety precautions to be maintained throughout the outpatient stay will include: orient to surroundings, keep bed in low position, maintain call bell within reach at all times, provide assistance with transfer out of bed and ambulation.  Medication Inspection Compliance: Pill count conducted under aseptic conditions, in front of the patient. Neither the pills nor the bottle was removed from the patient's sight at any time. Once count was completed pills were immediately returned to the patient in their original bottle.  Medication #1: Tramadol (Ultram) Pill/Patch Count:  36 of 90 pills remain Pill/Patch Appearance: Markings consistent with prescribed medication Bottle Appearance: Standard pharmacy container. Clearly labeled. Filled Date: 07 / 26 / 2023 Last Medication intake:  Today  Medication #2: Buprenorphine (Suboxone) Pill/Patch Count:  1 of 4 patches remain Pill/Patch Appearance: Markings consistent with prescribed medication Bottle Appearance: Standard pharmacy container. Clearly labeled. Filled Date: 7 / 26 / 2023 Last Medication intake:  Today

## 2021-09-04 NOTE — Patient Instructions (Signed)
GENERAL RISKS AND COMPLICATIONS ° °What are the risk, side effects and possible complications? °Generally speaking, most procedures are safe.  However, with any procedure there are risks, side effects, and the possibility of complications.  The risks and complications are dependent upon the sites that are lesioned, or the type of nerve block to be performed.  The closer the procedure is to the spine, the more serious the risks are.  Great care is taken when placing the radio frequency needles, block needles or lesioning probes, but sometimes complications can occur. °Infection: Any time there is an injection through the skin, there is a risk of infection.  This is why sterile conditions are used for these blocks.  There are four possible types of infection. °Localized skin infection. °Central Nervous System Infection-This can be in the form of Meningitis, which can be deadly. °Epidural Infections-This can be in the form of an epidural abscess, which can cause pressure inside of the spine, causing compression of the spinal cord with subsequent paralysis. This would require an emergency surgery to decompress, and there are no guarantees that the patient would recover from the paralysis. °Discitis-This is an infection of the intervertebral discs.  It occurs in about 1% of discography procedures.  It is difficult to treat and it may lead to surgery. ° °      2. Pain: the needles have to go through skin and soft tissues, will cause soreness. °      3. Damage to internal structures:  The nerves to be lesioned may be near blood vessels or   ° other nerves which can be potentially damaged. °      4. Bleeding: Bleeding is more common if the patient is taking blood thinners such as  aspirin, Coumadin, Ticiid, Plavix, etc., or if he/she have some genetic predisposition  such as hemophilia. Bleeding into the spinal canal can cause compression of the spinal  cord with subsequent paralysis.  This would require an emergency  surgery to  decompress and there are no guarantees that the patient would recover from the  paralysis. °      5. Pneumothorax:  Puncturing of a lung is a possibility, every time a needle is introduced in  the area of the chest or upper back.  Pneumothorax refers to free air around the  collapsed lung(s), inside of the thoracic cavity (chest cavity).  Another two possible  complications related to a similar event would include: Hemothorax and Chylothorax.   These are variations of the Pneumothorax, where instead of air around the collapsed  lung(s), you may have blood or chyle, respectively. °      6. Spinal headaches: They may occur with any procedures in the area of the spine. °      7. Persistent CSF (Cerebro-Spinal Fluid) leakage: This is a rare problem, but may occur  with prolonged intrathecal or epidural catheters either due to the formation of a fistulous  track or a dural tear. °      8. Nerve damage: By working so close to the spinal cord, there is always a possibility of  nerve damage, which could be as serious as a permanent spinal cord injury with  paralysis. °      9. Death:  Although rare, severe deadly allergic reactions known as "Anaphylactic  reaction" can occur to any of the medications used. °     10. Worsening of the symptoms:  We can always make thing worse. ° °What are the chances   of something like this happening? °Chances of any of this occuring are extremely low.  By statistics, you have more of a chance of getting killed in a motor vehicle accident: while driving to the hospital than any of the above occurring .  Nevertheless, you should be aware that they are possibilities.  In general, it is similar to taking a shower.  Everybody knows that you can slip, hit your head and get killed.  Does that mean that you should not shower again?  Nevertheless always keep in mind that statistics do not mean anything if you happen to be on the wrong side of them.  Even if a procedure has a 1 (one) in a  1,000,000 (million) chance of going wrong, it you happen to be that one..Also, keep in mind that by statistics, you have more of a chance of having something go wrong when taking medications. ° °Who should not have this procedure? °If you are on a blood thinning medication (e.g. Coumadin, Plavix, see list of "Blood Thinners"), or if you have an active infection going on, you should not have the procedure.  If you are taking any blood thinners, please inform your physician. ° °How should I prepare for this procedure? °Do not eat or drink anything at least six hours prior to the procedure. °Bring a driver with you .  It cannot be a taxi. °Come accompanied by an adult that can drive you back, and that is strong enough to help you if your legs get weak or numb from the local anesthetic. °Take all of your medicines the morning of the procedure with just enough water to swallow them. °If you have diabetes, make sure that you are scheduled to have your procedure done first thing in the morning, whenever possible. °If you have diabetes, take only half of your insulin dose and notify our nurse that you have done so as soon as you arrive at the clinic. °If you are diabetic, but only take blood sugar pills (oral hypoglycemic), then do not take them on the morning of your procedure.  You may take them after you have had the procedure. °Do not take aspirin or any aspirin-containing medications, at least eleven (11) days prior to the procedure.  They may prolong bleeding. °Wear loose fitting clothing that may be easy to take off and that you would not mind if it got stained with Betadine or blood. °Do not wear any jewelry or perfume °Remove any nail coloring.  It will interfere with some of our monitoring equipment. ° °NOTE: Remember that this is not meant to be interpreted as a complete list of all possible complications.  Unforeseen problems may occur. ° °BLOOD THINNERS °The following drugs contain aspirin or other products,  which can cause increased bleeding during surgery and should not be taken for 2 weeks prior to and 1 week after surgery.  If you should need take something for relief of minor pain, you may take acetaminophen which is found in Tylenol,m Datril, Anacin-3 and Panadol. It is not blood thinner. The products listed below are.  Do not take any of the products listed below in addition to any listed on your instruction sheet. ° °A.P.C or A.P.C with Codeine Codeine Phosphate Capsules #3 Ibuprofen Ridaura  °ABC compound Congesprin Imuran rimadil  °Advil Cope Indocin Robaxisal  °Alka-Seltzer Effervescent Pain Reliever and Antacid Coricidin or Coricidin-D ° Indomethacin Rufen  °Alka-Seltzer plus Cold Medicine Cosprin Ketoprofen S-A-C Tablets  °Anacin Analgesic Tablets or Capsules Coumadin   Korlgesic Salflex  Anacin Extra Strength Analgesic tablets or capsules CP-2 Tablets Lanoril Salicylate  Anaprox Cuprimine Capsules Levenox Salocol  Anexsia-D Dalteparin Magan Salsalate  Anodynos Darvon compound Magnesium Salicylate Sine-off  Ansaid Dasin Capsules Magsal Sodium Salicylate  Anturane Depen Capsules Marnal Soma  APF Arthritis pain formula Dewitt's Pills Measurin Stanback  Argesic Dia-Gesic Meclofenamic Sulfinpyrazone  Arthritis Bayer Timed Release Aspirin Diclofenac Meclomen Sulindac  Arthritis pain formula Anacin Dicumarol Medipren Supac  Analgesic (Safety coated) Arthralgen Diffunasal Mefanamic Suprofen  Arthritis Strength Bufferin Dihydrocodeine Mepro Compound Suprol  Arthropan liquid Dopirydamole Methcarbomol with Aspirin Synalgos  ASA tablets/Enseals Disalcid Micrainin Tagament  Ascriptin Doan's Midol Talwin  Ascriptin A/D Dolene Mobidin Tanderil  Ascriptin Extra Strength Dolobid Moblgesic Ticlid  Ascriptin with Codeine Doloprin or Doloprin with Codeine Momentum Tolectin  Asperbuf Duoprin Mono-gesic Trendar  Aspergum Duradyne Motrin or Motrin IB Triminicin  Aspirin plain, buffered or enteric coated  Durasal Myochrisine Trigesic  Aspirin Suppositories Easprin Nalfon Trillsate  Aspirin with Codeine Ecotrin Regular or Extra Strength Naprosyn Uracel  Atromid-S Efficin Naproxen Ursinus  Auranofin Capsules Elmiron Neocylate Vanquish  Axotal Emagrin Norgesic Verin  Azathioprine Empirin or Empirin with Codeine Normiflo Vitamin E  Azolid Emprazil Nuprin Voltaren  Bayer Aspirin plain, buffered or children's or timed BC Tablets or powders Encaprin Orgaran Warfarin Sodium  Buff-a-Comp Enoxaparin Orudis Zorpin  Buff-a-Comp with Codeine Equegesic Os-Cal-Gesic   Buffaprin Excedrin plain, buffered or Extra Strength Oxalid   Bufferin Arthritis Strength Feldene Oxphenbutazone   Bufferin plain or Extra Strength Feldene Capsules Oxycodone with Aspirin   Bufferin with Codeine Fenoprofen Fenoprofen Pabalate or Pabalate-SF   Buffets II Flogesic Panagesic   Buffinol plain or Extra Strength Florinal or Florinal with Codeine Panwarfarin   Buf-Tabs Flurbiprofen Penicillamine   Butalbital Compound Four-way cold tablets Penicillin   Butazolidin Fragmin Pepto-Bismol   Carbenicillin Geminisyn Percodan   Carna Arthritis Reliever Geopen Persantine   Carprofen Gold's salt Persistin   Chloramphenicol Goody's Phenylbutazone   Chloromycetin Haltrain Piroxlcam   Clmetidine heparin Plaquenil   Cllnoril Hyco-pap Ponstel   Clofibrate Hydroxy chloroquine Propoxyphen         Before stopping any of these medications, be sure to consult the physician who ordered them.  Some, such as Coumadin (Warfarin) are ordered to prevent or treat serious conditions such as "deep thrombosis", "pumonary embolisms", and other heart problems.  The amount of time that you may need off of the medication may also vary with the medication and the reason for which you were taking it.  If you are taking any of these medications, please make sure you notify your pain physician before you undergo any procedures.         Trigger Point  Injections Patient Information  Description: Trigger points are areas of muscle sensitive to touch which cause pain with movement, sometimes felt some distance from the site of palpation.  Usually the muscle containing these trigger points if felt as a tight band or knot.   The area of maximum tenderness or trigger point is identified, and after antiseptic preparation of the skin, a small needle is placed into this site.  Reproduction of the pain often occurs and numbing medicine (local anesthetic) is injected into the site, sometimes along with steroid preparation.  The entire block usually lasts less than 5 minutes.  Conditions which may be treated by trigger points:  Muscular pain and spasm Nerve irritation  Preparation for the injection:  Do not eat any solid food or dairy products within 8 hours  of your appointment. You may drink clear liquids up to 3 hours before appointment.  Clear liquids include water, black coffee, juice or soda.  No milk or cream please. You may take your regular medications, including pain medications, with a sip of water before your appointment.  Diabetics should hold regular insulin ( if take separately) and take 1/2 normal NPH dose the morning of the procedure.  Carry some sugar containing items with you to your appointment. A driver must accompany you and be prepared to drive you home after your procedure.  Bring all your current medications with you. An IV may be inserted and sedation may be given at the discretion of the physician.  A blood pressure cuff, EKG, and other monitors will often be applied during the procedure.  Some patients may need to have extra oxygen administered for a short period. You will be asked to provide medical information, including your allergies and medications, prior to the procedure.  We must know immediately if you are taking blood thinners (like Coumadin/Warfarin) or if you are allergic to IV iodine contrast (dye).  We must know if  you could possibly be pregnant.  Possible side-effects:  Bleeding from needle site Infection (rare, may require surgery) Nerve injury (rare) Numbness & tingling (temporary) Punctured lung (if injection around chest) Light-headedness (temporary) Pain at injection site (several days) Decreased blood pressure (rare, temporary) Weakness in arm/leg (temporary)  Call if you experience:  Hive or difficulty breathing (go to the emergency room) Inflammation or drainage at the injection site(s)  Please note:  Although the local anesthetic injected can often make your painful muscle feel good for several hours after the injection, the pain may return.  It takes 3-7 days for steroids to work.  You may not notice any pain relief for at least one week.  If effective, we will often do a series of injections spaced 3-6 weeks apart to maximally decrease your pain.  If you have any questions please call [phone] [facility] Patient Information  Description: The epidural space surrounds the nerves as they exit the spinal cord.  In some patients, the nerves can be compressed and inflamed by a bulging disc or a tight spinal canal (spinal stenosis).  By injecting steroids into the epidural space, we can bring irritated nerves into direct contact with a potentially helpful medication.  These steroids act directly on the irritated nerves and can reduce swelling and inflammation which often leads to decreased pain.  Epidural steroids may be injected anywhere along the spine and from the neck to the low back depending upon the location of your pain.   After numbing the skin with local anesthetic (like Novocaine), a small needle is passed into the epidural space slowly.  You may experience a sensation of pressure while this is being done.  The entire block usually last less than 10 minutes.  Conditions which may be treated by epidural  steroids:  Low back and leg pain Neck and arm pain Spinal stenosis Post-laminectomy syndrome Herpes zoster (shingles) pain Pain from compression fractures  Preparation for the injection:  Do not eat any solid food or dairy products within 8 hours of your appointment.  You may drink clear liquids up to 3 hours before appointment.  Clear liquids include water, black coffee, juice or soda.  No milk or cream please. You may take your regular medication, including pain medications, with a sip of water before your appointment  Diabetics  should hold regular insulin (if taken separately) and take 1/2 normal NPH dos the morning of the procedure.  Carry some sugar containing items with you to your appointment. A driver must accompany you and be prepared to drive you home after your procedure.  Bring all your current medications with your. An IV may be inserted and sedation may be given at the discretion of the physician.   A blood pressure cuff, EKG and other monitors will often be applied during the procedure.  Some patients may need to have extra oxygen administered for a short period. You will be asked to provide medical information, including your allergies, prior to the procedure.  We must know immediately if you are taking blood thinners (like Coumadin/Warfarin)  Or if you are allergic to IV iodine contrast (dye). We must know if you could possible be pregnant.  Possible side-effects: Bleeding from needle site Infection (rare, may require surgery) Nerve injury (rare) Numbness & tingling (temporary) Difficulty urinating (rare, temporary) Spinal headache ( a headache worse with upright posture) Light -headedness (temporary) Pain at injection site (several days) Decreased blood pressure (temporary) Weakness in arm/leg (temporary) Pressure sensation in back/neck (temporary)  Call if you experience: Fever/chills associated with headache or increased back/neck pain. Headache worsened by an  upright position. New onset weakness or numbness of an extremity below the injection site Hives or difficulty breathing (go to the emergency room) Inflammation or drainage at the infection site Severe back/neck pain Any new symptoms which are concerning to you  Please note:  Although the local anesthetic injected can often make your back or neck feel good for several hours after the injection, the pain will likely return.  It takes 3-7 days for steroids to work in the epidural space.  You may not notice any pain relief for at least that one week.  If effective, we will often do a series of three injections spaced 3-6 weeks apart to maximally decrease your pain.  After the initial series, we generally will wait several months before considering a repeat injection of the same type.  If you have any questions, please call [phone] [facility]

## 2021-09-05 ENCOUNTER — Ambulatory Visit (HOSPITAL_BASED_OUTPATIENT_CLINIC_OR_DEPARTMENT_OTHER): Payer: Medicare Other | Admitting: Student in an Organized Health Care Education/Training Program

## 2021-09-05 DIAGNOSIS — M792 Neuralgia and neuritis, unspecified: Secondary | ICD-10-CM

## 2021-09-06 NOTE — Progress Notes (Signed)
No appt 

## 2021-09-12 ENCOUNTER — Ambulatory Visit
Admission: RE | Admit: 2021-09-12 | Discharge: 2021-09-12 | Disposition: A | Discharge status: Home / Self Care | Payer: Medicare Other | Source: Ambulatory Visit | Attending: Student in an Organized Health Care Education/Training Program | Admitting: Student in an Organized Health Care Education/Training Program

## 2021-09-12 ENCOUNTER — Ambulatory Visit
Payer: Medicare Other | Attending: Student in an Organized Health Care Education/Training Program | Admitting: Student in an Organized Health Care Education/Training Program

## 2021-09-12 ENCOUNTER — Encounter: Payer: Self-pay | Admitting: Student in an Organized Health Care Education/Training Program

## 2021-09-12 ENCOUNTER — Other Ambulatory Visit: Payer: Self-pay

## 2021-09-12 VITALS — BP 133/75 | HR 76 | Temp 97.3°F | Resp 21 | Ht 60.0 in | Wt 98.0 lb

## 2021-09-12 DIAGNOSIS — G894 Chronic pain syndrome: Secondary | ICD-10-CM | POA: Insufficient documentation

## 2021-09-12 DIAGNOSIS — G8929 Other chronic pain: Secondary | ICD-10-CM | POA: Insufficient documentation

## 2021-09-12 DIAGNOSIS — M5416 Radiculopathy, lumbar region: Secondary | ICD-10-CM | POA: Insufficient documentation

## 2021-09-12 DIAGNOSIS — G5702 Lesion of sciatic nerve, left lower limb: Secondary | ICD-10-CM | POA: Diagnosis present

## 2021-09-12 MED ORDER — ROPIVACAINE HCL 2 MG/ML IJ SOLN
2.0000 mL | Freq: Once | INTRAMUSCULAR | Status: AC
  Administered 2021-09-12: 2 mL via EPIDURAL

## 2021-09-12 MED ORDER — LIDOCAINE HCL 2 % IJ SOLN
INTRAMUSCULAR | Status: AC
  Filled 2021-09-12: qty 20

## 2021-09-12 MED ORDER — DEXAMETHASONE SODIUM PHOSPHATE 10 MG/ML IJ SOLN
10.0000 mg | Freq: Once | INTRAMUSCULAR | Status: AC
  Administered 2021-09-12: 10 mg

## 2021-09-12 MED ORDER — SODIUM CHLORIDE 0.9% FLUSH
2.0000 mL | Freq: Once | INTRAVENOUS | Status: AC
  Administered 2021-09-12: 2 mL

## 2021-09-12 MED ORDER — ROPIVACAINE HCL 2 MG/ML IJ SOLN
INTRAMUSCULAR | Status: AC
  Filled 2021-09-12: qty 20

## 2021-09-12 MED ORDER — DEXAMETHASONE SODIUM PHOSPHATE 10 MG/ML IJ SOLN
INTRAMUSCULAR | Status: AC
  Filled 2021-09-12: qty 2

## 2021-09-12 MED ORDER — IOHEXOL 180 MG/ML  SOLN
10.0000 mL | Freq: Once | INTRAMUSCULAR | Status: AC
  Administered 2021-09-12: 10 mL via EPIDURAL

## 2021-09-12 MED ORDER — LIDOCAINE HCL 2 % IJ SOLN
20.0000 mL | Freq: Once | INTRAMUSCULAR | Status: AC
  Administered 2021-09-12: 400 mg

## 2021-09-12 MED ORDER — IOHEXOL 180 MG/ML  SOLN
INTRAMUSCULAR | Status: AC
  Filled 2021-09-12: qty 20

## 2021-09-12 MED ORDER — SODIUM CHLORIDE (PF) 0.9 % IJ SOLN
INTRAMUSCULAR | Status: AC
  Filled 2021-09-12: qty 10

## 2021-09-12 NOTE — Progress Notes (Deleted)
Safety precautions to be maintained throughout the outpatient stay will include: orient to surroundings, keep bed in low position, maintain call bell within reach at all times, provide assistance with transfer out of bed and ambulation.  

## 2021-09-12 NOTE — Progress Notes (Signed)
Nursing Pain Medication Assessment:  Safety precautions to be maintained throughout the outpatient stay will include: orient to surroundings, keep bed in low position, maintain call bell within reach at all times, provide assistance with transfer out of bed and ambulation.  Medication Inspection Compliance: Pill count conducted under aseptic conditions, in front of the patient. Neither the pills nor the bottle was removed from the patient's sight at any time. Once count was completed pills were immediately returned to the patient in their original bottle.  Medication #1: Tramadol (Ultram) Pill/Patch Count:  14 of 90 pills remain Pill/Patch Appearance: Markings consistent with prescribed medication Bottle Appearance: Standard pharmacy container. Clearly labeled. Filled Date: 07 / 26 / 2023 Last Medication intake:  Today  Medication #2: Buprenorphine (Suboxone) Pill/Patch Count:  1 of 4 patches remain Pill/Patch Appearance: Markings consistent with prescribed medication Bottle Appearance: Standard pharmacy container. Clearly labeled. Filled Date: 07 / 26 / 2023 Last Medication intake:   last application 35/68/61

## 2021-09-12 NOTE — Patient Instructions (Signed)

## 2021-09-12 NOTE — Progress Notes (Signed)
PROVIDER NOTE: Interpretation of information contained herein should be left to medically-trained personnel. Specific patient instructions are provided elsewhere under "Patient Instructions" section of medical record. This document was created in part using STT-dictation technology, any transcriptional errors that may result from this process are unintentional.  Patient: Julie Mckinney Type: Established DOB: February 20, 1934 MRN: [id number] PCP: Gladstone Lighter, MD  Service: Procedure DOS: 09/12/2021 Setting: Ambulatory Location: Ambulatory outpatient facility Delivery: Face-to-face Provider: Gillis Santa, MD Specialty: Interventional Pain Management Specialty designation: 09 Location: Outpatient facility Ref. Prov.: Gladstone Lighter, MD    Interventional Therapy      Procedure: Caudal Epidural Steroid Injection  & Left Piriformis TPI   Laterality: Midline aiming left Level: Sacrococcygeal ligament  Imaging: Fluoroscopy-guided         Anesthesia: Local anesthesia (1-2% Lidocaine)  DOS: 09/12/2021  Performed by: Gillis Santa, MD  Purpose: Diagnostic/Therapeutic Indications: Low back and lower extremity pain severe enough to impact quality of life or function. Rationale (medical necessity): procedure needed and proper for the diagnosis and/or treatment of Julie Mckinney's medical symptoms and needs. 1. Chronic radicular lumbar pain (LEFT)   2. Piriformis syndrome of left side   3. Chronic pain syndrome    NAS-11 Pain score:   Pre-procedure: 9 /10   Post-procedure: 7  (GETTING OFF BED AND WALKING)/10     Target: Lumbosacral epidural canal  Location: Epidural space  Region: Caudal canal Approach: Percutaneous  Type of procedure: Epidural block  Position  Prep  Materials:  Position: Prone  Prep solution: DuraPrep (Iodine Povacrylex [0.7% available iodine] and Isopropyl Alcohol, 74% w/w) Prep Area: Entire posterior lumbosacral area  Materials:  Tray: Epidural Needle(s):   Type: Epidural  Gauge (G): 22  Length: 3.5-in  Qty: 1  Pre-op H&P Assessment:  Julie Mckinney is a 87 y.o. (year old), female patient, seen today for interventional treatment. She  has a past surgical history that includes Appendectomy; Colonoscopy; Cholecystectomy; Abdominal hysterectomy; Esophagogastroduodenoscopy (egd) with propofol (N/A, 08/14/2016); Colonoscopy with propofol (N/A, 08/14/2016); Back surgery; Breast biopsy (Right, 2015); Breast excisional biopsy (Right, 1975); and Breast lumpectomy (Right, 2015). Julie Mckinney has a current medication list which includes the following prescription(s): acetaminophen, albuterol, buprenorphine, buspirone, calcium carbonate, citalopram, clobetasol cream, glucosamine sulfate, levothyroxine, multi-vitamins, nebulizers, potassium, tolterodine, tramadol, cyanocobalamin, and trelegy ellipta. Her primarily concern today is the Back Pain (Lumbar more on the left )  Initial Vital Signs:  Pulse/HCG Rate: 77  Temp:  (!) 97.3 F (36.3 C) Resp: 16 BP: 121/69 SpO2: 100 %  BMI: Estimated body mass index is 19.14 kg/m as calculated from the following:   Height as of this encounter: 5' (1.524 m).   Weight as of this encounter: 98 lb (44.5 kg).  Risk Assessment: Allergies: Reviewed. She is allergic to tape.  Allergy Precautions: None required Coagulopathies: Reviewed. None identified.  Blood-thinner therapy: None at this time Active Infection(s): Reviewed. None identified. Julie Mckinney is afebrile  Site Confirmation: Julie Mckinney was asked to confirm the procedure and laterality before marking the site Procedure checklist: Completed Consent: Before the procedure and under the influence of no sedative(s), amnesic(s), or anxiolytics, the patient was informed of the treatment options, risks and possible complications. To fulfill our ethical and legal obligations, as recommended by the American Medical Association's Code of Ethics, I have informed the patient  of my clinical impression; the nature and purpose of the treatment or procedure; the risks, benefits, and possible complications of the intervention; the alternatives, including doing nothing; the risk(s) and benefit(s) of  the alternative treatment(s) or procedure(s); and the risk(s) and benefit(s) of doing nothing. The patient was provided information about the general risks and possible complications associated with the procedure. These may include, but are not limited to: failure to achieve desired goals, infection, bleeding, organ or nerve damage, allergic reactions, paralysis, and death. In addition, the patient was informed of those risks and complications associated to Spine-related procedures, such as failure to decrease pain; infection (i.e.: Meningitis, epidural or intraspinal abscess); bleeding (i.e.: epidural hematoma, subarachnoid hemorrhage, or any other type of intraspinal or peri-dural bleeding); organ or nerve damage (i.e.: Any type of peripheral nerve, nerve root, or spinal cord injury) with subsequent damage to sensory, motor, and/or autonomic systems, resulting in permanent pain, numbness, and/or weakness of one or several areas of the body; allergic reactions; (i.e.: anaphylactic reaction); and/or death. Furthermore, the patient was informed of those risks and complications associated with the medications. These include, but are not limited to: allergic reactions (i.e.: anaphylactic or anaphylactoid reaction(s)); adrenal axis suppression; blood sugar elevation that in diabetics may result in ketoacidosis or comma; water retention that in patients with history of congestive heart failure may result in shortness of breath, pulmonary edema, and decompensation with resultant heart failure; weight gain; swelling or edema; medication-induced neural toxicity; particulate matter embolism and blood vessel occlusion with resultant organ, and/or nervous system infarction; and/or aseptic necrosis of one  or more joints. Finally, the patient was informed that Medicine is not an exact science; therefore, there is also the possibility of unforeseen or unpredictable risks and/or possible complications that may result in a catastrophic outcome. The patient indicated having understood very clearly. We have given the patient no guarantees and we have made no promises. Enough time was given to the patient to ask questions, all of which were answered to the patient's satisfaction. Julie Mckinney has indicated that she wanted to continue with the procedure. Attestation: I, the ordering provider, attest that I have discussed with the patient the benefits, risks, side-effects, alternatives, likelihood of achieving goals, and potential problems during recovery for the procedure that I have provided informed consent. Date  Time: 09/12/2021  9:52 AM  Imaging Guidance (Spinal):          Type of Imaging Technique: Fluoroscopy Guidance (Spinal) Indication(s): Assistance in needle guidance and placement for procedures requiring needle placement in or near specific anatomical locations not easily accessible without such assistance. Exposure Time: Please see nurses notes. Contrast: Before injecting any contrast, we confirmed that the patient did not have an allergy to iodine, shellfish, or radiological contrast. Once satisfactory needle placement was completed at the desired level, radiological contrast was injected. Contrast injected under live fluoroscopy. No contrast complications. See chart for type and volume of contrast used. Fluoroscopic Guidance: I was personally present during the use of fluoroscopy. "Tunnel Vision Technique" used to obtain the best possible view of the target area. Parallax error corrected before commencing the procedure. "Direction-depth-direction" technique used to introduce the needle under continuous pulsed fluoroscopy. Once target was reached, antero-posterior, oblique, and lateral fluoroscopic  projection used confirm needle placement in all planes. Images permanently stored in EMR. Interpretation: I personally interpreted the imaging intraoperatively. Adequate needle placement confirmed in multiple planes. Appropriate spread of contrast into desired area was observed. No evidence of afferent or efferent intravascular uptake. No intrathecal or subarachnoid spread observed. Permanent images saved into the patient's record.  Pre-Procedure Preparation:  Monitoring: As per clinic protocol. Respiration, ETCO2, SpO2, BP, heart rate and rhythm monitor placed  and checked for adequate function Safety Precautions: Patient was assessed for positional comfort and pressure points before starting the procedure. Time-out: I initiated and conducted the "Time-out" before starting the procedure, as per protocol. The patient was asked to participate by confirming the accuracy of the "Time Out" information. Verification of the correct person, site, and procedure were performed and confirmed by me, the nursing staff, and the patient. "Time-out" conducted as per Joint Commission's Universal Protocol (UP.01.01.01). Time: 1045  Description  Narrative of Procedure:          Procedural Technique Safety Precautions: Aspiration looking for blood return was conducted prior to all injections. At no point did we inject any substances, as a needle was being advanced. No attempts were made at seeking any paresthesias. Safe injection practices and needle disposal techniques used. Medications properly checked for expiration dates. SDV (single dose vial) medications used. Description of the Procedure: Protocol guidelines were followed. The patient was assisted into a comfortable position. The target area was identified and the area prepped in the usual manner. Skin & deeper tissues infiltrated with local anesthetic. Appropriate amount of time allowed to pass for local anesthetics to take effect. The procedure needles were then  advanced to the target area. Proper needle placement secured. Negative aspiration confirmed. Solution injected in intermittent fashion, asking for systemic symptoms every 0.5cc of injectate. The needles were then removed and the area cleansed, making sure to leave some of the prepping solution back to take advantage of its long term bactericidal properties.  Technical description of procedure:  Safety Precautions: Aspiration looking for blood return was conducted prior to all injections. At no point did we inject any substances, as a needle was being advanced. No attempts were made at seeking any paresthesias. Safe injection practices and needle disposal techniques used. Medications properly checked for expiration dates. SDV (single dose vial) medications used. Description of the Procedure: Protocol guidelines were followed. The patient was placed in position over the fluoroscopy table. The target area was identified and the area prepped in the usual manner. Skin & deeper tissues infiltrated with local anesthetic. Appropriate amount of time allowed to pass for local anesthetics to take effect. The procedure needles were then advanced to the target area. Proper needle placement secured. Negative aspiration confirmed. Solution injected in intermittent fashion, asking for systemic symptoms every 0.5cc of injectate. The needles were then removed and the area cleansed, making sure to leave some of the prepping solution back to take advantage of its long term bactericidal properties.  5cc solution made of 2 cc of preservative-free saline, 2 cc of 0.2% ropivacaine, 1 cc of Decadron 10 mg/cc.  LEFT PIRIFORMIS TPI Afterwards a LEFT piriformis trigger point injection was done 1 cm inferior, 1 cm deep, 1 cm lateral to the inferior fissure of the SI joint.  Contrast was injected to confirm piriformis muscle striation. 5 cc solution made of 4cc of 0.2% ropivacaine, 1 cc of Decadron 10 mg/cc.  While injecting, patient  did not complain of any pain radiating down her left leg.    Vitals:   09/12/21 1012 09/12/21 1035 09/12/21 1045 09/12/21 1055  BP: 121/69 (!) 144/80 132/78 133/75  Pulse: 77 78 79 76  Resp: 16 20 (!) 22 (!) 21  Temp: (!) 97.3 F (36.3 C)     TempSrc: Temporal     SpO2: 100% 98% 99% 99%  Weight: 98 lb (44.5 kg)     Height: 5' (1.524 m)  Start Time: 1045 hrs. End Time: 1051 hrs.  Post-operative Assessment:  Post-procedure Vital Signs:  Pulse/HCG Rate: 76  Temp:  (!) 97.3 F (36.3 C) Resp:  (!) 21 BP: 133/75 SpO2: 99 %  EBL: None  Complications: No immediate post-treatment complications observed by team, or reported by patient.  Note: The patient tolerated the entire procedure well. A repeat set of vitals were taken after the procedure and the patient was kept under observation following institutional policy, for this type of procedure. Post-procedural neurological assessment was performed, showing return to baseline, prior to discharge. The patient was provided with post-procedure discharge instructions, including a section on how to identify potential problems. Should any problems arise concerning this procedure, the patient was given instructions to immediately contact us, at any time, without hesitation. In any case, we plan to contact the patient by telephone for a follow-up status report regarding this interventional procedure.  Comments:  No additional relevant information.  Plan of Care  Orders:  Orders Placed This Encounter  Procedures   DG PAIN [facility] C-ARM 1-60 MIN NO REPORT    Intraoperative interpretation by procedural physician at [place].    Standing Status:   Standing    Number of Occurrences:   1    Order Specific Question:   Reason for exam:    Answer:   Assistance in needle guidance and placement for procedures requiring needle placement in or near specific anatomical locations not easily accessible without such assistance.    Chronic Opioid Analgesic:  Butrans patch 71mg/hr, continue tramadol 50 mg TID prn    Medications ordered for procedure: Meds ordered this encounter  Medications   iohexol (OMNIPAQUE) 180 MG/ML injection 10 mL    Must be Myelogram-compatible. If not available, you may substitute with a water-soluble, non-ionic, hypoallergenic, myelogram-compatible radiological contrast medium.   lidocaine (XYLOCAINE) 2 % (with pres) injection 400 mg   dexamethasone (DECADRON) injection 10 mg   sodium chloride flush (NS) 0.9 % injection 2 mL   ropivacaine (PF) 2 mg/mL (0.2%) (NAROPIN) injection 2 mL   dexamethasone (DECADRON) injection 10 mg   Medications administered: We administered iohexol, lidocaine, dexamethasone, sodium chloride flush, ropivacaine (PF) 2 mg/mL (0.2%), and dexamethasone.  See the medical record for exact dosing, route, and time of administration.  Follow-up plan:   Return in about 4 weeks (around 10/10/2021) for Post Procedure Evaluation, virtual.       Left caudal and left pirformis TPI 09/12/21  Recent Visits Date Type Provider Dept  09/04/21 Office Visit LGillis Santa MD [facility]  08/08/21 Procedure visit LGillis Santa MD [facility]  07/11/21 Office Visit LGillis Santa MD [facility]  06/25/21 Procedure visit LGillis Santa MD [facility]  Showing recent visits within past 90 days and meeting all other requirements Today's Visits Date Type Provider Dept  09/12/21 Procedure visit LGillis Santa MD [facility]  Showing today's visits and meeting all other requirements Future Appointments Date Type Provider Dept  10/08/21 Appointment LGillis Santa MD [facility]  Showing future appointments within next 90 days and meeting all other requirements  Disposition: Discharge home  Discharge (Date  Time): 09/12/2021; 1055 hrs.   Primary Care Physician: KGladstone Lighter MD [facility] Note by: BGillis Santa MD Date: 09/12/2021; Time: 10:59 AM  Disclaimer:  Medicine is not an exact science. The only guarantee in medicine is that nothing is guaranteed. It is important to note that the decision  to proceed with this intervention was based on the information collected from the patient. The Data and conclusions were drawn from the patient's questionnaire, the interview, and the physical examination. Because the information was provided in large part by the patient, it cannot be guaranteed that it has not been purposely or unconsciously manipulated. Every effort has been made to obtain as much relevant data as possible for this evaluation. It is important to note that the conclusions that lead to this procedure are derived in large part from the available data. Always take into account that the treatment will also be dependent on availability of resources and existing treatment guidelines, considered by other Pain Management Practitioners as being common knowledge and practice, at the time of the intervention. For Medico-Legal purposes, it is also important to point out that variation in procedural techniques and pharmacological choices are the acceptable norm. The indications, contraindications, technique, and results of the above procedure should only be interpreted and judged by a Board-Certified Interventional Pain Specialist with extensive familiarity and expertise in the same exact procedure and technique.

## 2021-09-13 ENCOUNTER — Telehealth: Payer: Self-pay | Admitting: *Deleted

## 2021-09-13 NOTE — Telephone Encounter (Signed)
No problem post procedure.

## 2021-10-01 ENCOUNTER — Other Ambulatory Visit: Payer: Self-pay | Admitting: Internal Medicine

## 2021-10-01 DIAGNOSIS — Z1231 Encounter for screening mammogram for malignant neoplasm of breast: Secondary | ICD-10-CM

## 2021-10-04 ENCOUNTER — Telehealth: Payer: Self-pay

## 2021-10-04 NOTE — Telephone Encounter (Signed)
LM for patient to call office for pre virtual appointment questions.  

## 2021-10-08 ENCOUNTER — Ambulatory Visit
Payer: Medicare Other | Attending: Student in an Organized Health Care Education/Training Program | Admitting: Student in an Organized Health Care Education/Training Program

## 2021-10-08 ENCOUNTER — Encounter: Payer: Self-pay | Admitting: Student in an Organized Health Care Education/Training Program

## 2021-10-08 DIAGNOSIS — G5702 Lesion of sciatic nerve, left lower limb: Secondary | ICD-10-CM

## 2021-10-08 DIAGNOSIS — M5416 Radiculopathy, lumbar region: Secondary | ICD-10-CM

## 2021-10-08 DIAGNOSIS — M792 Neuralgia and neuritis, unspecified: Secondary | ICD-10-CM | POA: Diagnosis not present

## 2021-10-08 DIAGNOSIS — M961 Postlaminectomy syndrome, not elsewhere classified: Secondary | ICD-10-CM

## 2021-10-08 DIAGNOSIS — G5703 Lesion of sciatic nerve, bilateral lower limbs: Secondary | ICD-10-CM

## 2021-10-08 DIAGNOSIS — G894 Chronic pain syndrome: Secondary | ICD-10-CM

## 2021-10-08 DIAGNOSIS — G8929 Other chronic pain: Secondary | ICD-10-CM

## 2021-10-08 NOTE — Progress Notes (Signed)
Patient: Julie Mckinney  Service Category: E/M  Provider: Gillis Santa, MD  DOB: [birth year]  DOS: 10/08/2021  Location: Office  MRN: [id number]  Setting: Ambulatory outpatient  Referring Provider: Gladstone Lighter, MD  Type: Established Patient  Specialty: Interventional Pain Management  PCP: Gladstone Lighter, MD  Location: Remote location  Delivery: TeleHealth     Virtual Encounter - Pain Management PROVIDER NOTE: Information contained herein reflects review and annotations entered in association with encounter. Interpretation of such information and data should be left to medically-trained personnel. Information provided to patient can be located elsewhere in the medical record under "Patient Instructions". Document created using STT-dictation technology, any transcriptional errors that may result from process are unintentional.    Contact & Pharmacy Preferred: [phone] Home: [phone] (home) Mobile: [phone] (mobile) E-mail: [email or web address], [place] - [address] [place] [address] Phone: [phone] Fax: [phone]   Pre-screening  [name] offered "in-person" vs "virtual" encounter. She indicated preferring virtual for this encounter.   Reason COVID-19*  Social distancing based on CDC and AMA recommendations.   [name] on 10/08/2021 via telephone.      I clearly identified myself as Gillis Santa, MD. I verified that I was speaking with the correct person using two identifiers (Name: Byrdie Miyazaki, and date of birth: [birth year]).  Consent I sought verbal advanced consent from Julie Mckinney for virtual visit interactions. I informed Julie Mckinney of possible security and privacy concerns, risks, and limitations associated with providing "not-in-person" medical evaluation and management services. I also informed Julie Mckinney of the availability of "in-person"  appointments. Finally, I informed her that there would be a charge for the virtual visit and that she could be  personally, fully or partially, financially responsible for it. Julie Mckinney expressed understanding and agreed to proceed.   Historic Elements   Julie Mckinney is a 87 y.o. year old, female patient evaluated today after our last contact on 09/12/2021. Julie Mckinney  has a past medical history of Anxiety, Asthma, Breast cancer ([place]) (Right), COPD (chronic obstructive pulmonary disease) ([place]), Depression, and Personal history of radiation therapy. She also  has a past surgical history that includes Appendectomy; Colonoscopy; Cholecystectomy; Abdominal hysterectomy; Esophagogastroduodenoscopy (egd) with propofol (N/A, 08/14/2016); Colonoscopy with propofol (N/A, 08/14/2016); Back surgery; Breast biopsy (Right, 2015); Breast excisional biopsy (Right, 1975); and Breast lumpectomy (Right, 2015). Julie Mckinney has a current medication list which includes the following prescription(s): acetaminophen, albuterol, buprenorphine, buspirone, calcium carbonate, citalopram, clobetasol cream, trelegy ellipta, glucosamine sulfate, levothyroxine, multi-vitamins, nebulizers, potassium, tolterodine, tramadol, and cyanocobalamin. She  reports that she quit smoking about 26 years ago. Her smoking use included cigarettes. She has a 15.00 pack-year smoking history. She has never used smokeless tobacco. She reports current alcohol use of about 1.0 standard drink of alcohol per week. She reports that she does not use drugs. Julie Mckinney is allergic to tape.   HPI  Today, she is being contacted for a post-procedure assessment.   Post-procedure evaluation   Procedure: Caudal Epidural Steroid Injection  & Left Piriformis TPI   Laterality: Midline aiming left Level: Sacrococcygeal ligament  Imaging: Fluoroscopy-guided         Anesthesia: Local anesthesia (1-2% Lidocaine)  DOS: 09/12/2021  Performed by: Gillis Santa, MD  Purpose: Diagnostic/Therapeutic Indications: Low back and lower extremity pain severe enough to impact quality of life or function. Rationale (medical necessity): procedure needed and proper for the diagnosis and/or treatment of Ms.  Mckinney medical symptoms and needs. 1. Chronic radicular lumbar pain (LEFT)   2. Piriformis syndrome of left side   3. Chronic pain syndrome    NAS-11 Pain score:   Pre-procedure: 9 /10   Post-procedure: 7  (GETTING OFF BED AND WALKING)/10      Effectiveness:  Initial hour after procedure: 100 %  Subsequent 4-6 hours post-procedure: 100 %  Analgesia past initial 6 hours: 75 % (current)  Ongoing improvement:  Analgesic:  75% Function: Julie Mckinney reports improvement in function ROM: Back to baseline   Pharmacotherapy Assessment   Opioid Analgesic: Butrans patch 66mg/hr, continue tramadol 50 mg TID prn    Monitoring: [place] PMP: PDMP not reviewed this encounter.       Pharmacotherapy: No side-effects or adverse reactions reported. Compliance: No problems identified. Effectiveness: Clinically acceptable. Plan: Refer to "POC". UDS: No results found for: "SUMMARY" No results found for: "CBDTHCR", "D8THCCBX", "D9THCCBX"   Laboratory Chemistry Profile   Renal Lab Results  Component Value Date   BUN 27 (H) 01/19/2019   CREATININE 0.56 01/19/2019   GFRAA >60 01/19/2019   GFRNONAA >60 01/19/2019    Hepatic Lab Results  Component Value Date   AST 20 01/17/2019   ALT 18 01/17/2019   ALBUMIN 3.5 01/17/2019   ALKPHOS 34 (L) 01/17/2019    Electrolytes Lab Results  Component Value Date   NA 138 03/07/2021   K 4.0 01/19/2019   CL 111 01/19/2019   CALCIUM 8.5 (L) 01/19/2019    Bone No results found for: "VD25OH", "VD125OH2TOT", "VMV7846NG2, "VXB2841LK4, "25OHVITD1", "25OHVITD2", "25OHVITD3", "TESTOFREE", "TESTOSTERONE"  Inflammation (CRP: Acute Phase) (ESR: Chronic Phase) No results found for: "CRP", "ESRSEDRATE", "LATICACIDVEN"        Note: Above Lab results reviewed.   Assessment  The primary encounter diagnosis was Chronic radicular lumbar pain (LEFT). Diagnoses of Piriformis syndrome of left side, Intractable neuropathic pain of left foot, Lumbar post-laminectomy syndrome, Failed back surgical syndrome, Chronic pain syndrome, and Piriformis syndrome of both sides were also pertinent to this visit.  Plan of Care  Patient endorsing therapeutic benefit from caudal ESI and states that the radiating pain in her legs is much better.  She continues to struggle with bilateral piriformis pain and states that even when she is working with her therapist, she has difficulty with the piriformis muscle.  She states that the injection did provide some pain relief but not as much as the caudal did.  We discussed repeating bilateral piriformis trigger point injection under fluoroscopy.  Risk and benefits reviewed and patient would like to proceed.     Orders:  Orders Placed This Encounter  Procedures   TRIGGER POINT INJECTION    Area: Buttocks region (gluteal area) Indications: Piriformis muscle pain; Bilateral (G57.03) piriformis-syndrome; piriformis muscle spasms ((M01.027. CPT code: 20552    Standing Status:   Future    Standing Expiration Date:   02/07/2022    Scheduling Instructions:     Type: Myoneural block (TPI) of piriformis muscle.     Bilateral    Order Specific Question:   Where will this procedure be performed?    Answer:   [facility]   Follow-up plan:   Return in 23 days (on 10/31/2021) for B/L Piriformis TPI , in clinic [facility].     Left caudal and left pirformis TPI 09/12/21   Recent Visits Date Type Provider Dept  09/12/21 Procedure visit LGillis Santa MD [facility]  09/04/21 Office Visit LGillis Santa MD [facility]  08/08/21 Procedure visit Gillis Santa, MD [facility]  07/11/21 Office Visit Gillis Santa, MD [facility]  Showing recent visits within  past 90 days and meeting all other requirements Today's Visits Date Type Provider Dept  10/08/21 Office Visit Gillis Santa, MD [facility]  Showing today's visits and meeting all other requirements Future Appointments No visits were found meeting these conditions. Showing future appointments within next 90 days and meeting all other requirements  I discussed the assessment and treatment plan with the patient. The patient was provided an opportunity to ask questions and all were answered. The patient agreed with the plan and demonstrated an understanding of the instructions.  Patient advised to call back or seek an in-person evaluation if the symptoms or condition worsens.  Duration of encounter: 39mnutes.  Note by: BGillis Santa MD Date: 10/08/2021; Time: 1:58 PM

## 2021-10-15 ENCOUNTER — Telehealth: Payer: Self-pay | Admitting: Student in an Organized Health Care Education/Training Program

## 2021-10-15 NOTE — Telephone Encounter (Signed)
Spoke with patient.  States she fell and she wanted to see if it was ok to have her procedure.  She did not go to doctor after falling. States she is just sore and bruised.  Instructed patient to wait till closer to her procedure and to call us and cancel if she feels like she cant come in and have it.  Patient states understanding.

## 2021-10-15 NOTE — Telephone Encounter (Signed)
Patient has fallen and is bruised up and sore. Has appt Mon 10-29-21 for procedure. She wants to know is she should still have procedure. Wants to talk to a nurse

## 2021-10-29 ENCOUNTER — Ambulatory Visit
Admission: RE | Admit: 2021-10-29 | Discharge: 2021-10-29 | Disposition: A | Discharge status: Home / Self Care | Payer: Medicare Other | Source: Ambulatory Visit | Attending: Student in an Organized Health Care Education/Training Program | Admitting: Student in an Organized Health Care Education/Training Program

## 2021-10-29 ENCOUNTER — Other Ambulatory Visit: Payer: Self-pay

## 2021-10-29 ENCOUNTER — Encounter: Payer: Self-pay | Admitting: Student in an Organized Health Care Education/Training Program

## 2021-10-29 ENCOUNTER — Ambulatory Visit
Payer: Medicare Other | Attending: Student in an Organized Health Care Education/Training Program | Admitting: Student in an Organized Health Care Education/Training Program

## 2021-10-29 VITALS — BP 143/73 | HR 78 | Temp 97.9°F | Resp 18 | Ht 60.0 in | Wt 95.0 lb

## 2021-10-29 DIAGNOSIS — G5702 Lesion of sciatic nerve, left lower limb: Secondary | ICD-10-CM | POA: Insufficient documentation

## 2021-10-29 DIAGNOSIS — G5703 Lesion of sciatic nerve, bilateral lower limbs: Secondary | ICD-10-CM | POA: Diagnosis present

## 2021-10-29 MED ORDER — DEXAMETHASONE SODIUM PHOSPHATE 10 MG/ML IJ SOLN
INTRAMUSCULAR | Status: AC
  Filled 2021-10-29: qty 1

## 2021-10-29 MED ORDER — DEXAMETHASONE SODIUM PHOSPHATE 10 MG/ML IJ SOLN
10.0000 mg | Freq: Once | INTRAMUSCULAR | Status: AC
  Administered 2021-10-29: 10 mg

## 2021-10-29 MED ORDER — LIDOCAINE HCL (PF) 2 % IJ SOLN
INTRAMUSCULAR | Status: AC
  Filled 2021-10-29: qty 10

## 2021-10-29 MED ORDER — IOHEXOL 180 MG/ML  SOLN
INTRAMUSCULAR | Status: AC
  Filled 2021-10-29: qty 20

## 2021-10-29 MED ORDER — ROPIVACAINE HCL 2 MG/ML IJ SOLN
9.0000 mL | Freq: Once | INTRAMUSCULAR | Status: AC
  Administered 2021-10-29: 9 mL via PERINEURAL

## 2021-10-29 MED ORDER — LIDOCAINE HCL 2 % IJ SOLN
20.0000 mL | Freq: Once | INTRAMUSCULAR | Status: AC
  Administered 2021-10-29: 100 mg

## 2021-10-29 MED ORDER — IOHEXOL 180 MG/ML  SOLN
10.0000 mL | Freq: Once | INTRAMUSCULAR | Status: AC
  Administered 2021-10-29: 10 mL via INTRA_ARTICULAR

## 2021-10-29 MED ORDER — ROPIVACAINE HCL 2 MG/ML IJ SOLN
INTRAMUSCULAR | Status: AC
  Filled 2021-10-29: qty 20

## 2021-10-29 NOTE — Progress Notes (Signed)
PROVIDER NOTE: Information contained herein reflects review and annotations entered in association with encounter. Interpretation of such information and data should be left to medically-trained personnel. Information provided to patient can be located elsewhere in the medical record under "Patient Instructions". Document created using STT-dictation technology, any transcriptional errors that may result from process are unintentional.    Patient: Julie Mckinney  Service Category: Procedure  Provider: Gillis Santa, MD  DOB: 29-Nov-1934  DOS: 10/29/2021  Location: [facility]  MRN: [id number]  Setting: Ambulatory - outpatient  Referring Provider: Gladstone Lighter, MD  Type: Established Patient  Specialty: Interventional Pain Management  PCP: Gladstone Lighter, MD   Primary Reason for Visit: Interventional Pain Management Treatment. CC: Back Pain (Bilateral lower back and leg pain )   Procedure:          Anesthesia, Analgesia, Anxiolysis:  Type: piriformis injection TPI under fluoroscopy  Region: Inferior Lumbosacral Region Level: PIIS (Posterior Inferior Iliac Spine) Laterality: Bilateral  Type: Local Anesthesia  Local Anesthetic: Lidocaine 1-2%  Position: Prone           Indications: 1. Piriformis syndrome of both sides   2. Piriformis syndrome of left side     Pain Score: Pre-procedure: 8 /10 Post-procedure: 8 /10   Pre-op H&P Assessment:  Ms. Longstreth is a 87 y.o. (year old), female patient, seen today for interventional treatment. She  has a past surgical history that includes Appendectomy; Colonoscopy; Cholecystectomy; Abdominal hysterectomy; Esophagogastroduodenoscopy (egd) with propofol (N/A, 08/14/2016); Colonoscopy with propofol (N/A, 08/14/2016); Back surgery; Breast biopsy (Right, 2015); Breast excisional biopsy (Right, 1975); and Breast lumpectomy (Right, 2015). Ms. Wilcock has a current medication list which includes the following prescription(s):  acetaminophen, albuterol, buprenorphine, buspirone, calcium carbonate, citalopram, clobetasol cream, trelegy ellipta, glucosamine sulfate, levothyroxine, multi-vitamins, nebulizers, potassium, tolterodine, tramadol, and cyanocobalamin. Her primarily concern today is the Back Pain (Bilateral lower back and leg pain )   Initial Vital Signs:  Pulse/HCG Rate: 78ECG Heart Rate: 75 Temp: 97.9 F (36.6 C) Resp: 16 BP: 122/76 SpO2: 99 %  BMI: Estimated body mass index is 18.55 kg/m as calculated from the following:   Height as of this encounter: 5' (1.524 m).   Weight as of this encounter: 95 lb (43.1 kg).  Risk Assessment: Allergies: Reviewed. She is allergic to tape.  Allergy Precautions: None required Coagulopathies: Reviewed. None identified.  Blood-thinner therapy: None at this time Active Infection(s): Reviewed. None identified. Ms. Herskowitz is afebrile  Site Confirmation: Ms. Vanstone was asked to confirm the procedure and laterality before marking the site Procedure checklist: Completed Consent: Before the procedure and under the influence of no sedative(s), amnesic(s), or anxiolytics, the patient was informed of the treatment options, risks and possible complications. To fulfill our ethical and legal obligations, as recommended by the American Medical Association's Code of Ethics, I have informed the patient of my clinical impression; the nature and purpose of the treatment or procedure; the risks, benefits, and possible complications of the intervention; the alternatives, including doing nothing; the risk(s) and benefit(s) of the alternative treatment(s) or procedure(s); and the risk(s) and benefit(s) of doing nothing. The patient was provided information about the general risks and possible complications associated with the procedure. These may include, but are not limited to: failure to achieve desired goals, infection, bleeding, organ or nerve damage, allergic reactions, paralysis, and  death. In addition, the patient was informed of those risks and complications associated to the procedure, such as failure to decrease pain; infection; bleeding; organ or nerve  damage with subsequent damage to sensory, motor, and/or autonomic systems, resulting in permanent pain, numbness, and/or weakness of one or several areas of the body; allergic reactions; (i.e.: anaphylactic reaction); and/or death. Furthermore, the patient was informed of those risks and complications associated with the medications. These include, but are not limited to: allergic reactions (i.e.: anaphylactic or anaphylactoid reaction(s)); adrenal axis suppression; blood sugar elevation that in diabetics may result in ketoacidosis or comma; water retention that in patients with history of congestive heart failure may result in shortness of breath, pulmonary edema, and decompensation with resultant heart failure; weight gain; swelling or edema; medication-induced neural toxicity; particulate matter embolism and blood vessel occlusion with resultant organ, and/or nervous system infarction; and/or aseptic necrosis of one or more joints. Finally, the patient was informed that Medicine is not an exact science; therefore, there is also the possibility of unforeseen or unpredictable risks and/or possible complications that may result in a catastrophic outcome. The patient indicated having understood very clearly. We have given the patient no guarantees and we have made no promises. Enough time was given to the patient to ask questions, all of which were answered to the patient's satisfaction. Ms. Pinheiro has indicated that she wanted to continue with the procedure. Attestation: I, the ordering provider, attest that I have discussed with the patient the benefits, risks, side-effects, alternatives, likelihood of achieving goals, and potential problems during recovery for the procedure that I have provided informed consent. Date  Time:  10/29/2021  9:58 AM  Pre-Procedure Preparation:  Monitoring: As per clinic protocol. Respiration, ETCO2, SpO2, BP, heart rate and rhythm monitor placed and checked for adequate function Safety Precautions: Patient was assessed for positional comfort and pressure points before starting the procedure. Time-out: I initiated and conducted the "Time-out" before starting the procedure, as per protocol. The patient was asked to participate by confirming the accuracy of the "Time Out" information. Verification of the correct person, site, and procedure were performed and confirmed by me, the nursing staff, and the patient. "Time-out" conducted as per Joint Commission's Universal Protocol (UP.01.01.01). Time: 1026  Description of Procedure:          Target Area: Inferior, posterior, aspect of the sacroiliac fissure Approach: Posterior, paraspinal, ipsilateral approach. Area Prepped: Entire Lower Lumbosacral Region DuraPrep (Iodine Povacrylex [0.7% available iodine] and Isopropyl Alcohol, 74% w/w) Safety Precautions: Aspiration looking for blood return was conducted prior to all injections. At no point did we inject any substances, as a needle was being advanced. No attempts were made at seeking any paresthesias. Safe injection practices and needle disposal techniques used. Medications properly checked for expiration dates. SDV (single dose vial) medications used. Description of the Procedure: Protocol guidelines were followed. The patient was placed in position over the procedure table. The target area was identified and the area prepped in the usual manner. Skin & deeper tissues infiltrated with local anesthetic. Appropriate amount of time allowed to pass for local anesthetics to take effect. The procedure needle was advanced under fluoroscopic guidance into the sacroiliac joint until a firm endpoint was obtained. Proper needle placement secured. Negative aspiration confirmed. Solution injected in intermittent  fashion, asking for systemic symptoms every 0.5cc of injectate. The needles were then removed and the area cleansed, making sure to leave some of the prepping solution back to take advantage of its long term bactericidal properties. Vitals:   10/29/21 0959 10/29/21 1023 10/29/21 1028 10/29/21 1031  BP: 122/76 (!) 140/82 (!) 145/74 (!) 143/73  Pulse: 78  Resp: '16 18 16 18  '$ Temp: 97.9 F (36.6 C)     TempSrc: Temporal     SpO2: 99% 95% 95% 98%  Weight: 95 lb (43.1 kg)     Height: 5' (1.524 m)       Start Time: 1026 hrs. End Time: 1030 hrs. Materials:  Needle(s) Type: Spinal Needle Gauge: 25G Length: 3.5-in Medication(s):   a left piriformis trigger point injection was done 1 cm inferior, 1 cm deep, 1 cm lateral to the inferior fissure of the SI joint.   a right piriformis trigger point injection was done 1 cm inferior, 1 cm deep, 1 cm lateral to the inferior fissure of the SI joint  Contrast was injected to confirm piriformis muscle striation.  While injecting, patient did not complain of any pain radiating down her leg.  10 cc solution made of 9 cc of 0.2% ropivacaine, 1 cc of Decadron 10 mg/cc.  5 cc injected for the left piriformis, 5 cc injected for the right piriformis   Imaging Guidance (Non-Spinal):          Type of Imaging Technique: Fluoroscopy Guidance (Non-Spinal) Indication(s): Assistance in needle guidance and placement for procedures requiring needle placement in or near specific anatomical locations not easily accessible without such assistance. Exposure Time: Please see nurses notes. Contrast: Before injecting any contrast, we confirmed that the patient did not have an allergy to iodine, shellfish, or radiological contrast. Once satisfactory needle placement was completed at the desired level, radiological contrast was injected. Contrast injected under live fluoroscopy. No contrast complications. See chart for type and volume of contrast used. Fluoroscopic Guidance:  I was personally present during the use of fluoroscopy. "Tunnel Vision Technique" used to obtain the best possible view of the target area. Parallax error corrected before commencing the procedure. "Direction-depth-direction" technique used to introduce the needle under continuous pulsed fluoroscopy. Once target was reached, antero-posterior, oblique, and lateral fluoroscopic projection used confirm needle placement in all planes. Images permanently stored in EMR. Interpretation: I personally interpreted the imaging intraoperatively. Adequate needle placement confirmed in multiple planes. Appropriate spread of contrast into desired area was observed. No evidence of afferent or efferent intravascular uptake. Permanent images saved into the patient's record.   Post-operative Assessment:  Post-procedure Vital Signs:  Pulse/HCG Rate: 7875 Temp: 97.9 F (36.6 C) Resp: 18 BP: (!) 143/73 SpO2: 98 %  EBL: None  Complications: No immediate post-treatment complications observed by team, or reported by patient.  Note: The patient tolerated the entire procedure well. A repeat set of vitals were taken after the procedure and the patient was kept under observation following institutional policy, for this type of procedure. Post-procedural neurological assessment was performed, showing return to baseline, prior to discharge. The patient was provided with post-procedure discharge instructions, including a section on how to identify potential problems. Should any problems arise concerning this procedure, the patient was given instructions to immediately contact us, at any time, without hesitation. In any case, we plan to contact the patient by telephone for a follow-up status report regarding this interventional procedure.  Comments:  No additional relevant information.  Plan of Care  Orders:  Orders Placed This Encounter  Procedures   DG PAIN CLINIC C-ARM 1-60 MIN NO REPORT    Intraoperative interpretation  by procedural physician at [place].    Standing Status:   Standing    Number of Occurrences:   1    Order Specific Question:   Reason for exam:  Answer:   Assistance in needle guidance and placement for procedures requiring needle placement in or near specific anatomical locations not easily accessible without such assistance.   Chronic Opioid Analgesic:  Butrans patch 57mg/hr, continue tramadol 50 mg every 8 hours as needed    Medications ordered for procedure: Meds ordered this encounter  Medications   iohexol (OMNIPAQUE) 180 MG/ML injection 10 mL    Must be Myelogram-compatible. If not available, you may substitute with a water-soluble, non-ionic, hypoallergenic, myelogram-compatible radiological contrast medium.   lidocaine (XYLOCAINE) 2 % (with pres) injection 400 mg   dexamethasone (DECADRON) injection 10 mg   ropivacaine (PF) 2 mg/mL (0.2%) (NAROPIN) injection 9 mL   Medications administered: We administered iohexol, lidocaine, dexamethasone, and ropivacaine (PF) 2 mg/mL (0.2%).  See the medical record for exact dosing, route, and time of administration.  Follow-up plan:   Return in about 5 weeks (around 12/03/2021) for Post Procedure Evaluation, virtual.     Recent Visits Date Type Provider Dept  10/08/21 Office Visit LGillis Santa MD [facility]  09/12/21 Procedure visit LGillis Santa MD [facility]  09/04/21 Office Visit LGillis Santa MD [facility]  08/08/21 Procedure visit LGillis Santa MD [facility]  Showing recent visits within past 90 days and meeting all other requirements Today's Visits Date Type Provider Dept  10/29/21 Procedure visit LGillis Santa MD [facility]  Showing today's visits and meeting all other requirements Future Appointments Date Type Provider Dept  12/05/21 Appointment LGillis Santa MD [facility]  Showing future appointments within next 90 days and meeting all  other requirements  Disposition: Discharge home  Discharge (Date  Time): 10/29/2021; 1040 hrs.   Primary Care Physician: KGladstone Lighter MD [facility] Note by: BGillis Santa MD Date: 10/29/2021; Time: 10:54 AM  Disclaimer:  Medicine is not an exact science. The only guarantee in medicine is that nothing is guaranteed. It is important to note that the decision to proceed with this intervention was based on the information collected from the patient. The Data and conclusions were drawn from the patient's questionnaire, the interview, and the physical examination. Because the information was provided in large part by the patient, it cannot be guaranteed that it has not been purposely or unconsciously manipulated. Every effort has been made to obtain as much relevant data as possible for this evaluation. It is important to note that the conclusions that lead to this procedure are derived in large part from the available data. Always take into account that the treatment will also be dependent on availability of resources and existing treatment guidelines, considered by other Pain Management Practitioners as being common knowledge and practice, at the time of the intervention. For Medico-Legal purposes, it is also important to point out that variation in procedural techniques and pharmacological choices are the acceptable norm. The indications, contraindications, technique, and results of the above procedure should only be interpreted and judged by a Board-Certified Interventional Pain Specialist with extensive familiarity and expertise in the same exact procedure and technique.

## 2021-10-29 NOTE — Patient Instructions (Signed)
Pain Management Discharge Instructions  General Discharge Instructions :  If you need to reach your doctor call: Monday-Friday 8:00 am - 4:00 pm at [phone] or toll free [phone].  After clinic hours [phone] to have operator reach doctor.  Bring all of your medication bottles to all your appointments in the pain clinic.  To cancel or reschedule your appointment with Pain Management please remember to call 24 hours in advance to avoid a fee.  Refer to the educational materials which you have been given on: General Risks, I had my Procedure. Discharge Instructions, Post Sedation.  Post Procedure Instructions:  The drugs you were given will stay in your system until tomorrow, so for the next 24 hours you should not drive, make any legal decisions or drink any alcoholic beverages.  You may eat anything you prefer, but it is better to start with liquids then soups and crackers, and gradually work up to solid foods.  Please notify your doctor immediately if you have any unusual bleeding, trouble breathing or pain that is not related to your normal pain.  Depending on the type of procedure that was done, some parts of your body may feel week and/or numb.  This usually clears up by tonight or the next day.  Walk with the use of an assistive device or accompanied by an adult for the 24 hours.  You may use ice on the affected area for the first 24 hours.  Put ice in a Ziploc bag and cover with a towel and place against area 15 minutes on 15 minutes off.  You may switch to heat after 24 hours.Trigger Point Injection Trigger points are areas where you have pain. A trigger point injection is a shot given in the trigger point to help relieve pain for a few days to a few months. Common places for trigger points include the neck, shoulders, upper back, or lower back. A trigger point injection will not cure long-term (chronic) pain permanently. These injections do not always work for every  person. For some people, they can help to relieve pain for a few days to a few months. Tell a health care provider about: Any allergies you have. All medicines you are taking, including vitamins, herbs, eye drops, creams, and over-the-counter medicines. Any problems you or family members have had with anesthetic medicines. Any bleeding problems you have. Any surgeries you have had. Any medical conditions you have. Whether you are pregnant or may be pregnant. What are the risks? Generally, this is a safe procedure. However, problems may occur, including: Infection. Bleeding or bruising. Allergic reaction to the injected medicine. Irritation of the skin around the injection site. What happens before the procedure? Ask your health care provider about: Changing or stopping your regular medicines. This is especially important if you are taking diabetes medicines or blood thinners. Taking medicines such as aspirin and ibuprofen. These medicines can thin your blood. Do not take these medicines unless your health care provider tells you to take them. Taking over-the-counter medicines, vitamins, herbs, and supplements. What happens during the procedure?  Your health care provider will feel for trigger points. A marker may be used to circle the area for the injection. The skin over the trigger point will be washed with a germ-killing soap. You may be given a medicine to help you relax (sedative). A thin needle is used for the injection. You may feel pain or a twitching feeling when the needle enters your skin. A numbing solution may be injected   into the trigger point. Sometimes a medicine to keep down inflammation is also injected. Your health care provider may move the needle around the area where the trigger point is located until the tightness and twitching goes away. After the injection, your health care provider may put gentle pressure over the injection site. The injection site will be  covered with a bandage (dressing). The procedure may vary among health care providers and hospitals. What can I expect after treatment? After treatment, you may have soreness and stiffness for 1-2 days. Follow these instructions at home: Injection site care Remove your dressing in a few hours, or as told by your health care provider. Check your injection site every day for signs of infection. Check for: Redness, swelling, or pain. Fluid or blood. Warmth. Pus or a bad smell. Managing pain, stiffness, and swelling If directed, put ice on the affected area. To do this: Put ice in a plastic bag. Place a towel between your skin and the bag. Leave the ice on for 20 minutes, 2-3 times a day. Remove the ice if your skin turns bright red. This is very important. If you cannot feel pain, heat, or cold, you have a greater risk of damage to the area. Activity If you were given a sedative during the procedure, it can affect you for several hours. Do not drive or operate machinery until your health care provider says that it is safe. Do not take baths, swim, or use a hot tub until your health care provider approves. Return to your normal activities as told by your health care provider. Ask your health care provider what activities are safe for you. General instructions If you were asked to stop your regular medicines, ask your health care provider when you may start taking them again. You may be asked to see an occupational or physical therapist for exercises that reduce muscle strain and stretch the area of the trigger point. Keep all follow-up visits. This is important. Contact a health care provider if: Your pain comes back, and it is worse than before the injection. You may need more injections. You have chills or a fever. The injection site becomes more painful, red, swollen, or warm to the touch. Summary A trigger point injection is a shot given in the trigger point to help relieve  pain. Common places for trigger point injections are the neck, shoulders, upper back, and lower back. These injections do not always work for every person, but for some people, the injections can help to relieve pain for a few days to a few months. Contact a health care provider if symptoms come back or if they are worse than before treatment. Also, get help if the injection site becomes more painful, red, swollen, or warm to the touch. This information is not intended to replace advice given to you by your health care provider. Make sure you discuss any questions you have with your health care provider. Document Revised: 04/18/2020 Document Reviewed: 04/18/2020 Elsevier Patient Education  2023 [employer]  

## 2021-10-29 NOTE — Progress Notes (Signed)
Safety precautions to be maintained throughout the outpatient stay will include: orient to surroundings, keep bed in low position, maintain call bell within reach at all times, provide assistance with transfer out of bed and ambulation.  

## 2021-10-30 ENCOUNTER — Telehealth: Payer: Self-pay

## 2021-10-30 NOTE — Telephone Encounter (Signed)
Post procedure phone call.  LM 

## 2021-12-04 ENCOUNTER — Telehealth: Payer: Self-pay

## 2021-12-04 NOTE — Telephone Encounter (Signed)
Called for PPE on 12/05/21  no answer, left message to call office so we could review.

## 2021-12-05 ENCOUNTER — Encounter: Payer: Self-pay | Admitting: Student in an Organized Health Care Education/Training Program

## 2021-12-05 ENCOUNTER — Ambulatory Visit
Payer: Medicare Other | Attending: Student in an Organized Health Care Education/Training Program | Admitting: Student in an Organized Health Care Education/Training Program

## 2021-12-05 DIAGNOSIS — G5703 Lesion of sciatic nerve, bilateral lower limbs: Secondary | ICD-10-CM

## 2021-12-05 DIAGNOSIS — G5702 Lesion of sciatic nerve, left lower limb: Secondary | ICD-10-CM

## 2021-12-05 NOTE — Progress Notes (Signed)
I attempted to call the patient however no response.  -Dr Terisa Belardo  

## 2021-12-10 ENCOUNTER — Ambulatory Visit
Payer: Medicare Other | Attending: Student in an Organized Health Care Education/Training Program | Admitting: Student in an Organized Health Care Education/Training Program

## 2021-12-10 ENCOUNTER — Encounter: Payer: Self-pay | Admitting: Student in an Organized Health Care Education/Training Program

## 2021-12-10 DIAGNOSIS — G5703 Lesion of sciatic nerve, bilateral lower limbs: Secondary | ICD-10-CM

## 2021-12-10 NOTE — Progress Notes (Signed)
I attempted to call the patient however no response. Voicemail left instructing patient to call front desk office at [phone] to reschedule appointment. -Dr Jasten Guyette  

## 2021-12-18 ENCOUNTER — Encounter: Payer: Self-pay | Admitting: Student in an Organized Health Care Education/Training Program

## 2021-12-18 ENCOUNTER — Ambulatory Visit
Payer: Medicare Other | Attending: Student in an Organized Health Care Education/Training Program | Admitting: Student in an Organized Health Care Education/Training Program

## 2021-12-18 DIAGNOSIS — G5702 Lesion of sciatic nerve, left lower limb: Secondary | ICD-10-CM | POA: Diagnosis not present

## 2021-12-18 DIAGNOSIS — M961 Postlaminectomy syndrome, not elsewhere classified: Secondary | ICD-10-CM

## 2021-12-18 DIAGNOSIS — G894 Chronic pain syndrome: Secondary | ICD-10-CM

## 2021-12-18 DIAGNOSIS — G5703 Lesion of sciatic nerve, bilateral lower limbs: Secondary | ICD-10-CM | POA: Diagnosis not present

## 2021-12-18 DIAGNOSIS — M792 Neuralgia and neuritis, unspecified: Secondary | ICD-10-CM | POA: Diagnosis not present

## 2021-12-18 MED ORDER — BUPRENORPHINE 20 MCG/HR TD PTWK: 1.0000 | MEDICATED_PATCH | TRANSDERMAL | 2 refills | Status: AC

## 2021-12-18 MED ORDER — TRAMADOL HCL 50 MG PO TABS: 50.0000 mg | ORAL_TABLET | Freq: Three times a day (TID) | ORAL | 2 refills | Status: DC | PRN

## 2021-12-18 NOTE — Progress Notes (Signed)
Patient: Julie Mckinney  Service Category: E/M  Provider: Gillis Santa, MD  DOB: 09-Jun-1934  DOS: 12/18/2021  Location: Office  MRN: [id number]  Setting: Ambulatory outpatient  Referring Provider: Gladstone Lighter, MD  Type: Established Patient  Specialty: Interventional Pain Management  PCP: Gladstone Lighter, MD  Location: Remote location  Delivery: TeleHealth     Virtual Encounter - Pain Management PROVIDER NOTE: Information contained herein reflects review and annotations entered in association with encounter. Interpretation of such information and data should be left to medically-trained personnel. Information provided to patient can be located elsewhere in the medical record under "Patient Instructions". Document created using STT-dictation technology, any transcriptional errors that may result from process are unintentional.    Contact & Pharmacy Preferred: [phone] Home: [phone] (home) Mobile: [phone] (mobile) E-mail: [email or web address] .[email or web address], [place] [address] Phone: [phone] Fax: [phone]   Pre-screening  [name] offered "in-person" vs "virtual" encounter. She indicated preferring virtual for this encounter.   Reason COVID-19*  Social distancing based on CDC and AMA recommendations.   [name] on 12/18/2021 via telephone.      I clearly identified myself as Gillis Santa, MD. I verified that I was speaking with the correct person using two identifiers (Name: Imberly Troxler, and date of birth: 04-25-1934).  Consent I sought verbal advanced consent from Julie Mckinney for virtual visit interactions. I informed Julie Mckinney of possible security and privacy concerns, risks, and limitations associated with providing "not-in-person" medical evaluation and management services. I also informed Julie Mckinney of the availability of "in-person"  appointments. Finally, I informed her that there would be a charge for the virtual visit and that she could be  personally, fully or partially, financially responsible for it. Julie Mckinney expressed understanding and agreed to proceed.   Historic Elements   Julie Mckinney is a 87 y.o. year old, female patient evaluated today after our last contact on 12/10/2021. Julie Mckinney  has a past medical history of Anxiety, Asthma, Breast cancer ([place]) (Right), COPD (chronic obstructive pulmonary disease) ([place]), Depression, and Personal history of radiation therapy. She also  has a past surgical history that includes Appendectomy; Colonoscopy; Cholecystectomy; Abdominal hysterectomy; Esophagogastroduodenoscopy (egd) with propofol (N/A, 08/14/2016); Colonoscopy with propofol (N/A, 08/14/2016); Back surgery; Breast biopsy (Right, 2015); Breast excisional biopsy (Right, 1975); and Breast lumpectomy (Right, 2015). Julie Mckinney has a current medication list which includes the following prescription(s): buprenorphine, tramadol, acetaminophen, albuterol, buspirone, calcium carbonate, citalopram, clobetasol cream, trelegy ellipta, glucosamine sulfate, levothyroxine, multi-vitamins, nebulizers, potassium, tolterodine, and cyanocobalamin. She  reports that she quit smoking about 26 years ago. Her smoking use included cigarettes. She has a 15.00 pack-year smoking history. She has never used smokeless tobacco. She reports current alcohol use of about 1.0 standard drink of alcohol per week. She reports that she does not use drugs. Julie Mckinney is allergic to tape.  Estimated body mass index is 18.55 kg/m as calculated from the following:   Height as of 10/29/21: 5' (1.524 m).   Weight as of 10/29/21: 95 lb (43.1 kg).  HPI  Today, she is being contacted for both, medication management and a post-procedure assessment.   Post-procedure evaluation  Bilateral Piriformis TPI  Effectiveness:  Initial hour after procedure: 100 %   Subsequent 4-6 hours post-procedure: 100 %  Analgesia past initial 6 hours: 40 %-50% Ongoing improvement:  Analgesic:  50% however patient states that the buttock pain is returning and  she would like to repeat    Pharmacotherapy Assessment   Opioid Analgesic: Butrans patch 10mg/hr, continue tramadol 50 mg every 8 hours as needed    Monitoring:  PMP: PDMP reviewed during this encounter.       Pharmacotherapy: No side-effects or adverse reactions reported. Compliance: No problems identified. Effectiveness: Clinically acceptable. Plan: Refer to "POC". UDS: No results found for: "SUMMARY" No results found for: "CBDTHCR", "D8THCCBX", "D9THCCBX"   Laboratory Chemistry Profile   Renal Lab Results  Component Value Date   BUN 27 (H) 01/19/2019   CREATININE 0.56 01/19/2019   GFRAA >60 01/19/2019   GFRNONAA >60 01/19/2019    Hepatic Lab Results  Component Value Date   AST 20 01/17/2019   ALT 18 01/17/2019   ALBUMIN 3.5 01/17/2019   ALKPHOS 34 (L) 01/17/2019    Electrolytes Lab Results  Component Value Date   NA 138 03/07/2021   K 4.0 01/19/2019   CL 111 01/19/2019   CALCIUM 8.5 (L) 01/19/2019    Bone No results found for: "VD25OH", "VD125OH2TOT", "VEV0350KX3, "VGH8299BZ1, "25OHVITD1", "25OHVITD2", "25OHVITD3", "TESTOFREE", "TESTOSTERONE"  Inflammation (CRP: Acute Phase) (ESR: Chronic Phase) No results found for: "CRP", "ESRSEDRATE", "LATICACIDVEN"       Note: Above Lab results reviewed.   Assessment  The primary encounter diagnosis was Piriformis syndrome of both sides. Diagnoses of Piriformis syndrome of left side, Intractable neuropathic pain of left foot, Lumbar post-laminectomy syndrome, Failed back surgical syndrome, and Chronic pain syndrome were also pertinent to this visit.  Plan of Care    Julie Mckinney a current medication list which includes the following long-term medication(s): albuterol, calcium carbonate, citalopram, levothyroxine, and  potassium.  1. Piriformis syndrome of both sides - buprenorphine (BUTRANS) 20 MCG/HR PTWK; Place 1 patch onto the skin once a week.  Dispense: 4 patch; Refill: 2 - traMADol (ULTRAM) 50 MG tablet; Take 1 tablet (50 mg total) by mouth every 8 (eight) hours as needed for severe pain. Must last 30 days  Dispense: 90 tablet; Refill: 2 - TRIGGER POINT INJECTION; Future  2. Piriformis syndrome of left side - buprenorphine (BUTRANS) 20 MCG/HR PTWK; Place 1 patch onto the skin once a week.  Dispense: 4 patch; Refill: 2 - traMADol (ULTRAM) 50 MG tablet; Take 1 tablet (50 mg total) by mouth every 8 (eight) hours as needed for severe pain. Must last 30 days  Dispense: 90 tablet; Refill: 2 - TRIGGER POINT INJECTION; Future  3. Intractable neuropathic pain of left foot - buprenorphine (BUTRANS) 20 MCG/HR PTWK; Place 1 patch onto the skin once a week.  Dispense: 4 patch; Refill: 2 - traMADol (ULTRAM) 50 MG tablet; Take 1 tablet (50 mg total) by mouth every 8 (eight) hours as needed for severe pain. Must last 30 days  Dispense: 90 tablet; Refill: 2  4. Lumbar post-laminectomy syndrome - buprenorphine (BUTRANS) 20 MCG/HR PTWK; Place 1 patch onto the skin once a week.  Dispense: 4 patch; Refill: 2 - traMADol (ULTRAM) 50 MG tablet; Take 1 tablet (50 mg total) by mouth every 8 (eight) hours as needed for severe pain. Must last 30 days  Dispense: 90 tablet; Refill: 2  5. Failed back surgical syndrome - buprenorphine (BUTRANS) 20 MCG/HR PTWK; Place 1 patch onto the skin once a week.  Dispense: 4 patch; Refill: 2 - traMADol (ULTRAM) 50 MG tablet; Take 1 tablet (50 mg total) by mouth every 8 (eight) hours as needed for severe pain. Must last 30 days  Dispense: 90 tablet; Refill: 2  6. Chronic pain syndrome - buprenorphine (BUTRANS) 20 MCG/HR PTWK; Place 1 patch onto the skin once a week.  Dispense: 4 patch; Refill: 2 - traMADol (ULTRAM) 50 MG tablet; Take 1 tablet (50 mg total) by mouth every 8 (eight) hours as  needed for severe pain. Must last 30 days  Dispense: 90 tablet; Refill: 2 - TRIGGER POINT INJECTION; Future    Pharmacotherapy (Medications Ordered): Meds ordered this encounter  Medications   buprenorphine (BUTRANS) 20 MCG/HR PTWK    Sig: Place 1 patch onto the skin once a week.    Dispense:  4 patch    Refill:  2    Chronic Pain: STOP Act (Not applicable) Fill 1 day early if closed on refill date. Avoid benzodiazepines within 8 hours of opioids   traMADol (ULTRAM) 50 MG tablet    Sig: Take 1 tablet (50 mg total) by mouth every 8 (eight) hours as needed for severe pain. Must last 30 days    Dispense:  90 tablet    Refill:  2    Chronic Pain: STOP Act (Not applicable) Fill 1 day early if closed on refill date. Avoid benzodiazepines within 8 hours of opioids   Orders:  Orders Placed This Encounter  Procedures   TRIGGER POINT INJECTION    Area: Buttocks region (gluteal area) Indications: Piriformis muscle pain; Bilateral (G57.03) piriformis-syndrome; piriformis muscle spasms (G64.847). CPT code: 20552    Standing Status:   Future    Standing Expiration Date:   12/19/2022    Scheduling Instructions:     Bilateral piriformis TPI without sedation    Order Specific Question:   Where will this procedure be performed?    Answer:   [facility]   Follow-up plan:   Return in about 5 weeks (around 01/22/2022) for B/L Piriformis TPI, in clinic [facility].    Recent Visits Date Type Provider Dept  12/10/21 Office Visit Gillis Santa, MD [facility]  12/05/21 Office Visit Gillis Santa, MD [facility]  10/29/21 Procedure visit Gillis Santa, MD [facility]  10/08/21 Office Visit Gillis Santa, MD [facility]  Showing recent visits within past 90 days and meeting all other requirements Today's Visits Date Type Provider Dept  12/18/21 Office Visit Gillis Santa, MD [facility]  Showing today's visits and meeting all other  requirements Future Appointments No visits were found meeting these conditions. Showing future appointments within next 90 days and meeting all other requirements  I discussed the assessment and treatment plan with the patient. The patient was provided an opportunity to ask questions and all were answered. The patient agreed with the plan and demonstrated an understanding of the instructions.  Patient advised to call back or seek an in-person evaluation if the symptoms or condition worsens.  Duration of encounter: 30 minutes.  Note by: Gillis Santa, MD Date: 12/18/2021; Time: 2:12 PM

## 2021-12-25 ENCOUNTER — Encounter: Payer: Self-pay | Admitting: Psychiatry

## 2021-12-25 ENCOUNTER — Ambulatory Visit (INDEPENDENT_AMBULATORY_CARE_PROVIDER_SITE_OTHER): Payer: Medicare Other | Admitting: Psychiatry

## 2021-12-25 VITALS — BP 148/77 | HR 84 | Temp 97.5°F | Ht 60.0 in | Wt 90.6 lb

## 2021-12-25 DIAGNOSIS — Z79899 Other long term (current) drug therapy: Secondary | ICD-10-CM | POA: Diagnosis not present

## 2021-12-25 DIAGNOSIS — G4701 Insomnia due to medical condition: Secondary | ICD-10-CM | POA: Diagnosis not present

## 2021-12-25 DIAGNOSIS — F3342 Major depressive disorder, recurrent, in full remission: Secondary | ICD-10-CM | POA: Diagnosis not present

## 2021-12-25 DIAGNOSIS — F411 Generalized anxiety disorder: Secondary | ICD-10-CM | POA: Diagnosis not present

## 2021-12-25 NOTE — Progress Notes (Unsigned)
[facility] MD OP Progress Note  12/25/2021 1:52 PM Julie Mckinney  MRN:  [id number]  Chief Complaint:  Chief Complaint  Patient presents with   Follow-up   Medication Refill   Anxiety   HPI: Julie Mckinney is a 87 year old Caucasian female, lives in a senior living community in [place], married, has a history of MDD, GAD, insomnia, COPD, chronic pain, rheumatoid arthritis, history of breast cancer in remission on tamoxifen, chronic pain was evaluated in office today.  Patient today appeared to be alert, oriented to person place time situation.  3 word memory immediately 3 out of 3, after 5 minutes 3 out of 3.  Patient was able to do serial three's, attention and focus seem to be good.  Patient today reports overall she is doing fairly well with regards to mood.  Denies any significant anxiety, depression.  Currently compliant on the Celexa and the BuSpar.  Denies side effects.  Reports sleep is overall good.  Reports she had a good Thanksgiving holiday.  Her husband continues to cook and take care of household chores.  Patient reports she tries to help out.  She and her husband are planning to move to [place] to be closer to her son in February 2024.  Their new house will be completed by then.  She is excited about that.  Patient denies any suicidality, homicidality or perceptual disturbances.  Patient denies any other concerns today.  Visit Diagnosis:    ICD-10-CM   1. MDD (major depressive disorder), recurrent, in full remission ([place])  F33.42     2. GAD (generalized anxiety disorder)  F41.1     3. Insomnia due to medical condition  G47.01    Pain    4. High risk medication use  Z79.899       Past Psychiatric History: Reviewed past psychiatric history from progress note on 10/23/2017.  Past trials of Celexa, mirtazapine, melatonin.  Past Medical History:  Past Medical History:  Diagnosis Date   Anxiety    Asthma    Breast cancer ([place]) Right   COPD (chronic obstructive  pulmonary disease) ([place])    Depression    Personal history of radiation therapy     Past Surgical History:  Procedure Laterality Date   ABDOMINAL HYSTERECTOMY     APPENDECTOMY     BACK SURGERY     BREAST BIOPSY Right 2015   +   BREAST EXCISIONAL BIOPSY Right 1975   neg   BREAST LUMPECTOMY Right 2015   invasive lobular carcinoma   CHOLECYSTECTOMY     COLONOSCOPY     Scheduled for July 2018   COLONOSCOPY WITH PROPOFOL N/A 08/14/2016   Procedure: COLONOSCOPY WITH PROPOFOL;  Surgeon: Manya Silvas, MD;  Location[facility] ENDOSCOPY;  Service: Endoscopy;  Laterality: N/A;   ESOPHAGOGASTRODUODENOSCOPY (EGD) WITH PROPOFOL N/A 08/14/2016   Procedure: ESOPHAGOGASTRODUODENOSCOPY (EGD) WITH PROPOFOL;  Surgeon: Manya Silvas, MD;  Location: [facility] ENDOSCOPY;  Service: Endoscopy;  Laterality: N/A;    Family Psychiatric History: Reviewed family psychiatric history from progress note on 10/23/2017.  Family History:  Family History  Problem Relation Age of Onset   Cancer Father    Breast cancer Neg Hx     Social History: Reviewed social history from progress note on 10/23/2017. Social History   Socioeconomic History   Marital status: Married    Spouse name: ronald   Number of children: 2   Years of education: Not on file   Highest education level: Some college, no degree  Occupational  History   Not on file  Tobacco Use   Smoking status: Former    Packs/day: 0.50    Years: 30.00    Total pack years: 15.00    Types: Cigarettes    Quit date: 06/22/1995    Years since quitting: 26.5   Smokeless tobacco: Never  Vaping Use   Vaping Use: Never used  Substance and Sexual Activity   Alcohol use: Yes    Alcohol/week: 1.0 standard drink of alcohol    Types: 1 Glasses of wine per week    Comment: Occasional-rare   Drug use: No   Sexual activity: Not Currently  Other Topics Concern   Not on file  Social History Narrative   Not on file   Social Determinants of Health   Financial  Resource Strain: Low Risk  (10/23/2017)   Overall Financial Resource Strain (CARDIA)    Difficulty of Paying Living Expenses: Not hard at all  Food Insecurity: No Food Insecurity (10/23/2017)   Hunger Vital Sign    Worried About Running Out of Food in the Last Year: Never true    Ran Out of Food in the Last Year: Never true  Transportation Needs: No Transportation Needs (10/23/2017)   PRAPARE - Hydrologist (Medical): No    Lack of Transportation (Non-Medical): No  Physical Activity: Inactive (10/23/2017)   Exercise Vital Sign    Days of Exercise per Week: 0 days    Minutes of Exercise per Session: 0 min  Stress: Stress Concern Present (10/23/2017)   [place]    Feeling of Stress : Very much  Social Connections: Unknown (10/23/2017)   Social Connection and Isolation Panel [NHANES]    Frequency of Communication with Friends and Family: Not on file    Frequency of Social Gatherings with Friends and Family: Not on file    Attends Religious Services: Never    Active Member of Clubs or Organizations: No    Attends Archivist Meetings: Never    Marital Status: Married    Allergies:  Allergies  Allergen Reactions   Tape Rash    Metabolic Disorder Labs: No results found for: "HGBA1C", "MPG" No results found for: "PROLACTIN" No results found for: "CHOL", "TRIG", "HDL", "CHOLHDL", "VLDL", "LDLCALC" No results found for: "TSH"  Therapeutic Level Labs: No results found for: "LITHIUM" No results found for: "VALPROATE" No results found for: "CBMZ"  Current Medications: Current Outpatient Medications  Medication Sig Dispense Refill   acetaminophen (TYLENOL) 500 MG tablet Take by mouth.     albuterol (PROVENTIL HFA;VENTOLIN HFA) 108 (90 Base) MCG/ACT inhaler Inhale 2 puffs into the lungs every 6 (six) hours as needed.      buprenorphine (BUTRANS) 20 MCG/HR PTWK Place 1 patch onto the  skin once a week. 4 patch 2   busPIRone (BUSPAR) 7.5 MG tablet Take 1 tablet (7.5 mg total) by mouth 2 (two) times daily. 180 tablet 3   calcium carbonate (OS-CAL) 600 MG TABS tablet Take 600 mg by mouth 2 (two) times daily.     citalopram (CELEXA) 20 MG tablet Take 1.5 tablets (30 mg total) by mouth daily. 135 tablet 3   clobetasol cream (TEMOVATE) 0.05 % Apply topically.     Fluticasone-Umeclidin-Vilant (TRELEGY ELLIPTA) 100-62.5-25 MCG/INH AEPB Inhale 1 puff into the lungs daily.     GLUCOSAMINE SULFATE PO Take 1 capsule by mouth 2 (two) times daily.     levothyroxine (SYNTHROID, LEVOTHROID)  50 MCG tablet Take 50 mcg by mouth daily.     Multiple Vitamin (MULTI-VITAMINS) TABS Take 1 tablet by mouth daily.     Nebulizers MISC Inhale 1 Dose into the lungs as needed.      Potassium 99 MG TABS Take 1 tablet by mouth daily.      tolterodine (DETROL LA) 2 MG 24 hr capsule Take 2 mg by mouth daily.     traMADol (ULTRAM) 50 MG tablet Take 1 tablet (50 mg total) by mouth every 8 (eight) hours as needed for severe pain. Must last 30 days 90 tablet 2   vitamin B-12 (CYANOCOBALAMIN) 1000 MCG tablet Take 1,000 mcg by mouth daily.     No current facility-administered medications for this visit.     Musculoskeletal: Strength & Muscle Tone: within normal limits Gait & Station:  walks with walker Patient leans: Front  Psychiatric Specialty Exam: Review of Systems  Musculoskeletal:  Positive for back pain (chronic- coping well).  Psychiatric/Behavioral: Negative.    All other systems reviewed and are negative.   There were no vitals taken for this visit.There is no height or weight on file to calculate BMI.  General Appearance: Casual  Eye Contact:  Fair  Speech:  Clear and Coherent  Volume:  Normal  Mood:  Euthymic  Affect:  Congruent  Thought Process:  Goal Directed and Descriptions of Associations: Intact  Orientation:  Full (Time, Place, and Person)  Thought Content: Logical   Suicidal  Thoughts:  No  Homicidal Thoughts:  No  Memory:  Immediate;   Fair Recent;   Fair Remote;   Limited  Judgement:  Fair  Insight:  Fair  Psychomotor Activity:  Normal  Concentration:  Concentration: Fair and Attention Span: Fair  Recall:  [employer] of Knowledge: Fair  Language: Fair  Akathisia:  No  Handed:  Right  AIMS (if indicated): not done  Assets:  Communication Skills Desire for [place] Talents/Skills Transportation  ADL's:  Intact  Cognition: WNL  Sleep:  Fair   Screenings: GAD-7    Flowsheet Row Office Visit from 12/25/2021 in [facility] Office Visit from 09/03/2021 in [facility] Video Visit from 05/11/2020 in [facility]  Total GAD-7 Score 0 5 4      PHQ2-9    [facility] Office Visit from 12/25/2021 in [facility] Office Visit from 09/03/2021 in [facility] Procedure visit from 08/08/2021 in [facility] Office Visit from 06/12/2021 in [place] Office Visit from 03/06/2021 in [place] [facility]  PHQ-2 Total Score 0 0 0 1 2  PHQ-9 Total Score 1 1 -- -- 4      [place] Office Visit from 12/25/2021 in [facility] Office Visit from 09/03/2021 in [facility] ED from 08/22/2021 in [facility] No Risk No Risk No Risk        Assessment and Plan: Shakemia Madera is a 87 year old female, married, lives in an independent senior living community in [place], has a history of MDD, GAD, chronic pain, insomnia was evaluated in office today.  Patient is currently stable.  Plan as noted below.  Plan MDD in full drug patient Celexa 30 mg p.o.  daily  GAD-stable BuSpar 7.5 mg p.o. twice daily. Paxil 30 mg p.o. daily  Insomnia-stable Continue sleep hygiene  techniques Continue sufficient pain management.  High risk medication use-patient has not been able to get EKG at, agrees to get it done with her primary care provider.  Follow-up in clinic in his needed.  Patient is planning to relocate to [place].  Patient hence will need to establish care with a new provider in [place].  This note was generated in part or whole with voice recognition software. Voice recognition is usually quite accurate but there are transcription errors that can and very often do occur. I apologize for any typographical errors that were not detected and corrected.     Ursula Alert, MD 12/25/2021, 1:52 PM

## 2022-01-23 ENCOUNTER — Ambulatory Visit
Payer: Medicare Other | Attending: Student in an Organized Health Care Education/Training Program | Admitting: Student in an Organized Health Care Education/Training Program

## 2022-01-23 ENCOUNTER — Ambulatory Visit
Admission: RE | Admit: 2022-01-23 | Discharge: 2022-01-23 | Disposition: A | Discharge status: Home / Self Care | Payer: Medicare Other | Source: Ambulatory Visit | Attending: Student in an Organized Health Care Education/Training Program | Admitting: Student in an Organized Health Care Education/Training Program

## 2022-01-23 VITALS — BP 142/78 | HR 87 | Temp 97.5°F | Resp 21 | Ht 60.0 in | Wt 95.0 lb

## 2022-01-23 DIAGNOSIS — G894 Chronic pain syndrome: Secondary | ICD-10-CM | POA: Diagnosis present

## 2022-01-23 DIAGNOSIS — G5703 Lesion of sciatic nerve, bilateral lower limbs: Secondary | ICD-10-CM | POA: Diagnosis not present

## 2022-01-23 MED ORDER — ROPIVACAINE HCL 2 MG/ML IJ SOLN
INTRAMUSCULAR | Status: AC
  Filled 2022-01-23: qty 20

## 2022-01-23 MED ORDER — PENTAFLUOROPROP-TETRAFLUOROETH EX AERO
INHALATION_SPRAY | Freq: Once | CUTANEOUS | Status: DC
  Filled 2022-01-23: qty 116

## 2022-01-23 MED ORDER — IOHEXOL 180 MG/ML  SOLN
10.0000 mL | Freq: Once | INTRAMUSCULAR | Status: AC
  Administered 2022-01-23: 10 mL via INTRA_ARTICULAR
  Filled 2022-01-23: qty 20

## 2022-01-23 MED ORDER — ROPIVACAINE HCL 2 MG/ML IJ SOLN
9.0000 mL | Freq: Once | INTRAMUSCULAR | Status: AC
  Administered 2022-01-23: 9 mL via PERINEURAL

## 2022-01-23 MED ORDER — DEXAMETHASONE SODIUM PHOSPHATE 10 MG/ML IJ SOLN
10.0000 mg | Freq: Once | INTRAMUSCULAR | Status: AC
  Administered 2022-01-23: 10 mg

## 2022-01-23 MED ORDER — DEXAMETHASONE SODIUM PHOSPHATE 10 MG/ML IJ SOLN
INTRAMUSCULAR | Status: AC
  Filled 2022-01-23: qty 1

## 2022-01-23 NOTE — Progress Notes (Signed)
Safety precautions to be maintained throughout the outpatient stay will include: orient to surroundings, keep bed in low position, maintain call bell within reach at all times, provide assistance with transfer out of bed and ambulation.  

## 2022-01-23 NOTE — Progress Notes (Signed)
PROVIDER NOTE: Information contained herein reflects review and annotations entered in association with encounter. Interpretation of such information and data should be left to medically-trained personnel. Information provided to patient can be located elsewhere in the medical record under "Patient Instructions". Document created using STT-dictation technology, any transcriptional errors that may result from process are unintentional.    Patient: Julie Mckinney  Service Category: Procedure  Provider: Gillis Santa, MD  DOB: Apr 23, 1934  DOS: 01/23/2022  Location: [facility]  MRN: [id number]  Setting: Ambulatory - outpatient  Referring Provider: Gladstone Lighter, MD  Type: Established Patient  Specialty: Interventional Pain Management  PCP: Gladstone Lighter, MD   Primary Reason for Visit: Interventional Pain Management Treatment. CC: Back Pain   Procedure:          Anesthesia, Analgesia, Anxiolysis:  Type: piriformis injection TPI under fluoroscopy  Region: Inferior Lumbosacral Region Level: PIIS (Posterior Inferior Iliac Spine) Laterality: Bilateral  Type: Local Anesthesia  Local Anesthetic: Lidocaine 1-2%  Position: Prone           Indications: 1. Piriformis syndrome of both sides   2. Chronic pain syndrome     Pain Score: Pre-procedure: 8 /10 Post-procedure: 0-No pain/10   Pre-op H&P Assessment:  Julie Mckinney is a 88 y.o. (year old), female patient, seen today for interventional treatment. She  has a past surgical history that includes Appendectomy; Colonoscopy; Cholecystectomy; Abdominal hysterectomy; Esophagogastroduodenoscopy (egd) with propofol (N/A, 08/14/2016); Colonoscopy with propofol (N/A, 08/14/2016); Back surgery; Breast biopsy (Right, 2015); Breast excisional biopsy (Right, 1975); and Breast lumpectomy (Right, 2015). Julie Mckinney has a current medication list which includes the following prescription(s): acetaminophen, albuterol, buprenorphine,  buspirone, calcium carbonate, citalopram, clobetasol cream, trelegy ellipta, glucosamine sulfate, levothyroxine, multi-vitamins, nebulizers, potassium, tolterodine, tramadol, and cyanocobalamin, and the following Facility-Administered Medications: pentafluoroprop-tetrafluoroeth. Her primarily concern today is the Back Pain   Initial Vital Signs:  Pulse/HCG Rate: 87ECG Heart Rate: 74 Temp: (!) 97.5 F (36.4 C) Resp: 16 BP: 133/66 SpO2: 97 %  BMI: Estimated body mass index is 18.55 kg/m as calculated from the following:   Height as of this encounter: 5' (1.524 m).   Weight as of this encounter: 95 lb (43.1 kg).  Risk Assessment: Allergies: Reviewed. She is allergic to tape.  Allergy Precautions: None required Coagulopathies: Reviewed. None identified.  Blood-thinner therapy: None at this time Active Infection(s): Reviewed. None identified. Julie Mckinney is afebrile  Site Confirmation: Julie Mckinney was asked to confirm the procedure and laterality before marking the site Procedure checklist: Completed Consent: Before the procedure and under the influence of no sedative(s), amnesic(s), or anxiolytics, the patient was informed of the treatment options, risks and possible complications. To fulfill our ethical and legal obligations, as recommended by the American Medical Association's Code of Ethics, I have informed the patient of my clinical impression; the nature and purpose of the treatment or procedure; the risks, benefits, and possible complications of the intervention; the alternatives, including doing nothing; the risk(s) and benefit(s) of the alternative treatment(s) or procedure(s); and the risk(s) and benefit(s) of doing nothing. The patient was provided information about the general risks and possible complications associated with the procedure. These may include, but are not limited to: failure to achieve desired goals, infection, bleeding, organ or nerve damage, allergic reactions,  paralysis, and death. In addition, the patient was informed of those risks and complications associated to the procedure, such as failure to decrease pain; infection; bleeding; organ or nerve damage with subsequent damage to sensory, motor, and/or autonomic  systems, resulting in permanent pain, numbness, and/or weakness of one or several areas of the body; allergic reactions; (i.e.: anaphylactic reaction); and/or death. Furthermore, the patient was informed of those risks and complications associated with the medications. These include, but are not limited to: allergic reactions (i.e.: anaphylactic or anaphylactoid reaction(s)); adrenal axis suppression; blood sugar elevation that in diabetics may result in ketoacidosis or comma; water retention that in patients with history of congestive heart failure may result in shortness of breath, pulmonary edema, and decompensation with resultant heart failure; weight gain; swelling or edema; medication-induced neural toxicity; particulate matter embolism and blood vessel occlusion with resultant organ, and/or nervous system infarction; and/or aseptic necrosis of one or more joints. Finally, the patient was informed that Medicine is not an exact science; therefore, there is also the possibility of unforeseen or unpredictable risks and/or possible complications that may result in a catastrophic outcome. The patient indicated having understood very clearly. We have given the patient no guarantees and we have made no promises. Enough time was given to the patient to ask questions, all of which were answered to the patient's satisfaction. Julie Mckinney has indicated that she wanted to continue with the procedure. Attestation: I, the ordering provider, attest that I have discussed with the patient the benefits, risks, side-effects, alternatives, likelihood of achieving goals, and potential problems during recovery for the procedure that I have provided informed consent. Date   Time: 01/23/2022 10:48 AM  Pre-Procedure Preparation:  Monitoring: As per clinic protocol. Respiration, ETCO2, SpO2, BP, heart rate and rhythm monitor placed and checked for adequate function Safety Precautions: Patient was assessed for positional comfort and pressure points before starting the procedure. Time-out: I initiated and conducted the "Time-out" before starting the procedure, as per protocol. The patient was asked to participate by confirming the accuracy of the "Time Out" information. Verification of the correct person, site, and procedure were performed and confirmed by me, the nursing staff, and the patient. "Time-out" conducted as per Joint Commission's Universal Protocol (UP.01.01.01). Time: 1112  Description of Procedure:          Target Area: Inferior, posterior, aspect of the sacroiliac fissure Approach: Posterior, paraspinal, ipsilateral approach. Area Prepped: Entire Lower Lumbosacral Region DuraPrep (Iodine Povacrylex [0.7% available iodine] and Isopropyl Alcohol, 74% w/w) Safety Precautions: Aspiration looking for blood return was conducted prior to all injections. At no point did we inject any substances, as a needle was being advanced. No attempts were made at seeking any paresthesias. Safe injection practices and needle disposal techniques used. Medications properly checked for expiration dates. SDV (single dose vial) medications used. Description of the Procedure: Protocol guidelines were followed. The patient was placed in position over the procedure table. The target area was identified and the area prepped in the usual manner. Skin & deeper tissues infiltrated with local anesthetic. Appropriate amount of time allowed to pass for local anesthetics to take effect. The procedure needle was advanced under fluoroscopic guidance into the sacroiliac joint until a firm endpoint was obtained. Proper needle placement secured. Negative aspiration confirmed. Solution injected in  intermittent fashion, asking for systemic symptoms every 0.5cc of injectate. The needles were then removed and the area cleansed, making sure to leave some of the prepping solution back to take advantage of its long term bactericidal properties. Vitals:   01/23/22 1049 01/23/22 1108 01/23/22 1114 01/23/22 1117  BP: 133/66 (!) 170/77 (!) 145/78 (!) 142/78  Pulse: 87     Resp: '16 19 20 '$ (!) 21  Temp: Marland Kitchen)  97.5 F (36.4 C)     SpO2: 97% 99% 96% 98%  Weight: 95 lb (43.1 kg)     Height: 5' (1.524 m)       Start Time: 1112 hrs. End Time: 1117 hrs. Materials:  Needle(s) Type: Spinal Needle Gauge: 25G Length: 3.5-in Medication(s):   a left piriformis trigger point injection was done 1 cm inferior, 1 cm deep, 1 cm lateral to the inferior fissure of the SI joint.   a right piriformis trigger point injection was done 1 cm inferior, 1 cm deep, 1 cm lateral to the inferior fissure of the SI joint  Contrast was injected to confirm piriformis muscle striation.  While injecting, patient did not complain of any pain radiating down her leg.  10 cc solution made of 9 cc of 0.2% ropivacaine, 1 cc of Decadron 10 mg/cc.  5 cc injected for the left piriformis, 5 cc injected for the right piriformis   Imaging Guidance (Non-Spinal):          Type of Imaging Technique: Fluoroscopy Guidance (Non-Spinal) Indication(s): Assistance in needle guidance and placement for procedures requiring needle placement in or near specific anatomical locations not easily accessible without such assistance. Exposure Time: Please see nurses notes. Contrast: Before injecting any contrast, we confirmed that the patient did not have an allergy to iodine, shellfish, or radiological contrast. Once satisfactory needle placement was completed at the desired level, radiological contrast was injected. Contrast injected under live fluoroscopy. No contrast complications. See chart for type and volume of contrast used. Fluoroscopic Guidance: I  was personally present during the use of fluoroscopy. "Tunnel Vision Technique" used to obtain the best possible view of the target area. Parallax error corrected before commencing the procedure. "Direction-depth-direction" technique used to introduce the needle under continuous pulsed fluoroscopy. Once target was reached, antero-posterior, oblique, and lateral fluoroscopic projection used confirm needle placement in all planes. Images permanently stored in EMR. Interpretation: I personally interpreted the imaging intraoperatively. Adequate needle placement confirmed in multiple planes. Appropriate spread of contrast into desired area was observed. No evidence of afferent or efferent intravascular uptake. Permanent images saved into the patient's record.   Post-operative Assessment:  Post-procedure Vital Signs:  Pulse/HCG Rate: 8767 Temp: (!) 97.5 F (36.4 C) Resp: (!) 21 BP: (!) 142/78 SpO2: 98 %  EBL: None  Complications: No immediate post-treatment complications observed by team, or reported by patient.  Note: The patient tolerated the entire procedure well. A repeat set of vitals were taken after the procedure and the patient was kept under observation following institutional policy, for this type of procedure. Post-procedural neurological assessment was performed, showing return to baseline, prior to discharge. The patient was provided with post-procedure discharge instructions, including a section on how to identify potential problems. Should any problems arise concerning this procedure, the patient was given instructions to immediately contact us, at any time, without hesitation. In any case, we plan to contact the patient by telephone for a follow-up status report regarding this interventional procedure.  Comments:  No additional relevant information.  Plan of Care  Orders:  Orders Placed This Encounter  Procedures   DG PAIN CLINIC C-ARM 1-60 MIN NO REPORT    Intraoperative  interpretation by procedural physician at [facility].    Standing Status:   Standing    Number of Occurrences:   1    Order Specific Question:   Reason for exam:    Answer:   Assistance in needle guidance and placement for procedures  requiring needle placement in or near specific anatomical locations not easily accessible without such assistance.   Chronic Opioid Analgesic:  Butrans patch 22mg/hr, continue tramadol 50 mg every 8 hours as needed    Medications ordered for procedure: Meds ordered this encounter  Medications   iohexol (OMNIPAQUE) 180 MG/ML injection 10 mL    Must be Myelogram-compatible. If not available, you may substitute with a water-soluble, non-ionic, hypoallergenic, myelogram-compatible radiological contrast medium.   pentafluoroprop-tetrafluoroeth (GEBAUERS) aerosol   dexamethasone (DECADRON) injection 10 mg   ropivacaine (PF) 2 mg/mL (0.2%) (NAROPIN) injection 9 mL   Medications administered: We administered iohexol, dexamethasone, and ropivacaine (PF) 2 mg/mL (0.2%).  See the medical record for exact dosing, route, and time of administration.  Follow-up plan:   Return in about 4 weeks (around 02/20/2022) for Post Procedure Evaluation, virtual.     Recent Visits Date Type Provider Dept  12/18/21 Office Visit LGillis Santa MD [facility]  12/10/21 Office Visit LGillis Santa MD [facility]  12/05/21 Office Visit LGillis Santa MD [facility]  10/29/21 Procedure visit LGillis Santa MD [facility]  Showing recent visits within past 90 days and meeting all other requirements Today's Visits Date Type Provider Dept  01/23/22 Procedure visit LGillis Santa MD [facility]  Showing today's visits and meeting all other requirements Future Appointments Date Type Provider Dept  03/14/22 Appointment LGillis Santa MD [facility]  Showing future appointments within next 90 days and meeting all  other requirements  Disposition: Discharge home  Discharge (Date  Time): 01/23/2022;   hrs.   Primary Care Physician: KGladstone Lighter MD [facility] Note by: BGillis Santa MD Date: 01/23/2022; Time: 11:21 AM  Disclaimer:  Medicine is not an exact science. The only guarantee in medicine is that nothing is guaranteed. It is important to note that the decision to proceed with this intervention was based on the information collected from the patient. The Data and conclusions were drawn from the patient's questionnaire, the interview, and the physical examination. Because the information was provided in large part by the patient, it cannot be guaranteed that it has not been purposely or unconsciously manipulated. Every effort has been made to obtain as much relevant data as possible for this evaluation. It is important to note that the conclusions that lead to this procedure are derived in large part from the available data. Always take into account that the treatment will also be dependent on availability of resources and existing treatment guidelines, considered by other Pain Management Practitioners as being common knowledge and practice, at the time of the intervention. For Medico-Legal purposes, it is also important to point out that variation in procedural techniques and pharmacological choices are the acceptable norm. The indications, contraindications, technique, and results of the above procedure should only be interpreted and judged by a Board-Certified Interventional Pain Specialist with extensive familiarity and expertise in the same exact procedure and technique.

## 2022-01-23 NOTE — Patient Instructions (Signed)

## 2022-01-24 ENCOUNTER — Telehealth: Payer: Self-pay

## 2022-01-24 NOTE — Telephone Encounter (Signed)
Post procedure follow up.  LM 

## 2022-02-19 ENCOUNTER — Telehealth: Payer: Self-pay

## 2022-02-19 NOTE — Telephone Encounter (Signed)
LM to call office for pre virtual appointment

## 2022-02-20 ENCOUNTER — Ambulatory Visit
Payer: Medicare Other | Attending: Student in an Organized Health Care Education/Training Program | Admitting: Student in an Organized Health Care Education/Training Program

## 2022-02-20 DIAGNOSIS — G5703 Lesion of sciatic nerve, bilateral lower limbs: Secondary | ICD-10-CM

## 2022-02-20 DIAGNOSIS — G894 Chronic pain syndrome: Secondary | ICD-10-CM

## 2022-02-20 NOTE — Progress Notes (Signed)
I attempted to call the patient however no response. Voicemail left instructing patient to call front desk office at [phone] to reschedule appointment. -Dr Makita Blow  

## 2022-02-22 ENCOUNTER — Ambulatory Visit
Admission: RE | Admit: 2022-02-22 | Discharge: 2022-02-22 | Disposition: A | Discharge status: Home / Self Care | Payer: Medicare Other | Source: Ambulatory Visit | Attending: Internal Medicine | Admitting: Internal Medicine

## 2022-02-22 DIAGNOSIS — Z1231 Encounter for screening mammogram for malignant neoplasm of breast: Secondary | ICD-10-CM | POA: Diagnosis present

## 2022-02-26 ENCOUNTER — Telehealth: Payer: Self-pay

## 2022-02-26 NOTE — Telephone Encounter (Signed)
Attempted to call patient for PPE appt on 02/27/22. No answer, left message to call office to review before appt.

## 2022-02-27 ENCOUNTER — Ambulatory Visit
Payer: Medicare Other | Attending: Student in an Organized Health Care Education/Training Program | Admitting: Student in an Organized Health Care Education/Training Program

## 2022-02-27 DIAGNOSIS — G5703 Lesion of sciatic nerve, bilateral lower limbs: Secondary | ICD-10-CM

## 2022-02-27 DIAGNOSIS — G894 Chronic pain syndrome: Secondary | ICD-10-CM | POA: Diagnosis not present

## 2022-02-27 NOTE — Progress Notes (Signed)
Patient: Julie Mckinney  Service Category: E/M  Provider: Gillis Santa, MD  DOB: 1934-12-03  DOS: 02/27/2022  Location: Office  MRN: [id number]  Setting: Ambulatory outpatient  Referring Provider: Gladstone Lighter, MD  Type: Established Patient  Specialty: Interventional Pain Management  PCP: Gladstone Lighter, MD  Location: Remote location  Delivery: TeleHealth     Virtual Encounter - Pain Management PROVIDER NOTE: Information contained herein reflects review and annotations entered in association with encounter. Interpretation of such information and data should be left to medically-trained personnel. Information provided to patient can be located elsewhere in the medical record under "Patient Instructions". Document created using STT-dictation technology, any transcriptional errors that may result from process are unintentional.    Contact & Pharmacy Preferred: [phone] Home: [phone] (home) Mobile: [phone] (mobile) E-mail: [email or web address], [place] - [address] [place] [address] Phone: [phone] Fax: [phone]   Pre-screening  [name] offered "in-person" vs "virtual" encounter. She indicated preferring virtual for this encounter.   Reason COVID-19*  Social distancing based on CDC and AMA recommendations.   I contacted Maripat Borba on 02/27/2022 via telephone.      I clearly identified myself as Gillis Santa, MD. I verified that I was speaking with the correct person using two identifiers (Name: Taylen Wendland, and date of birth: [birth year]).  Consent I sought verbal advanced consent from [name] for virtual visit interactions. I informed Ms. Dunkel of possible security and privacy concerns, risks, and limitations associated with providing "not-in-person" medical evaluation and management services. I also informed Ms. Abdelrahman of the availability of "in-person"  appointments. Finally, I informed her that there would be a charge for the virtual visit and that she could be  personally, fully or partially, financially responsible for it. Ms. Culton expressed understanding and agreed to proceed.   Historic Elements   Ms. Delitha Elms is a 88 y.o. year old, female patient evaluated today after our last contact on 12/10/2021. Ms. Enright  has a past medical history of Anxiety, Asthma, Breast cancer ([place]) (Right), COPD (chronic obstructive pulmonary disease) ([place]), Depression, and Personal history of radiation therapy. She also  has a past surgical history that includes Appendectomy; Colonoscopy; Cholecystectomy; Abdominal hysterectomy; Esophagogastroduodenoscopy (egd) with propofol (N/A, 08/14/2016); Colonoscopy with propofol (N/A, 08/14/2016); Back surgery; Breast biopsy (Right, 2015); Breast excisional biopsy (Right, 1975); and Breast lumpectomy (Right, 2015). Ms. Casamento has a current medication list which includes the following prescription(s): acetaminophen, albuterol, buprenorphine, buspirone, calcium carbonate, citalopram, clobetasol cream, trelegy ellipta, glucosamine sulfate, levothyroxine, multi-vitamins, nebulizers, potassium, tolterodine, tramadol, and cyanocobalamin. She  reports that she quit smoking about 26 years ago. Her smoking use included cigarettes. She has a 15.00 pack-year smoking history. She has never used smokeless tobacco. She reports current alcohol use of about 1.0 standard drink of alcohol per week. She reports that she does not use drugs. Ms. Escamilla is allergic to tape.  Estimated body mass index is 18.55 kg/m as calculated from the following:   Height as of 01/23/22: 5' (1.524 m).   Weight as of 01/23/22: 95 lb (43.1 kg).  HPI  Today, she is being contacted for a post-procedure assessment.   Post-procedure evaluation  Bilateral Piriformis TPI  Effectiveness:  Initial hour after procedure: 100% Subsequent 4-6 hours  post-procedure: 100% Analgesia past initial 6 hours: 100% Ongoing improvement:  Analgesic:  100%    Pharmacotherapy Assessment   Opioid Analgesic: Butrans patch 40mg/hr, continue tramadol 50 mg every 8 hours  as needed    Monitoring: [place] PMP: PDMP not reviewed this encounter.       Pharmacotherapy: No side-effects or adverse reactions reported. Compliance: No problems identified. Effectiveness: Clinically acceptable. Plan: Refer to "POC". UDS: No results found for: "SUMMARY" No results found for: "CBDTHCR", "D8THCCBX", "D9THCCBX"   Laboratory Chemistry Profile   Renal Lab Results  Component Value Date   BUN 27 (H) 01/19/2019   CREATININE 0.56 01/19/2019   GFRAA >60 01/19/2019   GFRNONAA >60 01/19/2019    Hepatic Lab Results  Component Value Date   AST 20 01/17/2019   ALT 18 01/17/2019   ALBUMIN 3.5 01/17/2019   ALKPHOS 34 (L) 01/17/2019    Electrolytes Lab Results  Component Value Date   NA 138 03/07/2021   K 4.0 01/19/2019   CL 111 01/19/2019   CALCIUM 8.5 (L) 01/19/2019    Bone No results found for: "VD25OH", "VD125OH2TOT", "NL9767HA1", "PF7902IO9", "25OHVITD1", "25OHVITD2", "25OHVITD3", "TESTOFREE", "TESTOSTERONE"  Inflammation (CRP: Acute Phase) (ESR: Chronic Phase) No results found for: "CRP", "ESRSEDRATE", "LATICACIDVEN"       Note: Above Lab results reviewed.   Assessment  The primary encounter diagnosis was Piriformis syndrome of both sides. A diagnosis of Chronic pain syndrome was also pertinent to this visit.  Plan of Care    Ms. Keilyn Haggard has a current medication list which includes the following long-term medication(s): albuterol, calcium carbonate, citalopram, levothyroxine, and potassium.  1. Piriformis syndrome of both sides  2. Chronic pain syndrome   Patient doing very well after b/l piriformis TPI, 100% pain relief and improvement in performing ADLS Repeat PRN Keep follow up as scheduled for MM 2/22  Pharmacotherapy  (Medications Ordered): No orders of the defined types were placed in this encounter.  Orders:  No orders of the defined types were placed in this encounter.  Follow-up plan:   Return for Keep sch. appt.    Recent Visits Date Type Provider Dept  02/20/22 Office Visit Gillis Santa, MD [facility]  01/23/22 Procedure visit Gillis Santa, MD [facility]  12/18/21 Office Visit Gillis Santa, MD [facility]  12/10/21 Office Visit Gillis Santa, MD [facility]  12/05/21 Office Visit Gillis Santa, MD [facility]  Showing recent visits within past 90 days and meeting all other requirements Today's Visits Date Type Provider Dept  02/27/22 Office Visit Gillis Santa, MD [facility]  Showing today's visits and meeting all other requirements Future Appointments Date Type Provider Dept  03/14/22 Appointment Gillis Santa, MD [facility]  Showing future appointments within next 90 days and meeting all other requirements  I discussed the assessment and treatment plan with the patient. The patient was provided an opportunity to ask questions and all were answered. The patient agreed with the plan and demonstrated an understanding of the instructions.  Patient advised to call back or seek an in-person evaluation if the symptoms or condition worsens.  Duration of encounter: 30 minutes.  Note by: Gillis Santa, MD Date: 02/27/2022; Time: 10:45 AM

## 2022-03-14 ENCOUNTER — Encounter: Payer: Self-pay | Admitting: Student in an Organized Health Care Education/Training Program

## 2022-03-14 ENCOUNTER — Ambulatory Visit
Payer: Medicare Other | Attending: Student in an Organized Health Care Education/Training Program | Admitting: Student in an Organized Health Care Education/Training Program

## 2022-03-14 VITALS — BP 132/78 | HR 86 | Temp 98.1°F | Resp 16 | Ht 61.0 in | Wt 95.0 lb

## 2022-03-14 DIAGNOSIS — M79672 Pain in left foot: Secondary | ICD-10-CM | POA: Diagnosis not present

## 2022-03-14 DIAGNOSIS — G5702 Lesion of sciatic nerve, left lower limb: Secondary | ICD-10-CM | POA: Diagnosis present

## 2022-03-14 DIAGNOSIS — G5703 Lesion of sciatic nerve, bilateral lower limbs: Secondary | ICD-10-CM | POA: Diagnosis present

## 2022-03-14 DIAGNOSIS — M792 Neuralgia and neuritis, unspecified: Secondary | ICD-10-CM

## 2022-03-14 DIAGNOSIS — M961 Postlaminectomy syndrome, not elsewhere classified: Secondary | ICD-10-CM

## 2022-03-14 DIAGNOSIS — G894 Chronic pain syndrome: Secondary | ICD-10-CM

## 2022-03-14 MED ORDER — TRAMADOL HCL 50 MG PO TABS: 50.0000 mg | ORAL_TABLET | Freq: Four times a day (QID) | ORAL | 2 refills | Status: DC | PRN

## 2022-03-14 NOTE — Progress Notes (Signed)
Nursing Pain Medication Assessment:  Safety precautions to be maintained throughout the outpatient stay will include: orient to surroundings, keep bed in low position, maintain call bell within reach at all times, provide assistance with transfer out of bed and ambulation.  Medication Inspection Compliance: Pill count conducted under aseptic conditions, in front of the patient. Neither the pills nor the bottle was removed from the patient's sight at any time. Once count was completed pills were immediately returned to the patient in their original bottle.  Medication: Tramadol (Ultram) Pill/Patch Count:  17 of 90 pills remain Pill/Patch Appearance: Markings consistent with prescribed medication Bottle Appearance: Standard pharmacy container. Clearly labeled. Filled Date: 01 / 26 / 2024 Last Medication intake:  Today

## 2022-03-14 NOTE — Progress Notes (Signed)
PROVIDER NOTE: Information contained herein reflects review and annotations entered in association with encounter. Interpretation of such information and data should be left to medically-trained personnel. Information provided to patient can be located elsewhere in the medical record under "Patient Instructions". Document created using STT-dictation technology, any transcriptional errors that may result from process are unintentional.    Patient: Julie Mckinney  Service Category: E/M  Provider: Gillis Santa, MD  DOB: [birth year]  DOS: 03/14/2022  Referring Provider: Gladstone Lighter, MD  MRN: [id number]  Specialty: Interventional Pain Management  PCP: Gladstone Lighter, MD  Type: Established Patient  Setting: Ambulatory outpatient    Location: Office  Delivery: Face-to-face     HPI  Julie Mckinney, a 88 y.o. year old female, is here today because of her Piriformis syndrome of both sides [G57.03]. Julie Mckinney's primary complain today is Back Pain ([place]) Last encounter: My last encounter with her was on 02/27/2022. Pertinent problems: Julie Mckinney has History of CVA (cerebrovascular accident) without residual deficits; MDD (major depressive disorder), recurrent episode, mild ([place]); T10 vertebral fracture ([place]); Closed stable burst fracture of T12 vertebra ([place]); Lumbar post-laminectomy syndrome; MDD (major depressive disorder), recurrent, in full remission ([place]); Pain management contract signed; and Chronic pain syndrome on their pertinent problem list. Pain Assessment: Severity of Chronic pain is reported as a 7 /10. Location: Back Lower, Left/into the buttocks and down the left leg. Onset: More than a month ago. Quality: Constant, Discomfort, Aching. Timing: Constant. Modifying factor(s): rest and medications. Vitals:  height is 5' 1"$  (1.549 m) and weight is 95 lb (43.1 kg). Her temporal temperature is 98.1 F (36.7 C). Her blood pressure is 132/78 and her pulse is 86. Her respiration is 16  and oxygen saturation is 97%.  BMI: Estimated body mass index is 17.95 kg/m as calculated from the following:   Height as of this encounter: 5' 1"$  (1.549 m).   Weight as of this encounter: 95 lb (43.1 kg).  Reason for encounter: both, medication management and post-procedure evaluation and assessment.  Good relief after piriformis TPI Patient moving to [place] to be closer to her son and niece  Last Rx of Tramadol today, increase to every 6-8 hrs prn given discontinuation of Butrans Pt states that she discontinued her Butrans due to lack of benefit, has not noticed an increase in her pain since stopping Informed patient that I am available virtually for a 2nd opinion or any consultative needs going forward    Pharmacotherapy Assessment  Analgesic:  tramadol 50 mg every 6- 8 hours as needed    Monitoring:  PMP: PDMP reviewed during this encounter.       Pharmacotherapy: No side-effects or adverse reactions reported. Compliance: No problems identified. Effectiveness: Clinically acceptable.  Janett Billow, RN  03/14/2022  1:14 PM  Sign when Signing Visit Nursing Pain Medication Assessment:  Safety precautions to be maintained throughout the outpatient stay will include: orient to surroundings, keep bed in low position, maintain call bell within reach at all times, provide assistance with transfer out of bed and ambulation.  Medication Inspection Compliance: Pill count conducted under aseptic conditions, in front of the patient. Neither the pills nor the bottle was removed from the patient's sight at any time. Once count was completed pills were immediately returned to the patient in their original bottle.  Medication: Tramadol (Ultram) Pill/Patch Count:  17 of 90 pills remain Pill/Patch Appearance: Markings consistent with prescribed medication Bottle Appearance: Standard pharmacy container. Clearly labeled. Filled Date: 62 / 48 /  2024 Last Medication intake:  Today    No  results found for: "CBDTHCR" No results found for: "D8THCCBX" No results found for: "D9THCCBX"  UDS:  No results found for: "SUMMARY"    ROS  Constitutional: Denies any fever or chills Gastrointestinal: No reported hemesis, hematochezia, vomiting, or acute GI distress Musculoskeletal:  low back pain Neurological: No reported episodes of acute onset apraxia, aphasia, dysarthria, agnosia, amnesia, paralysis, loss of coordination, or loss of consciousness  Medication Review  Fluticasone-Umeclidin-Vilant, Glucosamine Sulfate, Multi-Vitamins, Nebulizers, Potassium, acetaminophen, albuterol, busPIRone, calcium carbonate, citalopram, cyanocobalamin, levothyroxine, tolterodine, and traMADol  History Review  Allergy: Julie Mckinney is allergic to tape. Drug: Julie Mckinney  reports no history of drug use. Alcohol:  reports current alcohol use of about 1.0 standard drink of alcohol per week. Tobacco:  reports that she quit smoking about 26 years ago. Her smoking use included cigarettes. She has a 15.00 pack-year smoking history. She has never used smokeless tobacco. Social: Julie Mckinney  reports that she quit smoking about 26 years ago. Her smoking use included cigarettes. She has a 15.00 pack-year smoking history. She has never used smokeless tobacco. She reports current alcohol use of about 1.0 standard drink of alcohol per week. She reports that she does not use drugs. Medical:  has a past medical history of Anxiety, Asthma, Breast cancer ([place]) (Right), COPD (chronic obstructive pulmonary disease) ([place]), Depression, and Personal history of radiation therapy. Surgical: Julie Mckinney  has a past surgical history that includes Appendectomy; Colonoscopy; Cholecystectomy; Abdominal hysterectomy; Esophagogastroduodenoscopy (egd) with propofol (N/A, 08/14/2016); Colonoscopy with propofol (N/A, 08/14/2016); Back surgery; Breast biopsy (Right, 2015); Breast excisional biopsy (Right, 1975); and Breast lumpectomy  (Right, 2015). Family: family history includes Cancer in her father.  Laboratory Chemistry Profile   Renal Lab Results  Component Value Date   BUN 27 (H) 01/19/2019   CREATININE 0.56 01/19/2019   GFRAA >60 01/19/2019   GFRNONAA >60 01/19/2019    Hepatic Lab Results  Component Value Date   AST 20 01/17/2019   ALT 18 01/17/2019   ALBUMIN 3.5 01/17/2019   ALKPHOS 34 (L) 01/17/2019    Electrolytes Lab Results  Component Value Date   NA 138 03/07/2021   K 4.0 01/19/2019   CL 111 01/19/2019   CALCIUM 8.5 (L) 01/19/2019    Bone No results found for: "VD25OH", "VD125OH2TOT", "[id number]", "[id number]", "25OHVITD1", "25OHVITD2", "25OHVITD3", "TESTOFREE", "TESTOSTERONE"  Inflammation (CRP: Acute Phase) (ESR: Chronic Phase) No results found for: "CRP", "ESRSEDRATE", "LATICACIDVEN"       Note: Above Lab results reviewed.  Recent Imaging Review  MM 3D SCREEN BREAST BILATERAL CLINICAL DATA:  Screening.  EXAM: DIGITAL SCREENING BILATERAL MAMMOGRAM WITH TOMOSYNTHESIS AND CAD  TECHNIQUE: Bilateral screening digital craniocaudal and mediolateral oblique mammograms were obtained. Bilateral screening digital breast tomosynthesis was performed. The images were evaluated with computer-aided detection.  COMPARISON:  Previous exam(s).  ACR Breast Density Category c: The breast tissue is heterogeneously dense, which may obscure small masses.  FINDINGS: There are no findings suspicious for malignancy.  IMPRESSION: No mammographic evidence of malignancy. A result letter of this screening mammogram will be mailed directly to the patient.  RECOMMENDATION: Screening mammogram in one year. (Code:SM-B-01Y)  BI-RADS CATEGORY  1: Negative.  Electronically Signed   By: Lovey Newcomer M.D.   On: 02/26/2022 12:48 Note: Reviewed        Physical Exam  General appearance: Well nourished, well developed, and well hydrated. In no apparent acute distress Mental status: Alert, oriented x 3  (  person, place, & time)       Respiratory: No evidence of acute respiratory distress Eyes: PERLA Vitals: BP 132/78 (BP Location: Right Arm, Patient Position: Sitting, Cuff Size: Normal)   Pulse 86   Temp 98.1 F (36.7 C) (Temporal)   Resp 16   Ht 5' 1"$  (1.549 m)   Wt 95 lb (43.1 kg)   SpO2 97%   BMI 17.95 kg/m  BMI: Estimated body mass index is 17.95 kg/m as calculated from the following:   Height as of this encounter: 5' 1"$  (1.549 m).   Weight as of this encounter: 95 lb (43.1 kg). Ideal: Female patients must weigh at least 45.5 kg to calculate ideal body weight  Improvement in piriformis pain after TPI  Assessment   Diagnosis Status  1. Piriformis syndrome of both sides   2. Piriformis syndrome of left side   3. Intractable neuropathic pain of left foot   4. Lumbar post-laminectomy syndrome   5. Failed back surgical syndrome   6. Chronic pain syndrome    Controlled Controlled Controlled    Plan of Care   Julie Mckinney has a current medication list which includes the following long-term medication(s): albuterol, calcium carbonate, citalopram, levothyroxine, and potassium.  Pharmacotherapy (Medications Ordered): Meds ordered this encounter  Medications   traMADol (ULTRAM) 50 MG tablet    Sig: Take 1 tablet (50 mg total) by mouth every 6 (six) hours as needed for severe pain. Must last 30 days    Dispense:  120 tablet    Refill:  2    Chronic Pain: STOP Act (Not applicable) Fill 1 day early if closed on refill date. Avoid benzodiazepines within 8 hours of opioids   Orders:  No orders of the defined types were placed in this encounter.  Follow-up plan:   Return if symptoms worsen or fail to improve.      Left caudal and left pirformis TPI 09/12/21     Recent Visits Date Type Provider Dept  02/27/22 Office Visit Gillis Santa, MD [facility]  02/20/22 Office Visit Gillis Santa, MD [facility]  01/23/22 Procedure visit Gillis Santa, MD [facility]  12/18/21 Office Visit Gillis Santa, MD [facility]  Showing recent visits within past 90 days and meeting all other requirements Today's Visits Date Type Provider Dept  03/14/22 Office Visit Gillis Santa, MD [facility]  Showing today's visits and meeting all other requirements Future Appointments No visits were found meeting these conditions. Showing future appointments within next 90 days and meeting all other requirements  I discussed the assessment and treatment plan with the patient. The patient was provided an opportunity to ask questions and all were answered. The patient agreed with the plan and demonstrated an understanding of the instructions.  Patient advised to call back or seek an in-person evaluation if the symptoms or condition worsens.  Duration of encounter: 40mnutes.  Total time on encounter, as per AMA guidelines included both the face-to-face and non-face-to-face time personally spent by the physician and/or other qualified health care professional(s) on the day of the encounter (includes time in activities that require the physician or other qualified health care professional and does not include time in activities normally performed by clinical staff). Physician's time may include the following activities when performed: Preparing to see the patient (e.g., pre-charting review of records, searching for previously ordered imaging, lab work, and nerve conduction tests) Review of prior analgesic pharmacotherapies. Reviewing PMP Interpreting ordered tests (  e.g., lab work, imaging, nerve conduction tests) Performing post-procedure evaluations, including interpretation of diagnostic procedures Obtaining and/or reviewing separately obtained history Performing a medically appropriate examination and/or evaluation Counseling and educating the patient/family/caregiver Ordering medications, tests, or procedures Referring and  communicating with other health care professionals (when not separately reported) Documenting clinical information in the electronic or other health record Independently interpreting results (not separately reported) and communicating results to the patient/ family/caregiver Care coordination (not separately reported)  Note by: Gillis Santa, MD Date: 03/14/2022; Time: 2:09 PM

## 2022-03-20 ENCOUNTER — Telehealth: Payer: Self-pay | Admitting: Student in an Organized Health Care Education/Training Program

## 2022-03-20 NOTE — Telephone Encounter (Signed)
Message sent to Dr Holley Raring that patient would need a hard copy of her script.

## 2022-03-20 NOTE — Telephone Encounter (Signed)
PT stated that pharmacy doesn't have her tramadol prescription. PT stated that she needs medications to be send in , she will be going out of town. Please give patient a call. TY

## 2022-03-21 ENCOUNTER — Other Ambulatory Visit: Payer: Self-pay

## 2022-03-21 DIAGNOSIS — M961 Postlaminectomy syndrome, not elsewhere classified: Secondary | ICD-10-CM

## 2022-03-21 DIAGNOSIS — G894 Chronic pain syndrome: Secondary | ICD-10-CM

## 2022-03-21 DIAGNOSIS — G5703 Lesion of sciatic nerve, bilateral lower limbs: Secondary | ICD-10-CM

## 2022-03-21 DIAGNOSIS — M792 Neuralgia and neuritis, unspecified: Secondary | ICD-10-CM

## 2022-03-21 DIAGNOSIS — G5702 Lesion of sciatic nerve, left lower limb: Secondary | ICD-10-CM

## 2022-03-21 MED ORDER — TRAMADOL HCL 50 MG PO TABS: 50.0000 mg | ORAL_TABLET | Freq: Four times a day (QID) | ORAL | 2 refills | Status: AC | PRN
